# Patient Record
Sex: Male | Born: 1975 | Race: White | Hispanic: No | Smoking: Current some day smoker
Health system: Southern US, Community
[De-identification: ages and names within clinical notes are randomized; demographics above are authoritative.]

## PROBLEM LIST (undated history)

## (undated) DIAGNOSIS — I639 Cerebral infarction, unspecified: Secondary | ICD-10-CM

## (undated) DIAGNOSIS — F419 Anxiety disorder, unspecified: Secondary | ICD-10-CM

## (undated) DIAGNOSIS — I10 Essential (primary) hypertension: Secondary | ICD-10-CM

## (undated) DIAGNOSIS — G809 Cerebral palsy, unspecified: Secondary | ICD-10-CM

## (undated) HISTORY — PX: TONSILLECTOMY: SUR1361

---

## 2004-09-22 ENCOUNTER — Emergency Department: Payer: Self-pay | Admitting: Emergency Medicine

## 2012-11-05 ENCOUNTER — Emergency Department: Payer: Self-pay | Admitting: Internal Medicine

## 2012-11-05 LAB — RAPID INFLUENZA A&B ANTIGENS

## 2013-02-20 ENCOUNTER — Emergency Department: Payer: Self-pay | Admitting: Emergency Medicine

## 2013-12-18 ENCOUNTER — Emergency Department: Payer: Self-pay | Admitting: Emergency Medicine

## 2014-01-02 ENCOUNTER — Emergency Department: Payer: Self-pay | Admitting: Emergency Medicine

## 2014-01-02 LAB — COMPREHENSIVE METABOLIC PANEL
ALBUMIN: 3.9 g/dL (ref 3.4–5.0)
ANION GAP: 3 — AB (ref 7–16)
Alkaline Phosphatase: 78 U/L
BUN: 15 mg/dL (ref 7–18)
Bilirubin,Total: 0.4 mg/dL (ref 0.2–1.0)
CALCIUM: 8.3 mg/dL — AB (ref 8.5–10.1)
Chloride: 108 mmol/L — ABNORMAL HIGH (ref 98–107)
Co2: 27 mmol/L (ref 21–32)
Creatinine: 0.99 mg/dL (ref 0.60–1.30)
EGFR (African American): >60
GLUCOSE: 110 mg/dL — AB (ref 65–99)
OSMOLALITY: 277 (ref 275–301)
Potassium: 3.5 mmol/L (ref 3.5–5.1)
SGOT(AST): 30 U/L (ref 15–37)
SGPT (ALT): 65 U/L (ref 12–78)
SODIUM: 138 mmol/L (ref 136–145)
Total Protein: 7.9 g/dL (ref 6.4–8.2)

## 2014-01-02 LAB — LIPASE, BLOOD: Lipase: 136 U/L (ref 73–393)

## 2014-01-02 LAB — CBC WITH DIFFERENTIAL/PLATELET
Basophil #: 0.5 10*3/uL — ABNORMAL HIGH (ref 0.0–0.1)
Basophil %: 8.2 %
EOS PCT: 2.4 %
Eosinophil #: 0.2 10*3/uL (ref 0.0–0.7)
HCT: 44.6 % (ref 40.0–52.0)
HGB: 14.3 g/dL (ref 13.0–18.0)
LYMPHS ABS: 0.8 10*3/uL — AB (ref 1.0–3.6)
Lymphocyte %: 12.7 %
MCH: 27.8 pg (ref 26.0–34.0)
MCHC: 32.1 g/dL (ref 32.0–36.0)
MCV: 87 fL (ref 80–100)
Monocyte #: 0.7 x10 3/mm (ref 0.2–1.0)
Monocyte %: 10.7 %
NEUTROS PCT: 66 %
Neutrophil #: 4.4 10*3/uL (ref 1.4–6.5)
PLATELETS: 220 10*3/uL (ref 150–440)
RBC: 5.15 10*6/uL (ref 4.40–5.90)
RDW: 13.6 % (ref 11.5–14.5)
WBC: 6.6 10*3/uL (ref 3.8–10.6)

## 2014-01-02 LAB — URINALYSIS, COMPLETE
BLOOD: NEGATIVE
Bacteria: NONE SEEN
Bilirubin,UR: NEGATIVE
GLUCOSE, UR: NEGATIVE mg/dL (ref 0–75)
KETONE: NEGATIVE
Leukocyte Esterase: NEGATIVE
Nitrite: NEGATIVE
Ph: 5 (ref 4.5–8.0)
Protein: NEGATIVE
RBC,UR: 1 /HPF (ref 0–5)
SQUAMOUS EPITHELIAL: NONE SEEN
Specific Gravity: 1.026 (ref 1.003–1.030)
WBC UR: 1 /HPF (ref 0–5)

## 2014-01-02 LAB — TROPONIN I
Troponin-I: <0.02 ng/mL
Troponin-I: <0.02 ng/mL

## 2014-05-21 DIAGNOSIS — I152 Hypertension secondary to endocrine disorders: Secondary | ICD-10-CM | POA: Insufficient documentation

## 2014-05-21 DIAGNOSIS — E1159 Type 2 diabetes mellitus with other circulatory complications: Secondary | ICD-10-CM | POA: Insufficient documentation

## 2014-10-31 DIAGNOSIS — E669 Obesity, unspecified: Secondary | ICD-10-CM | POA: Insufficient documentation

## 2015-04-22 ENCOUNTER — Encounter: Payer: Self-pay | Admitting: *Deleted

## 2015-04-22 ENCOUNTER — Emergency Department
Admission: EM | Admit: 2015-04-22 | Discharge: 2015-04-22 | Disposition: A | Discharge status: Home / Self Care | Payer: Medicare Other | Attending: Emergency Medicine | Admitting: Emergency Medicine

## 2015-04-22 DIAGNOSIS — I10 Essential (primary) hypertension: Secondary | ICD-10-CM | POA: Diagnosis not present

## 2015-04-22 DIAGNOSIS — Z87891 Personal history of nicotine dependence: Secondary | ICD-10-CM | POA: Diagnosis not present

## 2015-04-22 DIAGNOSIS — F419 Anxiety disorder, unspecified: Secondary | ICD-10-CM | POA: Insufficient documentation

## 2015-04-22 DIAGNOSIS — R1030 Lower abdominal pain, unspecified: Secondary | ICD-10-CM | POA: Diagnosis present

## 2015-04-22 HISTORY — DX: Essential (primary) hypertension: I10

## 2015-04-22 HISTORY — DX: Anxiety disorder, unspecified: F41.9

## 2015-04-22 LAB — URINALYSIS COMPLETE WITH MICROSCOPIC (ARMC ONLY)
BACTERIA UA: NONE SEEN
Bilirubin Urine: NEGATIVE
Glucose, UA: NEGATIVE mg/dL
Hgb urine dipstick: NEGATIVE
Ketones, ur: NEGATIVE mg/dL
Leukocytes, UA: NEGATIVE
Nitrite: NEGATIVE
PH: 5 (ref 5.0–8.0)
Protein, ur: NEGATIVE mg/dL
SPECIFIC GRAVITY, URINE: 1.014 (ref 1.005–1.030)
SQUAMOUS EPITHELIAL / LPF: NONE SEEN

## 2015-04-22 LAB — COMPREHENSIVE METABOLIC PANEL
ALBUMIN: 4.5 g/dL (ref 3.5–5.0)
ALK PHOS: 63 U/L (ref 38–126)
ALT: 31 U/L (ref 17–63)
AST: 19 U/L (ref 15–41)
Anion gap: 7 (ref 5–15)
BILIRUBIN TOTAL: 0.5 mg/dL (ref 0.3–1.2)
BUN: 17 mg/dL (ref 6–20)
CALCIUM: 9.5 mg/dL (ref 8.9–10.3)
CO2: 26 mmol/L (ref 22–32)
Chloride: 103 mmol/L (ref 101–111)
Creatinine, Ser: 0.77 mg/dL (ref 0.61–1.24)
GFR calc non Af Amer: >60 mL/min (ref >60)
Glucose, Bld: 112 mg/dL — ABNORMAL HIGH (ref 65–99)
Potassium: 3.9 mmol/L (ref 3.5–5.1)
Sodium: 136 mmol/L (ref 135–145)
Total Protein: 8.2 g/dL — ABNORMAL HIGH (ref 6.5–8.1)

## 2015-04-22 LAB — CBC WITH DIFFERENTIAL/PLATELET
BASOS PCT: 1 %
Basophils Absolute: 0.1 10*3/uL (ref 0–0.1)
Eosinophils Absolute: 0.2 10*3/uL (ref 0–0.7)
Eosinophils Relative: 2 %
HEMATOCRIT: 45.7 % (ref 40.0–52.0)
Hemoglobin: 14.9 g/dL (ref 13.0–18.0)
LYMPHS PCT: 22 %
Lymphs Abs: 2 10*3/uL (ref 1.0–3.6)
MCH: 28.2 pg (ref 26.0–34.0)
MCHC: 32.6 g/dL (ref 32.0–36.0)
MCV: 86.5 fL (ref 80.0–100.0)
Monocytes Absolute: 1.1 10*3/uL — ABNORMAL HIGH (ref 0.2–1.0)
Monocytes Relative: 12 %
NEUTROS PCT: 63 %
Neutro Abs: 5.7 10*3/uL (ref 1.4–6.5)
Platelets: 262 10*3/uL (ref 150–440)
RBC: 5.28 MIL/uL (ref 4.40–5.90)
RDW: 14.2 % (ref 11.5–14.5)
WBC: 9 10*3/uL (ref 3.8–10.6)

## 2015-04-22 LAB — LIPASE, BLOOD: LIPASE: 34 U/L (ref 22–51)

## 2015-04-22 MED ORDER — TRAMADOL HCL 50 MG PO TABS
100.0000 mg | ORAL_TABLET | Freq: Once | ORAL | Status: AC
  Administered 2015-04-22: 100 mg via ORAL

## 2015-04-22 MED ORDER — TRAMADOL HCL 50 MG PO TABS: 50.0000 mg | ORAL_TABLET | Freq: Four times a day (QID) | ORAL | Status: AC | PRN

## 2015-04-22 MED ORDER — TRAMADOL HCL 50 MG PO TABS
ORAL_TABLET | ORAL | Status: AC
  Administered 2015-04-22: 100 mg via ORAL
  Filled 2015-04-22: qty 2

## 2015-04-22 NOTE — ED Notes (Signed)
Pain across low abd past 2 days

## 2015-04-22 NOTE — Discharge Instructions (Signed)

## 2015-04-22 NOTE — ED Provider Notes (Signed)
[facility] Emergency Department Provider Note  Time seen: 2:52 PM  I have reviewed the triage vital signs and the nursing notes.   HISTORY  Chief Complaint Abdominal Pain    HPI Bradley Morrison is a 39 y.o. male with a past medical history of hypertension and anxiety presents the emergency department lower abdominal pain 2 days. According to the patient for the past 2 days he has had progressively worsening lower abdominal pain he says it stretches from right to left. Denies any diarrhea, fever, nausea, vomiting. He states he had a similar pain previously but it went away after a few days. He did not seek medical interventions/workup at that time. Patient denies any dysuria, pain with urination, bloody or black stool. Describes the pain as moderate in severity, dull/aching quality, no known modifying factors that he could identify.    Past Medical History  Diagnosis Date  . Hypertension   . Anxiety     Patient Active Problem List   Diagnosis Date Noted  . Anxiety     History reviewed. No pertinent past surgical history.  No current outpatient prescriptions on file.  Allergies Review of patient's allergies indicates no known allergies.  No family history on file.  Social History History  Substance Use Topics  . Smoking status: Former Games developermoker  . Smokeless tobacco: Not on file  . Alcohol Use: No    Review of Systems Constitutional: Negative for fever. Cardiovascular: Negative for chest pain. Respiratory: Negative for shortness of breath. Gastrointestinal: Positive for lower abdominal pain. Negative for nausea, vomiting, diarrhea. Genitourinary: Negative for dysuria. Negative for hematuria. Musculoskeletal: Negative for back pain. Skin: Negative for rash.  10-point ROS otherwise negative.  ____________________________________________   PHYSICAL EXAM:  VITAL SIGNS: ED Triage Vitals  Enc Vitals Group     BP 04/22/15 1300 139/82 mmHg      Pulse Rate 04/22/15 1300 84     Resp 04/22/15 1300 20     Temp 04/22/15 1300 98.1 F (36.7 C)     Temp Source 04/22/15 1300 Oral     SpO2 04/22/15 1300 96 %     Weight 04/22/15 1300 230 lb (104.327 kg)     Height 04/22/15 1300 5\' 6"  (1.676 m)     Head Cir --      Peak Flow --      Pain Score 04/22/15 1301 8     Pain Loc --      Pain Edu? --      Excl. in GC? --     Constitutional: Alert and oriented. Well appearing and in no distress. ENT   Mouth/Throat: Mucous membranes are moist. Cardiovascular: Normal rate, regular rhythm. No murmur Respiratory: Normal respiratory effort without tachypnea nor retractions. Breath sounds are clear and equal bilaterally.  Gastrointestinal: Mild lower abdominal tenderness palpation, left lower quadrant greater than right lower quadrant. No rebound or guarding. No distention. Musculoskeletal: Nontender with normal range of motion in all extremities.  Neurologic:  Normal speech and language. No gross focal neurologic deficits are appreciated. Speech is normal. Skin:  Skin is warm, dry and intact.  Psychiatric: Mood and affect are normal. Speech and behavior are normal.   ____________________________________________    INITIAL IMPRESSION / ASSESSMENT AND PLAN / ED COURSE  Pertinent labs & imaging results that were available during my care of the patient were reviewed by me and considered in my medical decision making (see chart for details).  Patient with lower abdominal pain 2 days.  Vitals are within normal limits, labs are within normal limits, we will check a urinalysis to help further evaluate. Overall patient appears very well. I discussed with the patient the option of obtaining a CT scan today, versus going home and following up with his primary care doctor, or if symptoms worsen over the next 24 hours or do not improve within 48 hours to return to the emergency department for a CT scan.  Labs are within normal limits, urinalysis  within normal limits. Patient states he would rather avoid a CT scan at this time, and wishes to give it another 24-48 hours to see if his symptoms are resolving. I discussed with the patient very strict return precautions for fever, worsening abdominal pain, diarrhea especially bloody or black diarrhea, the patient is agreeable to this plan. We will discharge the patient home at this time.  ____________________________________________   FINAL CLINICAL IMPRESSION(S) / ED DIAGNOSES  Lower abdominal pain   Minna Antis, MD 04/22/15 1550

## 2016-07-04 ENCOUNTER — Emergency Department
Admission: EM | Admit: 2016-07-04 | Discharge: 2016-07-04 | Disposition: A | Discharge status: Home / Self Care | Payer: Medicare Other | Attending: Emergency Medicine | Admitting: Emergency Medicine

## 2016-07-04 ENCOUNTER — Encounter: Payer: Self-pay | Admitting: Emergency Medicine

## 2016-07-04 DIAGNOSIS — I10 Essential (primary) hypertension: Secondary | ICD-10-CM | POA: Insufficient documentation

## 2016-07-04 DIAGNOSIS — L03116 Cellulitis of left lower limb: Secondary | ICD-10-CM | POA: Diagnosis not present

## 2016-07-04 DIAGNOSIS — W57XXXA Bitten or stung by nonvenomous insect and other nonvenomous arthropods, initial encounter: Secondary | ICD-10-CM

## 2016-07-04 DIAGNOSIS — T63301A Toxic effect of unspecified spider venom, accidental (unintentional), initial encounter: Secondary | ICD-10-CM | POA: Diagnosis present

## 2016-07-04 HISTORY — DX: Cerebral palsy, unspecified: G80.9

## 2016-07-04 MED ORDER — CLINDAMYCIN HCL 150 MG PO CAPS
300.0000 mg | ORAL_CAPSULE | Freq: Once | ORAL | Status: AC
  Administered 2016-07-04: 300 mg via ORAL
  Filled 2016-07-04: qty 2

## 2016-07-04 MED ORDER — KETOROLAC TROMETHAMINE 60 MG/2ML IM SOLN
60.0000 mg | Freq: Once | INTRAMUSCULAR | Status: AC
  Administered 2016-07-04: 60 mg via INTRAMUSCULAR
  Filled 2016-07-04: qty 2

## 2016-07-04 MED ORDER — CLINDAMYCIN HCL 300 MG PO CAPS: 300.0000 mg | ORAL_CAPSULE | Freq: Three times a day (TID) | ORAL | 0 refills | Status: AC

## 2016-07-04 MED ORDER — TRAMADOL HCL 50 MG PO TABS: 50.0000 mg | ORAL_TABLET | Freq: Four times a day (QID) | ORAL | 0 refills | Status: DC | PRN

## 2016-07-04 NOTE — ED Provider Notes (Signed)
[facility] Emergency Department Provider Note   ____________________________________________   First MD Initiated Contact with Patient 07/04/16 [phone]     (approximate)  I have reviewed the triage vital signs and the nursing notes.   HISTORY  Chief Complaint Insect Bite    HPI Bradley Morrison is a 40 y.o. male who comes into the hospital today with a concern about a spider bite. He reports that he has an area on his left hip that's red with a black center. He reports that he noticed it first yesterday or the day before. The patient reports he thought it was a pimple but then it got bigger. He tried to squeeze the area but no fluid came out of it. He reports that currently a pretty sore. The patient rates pain a 4 out of 10 in intensity. He denies any fevers, nausea, vomiting. He denies any leg numbness or weakness. He does have some discomfort in his left groin as well which was concerning. The patient decided to come into the hospital today for evaluation.   Past Medical History:  Diagnosis Date  . Anxiety   . CP (cerebral palsy) (HCC)   . Hypertension     Patient Active Problem List   Diagnosis Date Noted  . Anxiety     Past Surgical History:  Procedure Laterality Date  . TONSILLECTOMY      Prior to Admission medications   Medication Sig Start Date End Date Taking? Authorizing Provider  clindamycin (CLEOCIN) 300 MG capsule Take 1 capsule (300 mg total) by mouth 3 (three) times daily. 07/04/16 07/14/16  Rebecka Apley, MD  traMADol (ULTRAM) 50 MG tablet Take 1 tablet (50 mg total) by mouth every 6 (six) hours as needed. 07/04/16   Rebecka Apley, MD    Allergies Review of patient's allergies indicates no known allergies.  History reviewed. No pertinent family history.  Social History Social History  Substance Use Topics  . Smoking status: Current Every Day Smoker    Packs/day: 0.50    Types: Cigarettes  . Smokeless tobacco: Never  Used  . Alcohol use No    Review of Systems Constitutional: No fever/chills Eyes: No visual changes. ENT: No sore throat. Cardiovascular: Denies chest pain. Respiratory: Denies shortness of breath. Gastrointestinal: No abdominal pain.  No nausea, no vomiting.  No diarrhea.  No constipation. Genitourinary: Negative for dysuria. Musculoskeletal: Negative for back pain. Skin: Red area to left hip Neurological: Negative for headaches, focal weakness or numbness.  10-point ROS otherwise negative.  ____________________________________________   PHYSICAL EXAM:  VITAL SIGNS: ED Triage Vitals  Enc Vitals Group     BP 07/04/16 0603 131/82     Pulse Rate 07/04/16 0603 85     Resp 07/04/16 0603 18     Temp 07/04/16 0603 97.8 F (36.6 C)     Temp Source 07/04/16 0603 Oral     SpO2 07/04/16 0603 95 %     Weight 07/04/16 0603 250 lb (113.4 kg)     Height 07/04/16 0603 5\' 6"  (1.676 m)     Head Circumference --      Peak Flow --      Pain Score 07/04/16 0604 4     Pain Loc --      Pain Edu? --      Excl. in GC? --     Constitutional: Alert and oriented. Well appearing and in no acute distress. Eyes: Conjunctivae are normal. PERRL. EOMI. Head: Atraumatic. Nose:  No congestion/rhinnorhea. Mouth/Throat: Mucous membranes are moist.  Oropharynx non-erythematous. Lymph: Left inguinal lymphadenopathy. Cardiovascular: Normal rate, regular rhythm. Grossly normal heart sounds.  Good peripheral circulation. Respiratory: Normal respiratory effort.  No retractions. Lungs CTAB. Gastrointestinal: Soft and nontender. No distention. Positive bowel sounds Musculoskeletal: No lower extremity tenderness nor edema.  Neurologic:  Normal speech and language.  Skin:  Skin is warm, dry and intact. Left hip with 3.5 x 3.5 circumscribed area of erythema with a central black eschar. There is no area of fluctuance but some mild induration. There is some mild tenderness to palpation. Psychiatric: Mood and  affect are normal. Speech and behavior are normal.  ____________________________________________   LABS (all labs ordered are listed, but only abnormal results are displayed)  Labs Reviewed - No data to display ____________________________________________  EKG  none ____________________________________________  RADIOLOGY  none ____________________________________________   PROCEDURES  Procedure(s) performed: None  Procedures  Critical Care performed: No  ____________________________________________   INITIAL IMPRESSION / ASSESSMENT AND PLAN / ED COURSE  Pertinent labs & imaging results that were available during my care of the patient were reviewed by me and considered in my medical decision making (see chart for details).  This is a 40 year old male who comes into the hospital today with an area of redness on his left hip with a concern for a spider bite. The patient does have an area of erythema and warmth with some central black eschar. There is no abscess but it does appear that the patient has some cellulitis. I will give the patient a shot of Toradol as well as a dose of clindamycin. He will be discharged home with clindamycin. I explained to the patient that should he develop any fevers, nausea, vomiting or worsened redness need to return to the emergency department for IV antibiotics and further evaluation. The patient does understand. He'll be discharged home.  Clinical Course     ____________________________________________   FINAL CLINICAL IMPRESSION(S) / ED DIAGNOSES  Final diagnoses:  Cellulitis of left lower extremity  Insect bite      NEW MEDICATIONS STARTED DURING THIS VISIT:  New Prescriptions   CLINDAMYCIN (CLEOCIN) 300 MG CAPSULE    Take 1 capsule (300 mg total) by mouth 3 (three) times daily.   TRAMADOL (ULTRAM) 50 MG TABLET    Take 1 tablet (50 mg total) by mouth every 6 (six) hours as needed.     Note:  This document was prepared  using Dragon voice recognition software and may include unintentional dictation errors.    Rebecka ApleyAllison P Webster, MD 07/04/16 [phone]

## 2016-07-04 NOTE — ED Notes (Signed)
Pt c/o possible insect bite to left hip that occurred Friday evening. Pt reports he did not feel bite or see insect, he started to feel pain at site. Pt has 2" area of warm/raised/redness surrounding 1/8 dark brown bite/puncture.

## 2016-07-04 NOTE — ED Notes (Signed)
MD Webster at bedside 

## 2016-07-04 NOTE — ED Triage Notes (Signed)
Pt presents to ED with c/o possible spider bite to his left hip. Pt states he noticed reddned area a couple of days ago. Today noticed increase in pain, swelling and reports now has a blackened center. Pt states he attempted to "pop" it without success. Denies drainage.

## 2016-07-06 ENCOUNTER — Emergency Department: Payer: Medicare Other

## 2016-07-06 DIAGNOSIS — Z792 Long term (current) use of antibiotics: Secondary | ICD-10-CM | POA: Diagnosis not present

## 2016-07-06 DIAGNOSIS — I10 Essential (primary) hypertension: Secondary | ICD-10-CM | POA: Diagnosis not present

## 2016-07-06 DIAGNOSIS — F17210 Nicotine dependence, cigarettes, uncomplicated: Secondary | ICD-10-CM | POA: Diagnosis not present

## 2016-07-06 DIAGNOSIS — L03116 Cellulitis of left lower limb: Secondary | ICD-10-CM | POA: Diagnosis not present

## 2016-07-06 DIAGNOSIS — T63301D Toxic effect of unspecified spider venom, accidental (unintentional), subsequent encounter: Secondary | ICD-10-CM | POA: Insufficient documentation

## 2016-07-06 NOTE — ED Triage Notes (Signed)
Pt presents to ED for c/o s/x's related to a possible insect/spider bite. Pt was seen here Saturday for same and prescribed ABX. Pt reports taking medications as prescribed but s/x's are not improving. Pt denies any fevers; denies any N/V/D, SHOB or CP.

## 2016-07-07 ENCOUNTER — Emergency Department
Admission: EM | Admit: 2016-07-07 | Discharge: 2016-07-07 | Disposition: A | Discharge status: Home / Self Care | Payer: Medicare Other | Attending: Emergency Medicine | Admitting: Emergency Medicine

## 2016-07-07 DIAGNOSIS — L03116 Cellulitis of left lower limb: Secondary | ICD-10-CM | POA: Diagnosis not present

## 2016-07-07 DIAGNOSIS — T63301D Toxic effect of unspecified spider venom, accidental (unintentional), subsequent encounter: Secondary | ICD-10-CM

## 2016-07-07 MED ORDER — CEPHALEXIN 500 MG PO CAPS: 500.0000 mg | ORAL_CAPSULE | Freq: Three times a day (TID) | ORAL | 0 refills | Status: DC

## 2016-07-07 MED ORDER — CEPHALEXIN 500 MG PO CAPS
500.0000 mg | ORAL_CAPSULE | Freq: Once | ORAL | Status: AC
  Administered 2016-07-07: 500 mg via ORAL
  Filled 2016-07-07: qty 1

## 2016-07-07 NOTE — Discharge Instructions (Signed)
1. Continue clindamycin. Add Keflex 500 mg 3 times daily 7 days. 2. Apply moist warm compresses to affected area several times daily. 3. Return to the ER for worsening symptoms, fever, persistent vomiting or other concerns.

## 2016-07-07 NOTE — ED Notes (Signed)
Discharge instructions reviewed with patient. Patient verbalized understanding. Patient ambulated to lobby without difficulty.   

## 2016-07-07 NOTE — ED Provider Notes (Signed)
[facility] Emergency Department Provider Note   ____________________________________________   First MD Initiated Contact with Patient 07/07/16 0139     (approximate)  I have reviewed the triage vital signs and the nursing notes.   HISTORY  Chief Complaint Cellulitis    HPI Bradley Morrison is a 40 y.o. male who returns to the ED from home for wound check. Patient was seen 3 nights ago in the ED for possible spider bite to left hip. Reports he is taking clindamycin but states the area has grown larger. Denies fever, chills, chest pain, shortness of breath, abdominal pain, nausea, vomiting, diarrhea. States he is having some drainage from the affected area.Reports partial relief with tramadol.   Past Medical History:  Diagnosis Date  . Anxiety   . CP (cerebral palsy) (HCC)   . Hypertension     Patient Active Problem List   Diagnosis Date Noted  . Anxiety     Past Surgical History:  Procedure Laterality Date  . TONSILLECTOMY      Prior to Admission medications   Medication Sig Start Date End Date Taking? Authorizing Provider  cephALEXin (KEFLEX) 500 MG capsule Take 1 capsule (500 mg total) by mouth 3 (three) times daily. 07/07/16   Irean Hong, MD  clindamycin (CLEOCIN) 300 MG capsule Take 1 capsule (300 mg total) by mouth 3 (three) times daily. 07/04/16 07/14/16  Rebecka Apley, MD  traMADol (ULTRAM) 50 MG tablet Take 1 tablet (50 mg total) by mouth every 6 (six) hours as needed. 07/04/16   Rebecka Apley, MD    Allergies Review of patient's allergies indicates no known allergies.  No family history on file.  Social History Social History  Substance Use Topics  . Smoking status: Current Every Day Smoker    Packs/day: 0.50    Types: Cigarettes  . Smokeless tobacco: Never Used  . Alcohol use No    Review of Systems  Constitutional: No fever/chills. Eyes: No visual changes. ENT: No sore throat. Cardiovascular: Denies chest  pain. Respiratory: Denies shortness of breath. Gastrointestinal: No abdominal pain.  No nausea, no vomiting.  No diarrhea.  No constipation. Genitourinary: Negative for dysuria. Musculoskeletal: Negative for back pain. Skin: Positive for possible spider bite and area of cellulitis to left hip. Negative for rash. Neurological: Negative for headaches, focal weakness or numbness.  10-point ROS otherwise negative.  ____________________________________________   PHYSICAL EXAM:  VITAL SIGNS: ED Triage Vitals  Enc Vitals Group     BP 07/06/16 2214 131/87     Pulse Rate 07/06/16 2214 88     Resp 07/06/16 2214 16     Temp 07/06/16 2214 98.4 F (36.9 C)     Temp Source 07/06/16 2214 Oral     SpO2 07/06/16 2214 96 %     Weight 07/06/16 2215 250 lb (113.4 kg)     Height 07/06/16 2215 5\' 6"  (1.676 m)     Head Circumference --      Peak Flow --      Pain Score 07/06/16 2215 6     Pain Loc --      Pain Edu? --      Excl. in GC? --     Constitutional: Alert and oriented. Well appearing and in no acute distress. Eyes: Conjunctivae are normal. PERRL. EOMI. Head: Atraumatic. Nose: No congestion/rhinnorhea. Mouth/Throat: Mucous membranes are moist.  Oropharynx non-erythematous. Neck: No stridor.  Cardiovascular: Normal rate, regular rhythm. Grossly normal heart sounds.  Good peripheral  circulation. Respiratory: Normal respiratory effort.  No retractions. Lungs CTAB. Gastrointestinal: Soft and nontender. No distention. No abdominal bruits. No CVA tenderness. Musculoskeletal: No lower extremity tenderness nor edema.  No joint effusions. Neurologic:  Normal speech and language. No gross focal neurologic deficits are appreciated. No gait instability. Skin:  8.5x5.5cm area of erythema surrounding a central 1cm ulceration with overlying scab. Some drainage noted. There is no fluctuance. Skin is warm, dry and intact. No rash noted. Psychiatric: Mood and affect are normal. Speech and behavior are  normal.  ____________________________________________   LABS (all labs ordered are listed, but only abnormal results are displayed)  Labs Reviewed - No data to display ____________________________________________  EKG  None ____________________________________________  RADIOLOGY  Left hip x-rays (reviewed by me, interpreted per Dr. Cherly Hensenhang): No evidence of fracture or dislocation. ____________________________________________   PROCEDURES  Procedure(s) performed: None  Procedures  Critical Care performed: No  ____________________________________________   INITIAL IMPRESSION / ASSESSMENT AND PLAN / ED COURSE  Pertinent labs & imaging results that were available during my care of the patient were reviewed by me and considered in my medical decision making (see chart for details).  40 year old male on clindamycin for presumed spider bite with surrounding cellulitis who presents for enlarging area of erythema. Otherwise he is not having systemic effects such as fever, chills, nausea or vomiting. Will add Keflex and patient will have wound recheck in 2 days. Turn precautions given. Patient verbalizes understanding and agrees with plan of care.  Clinical Course     ____________________________________________   FINAL CLINICAL IMPRESSION(S) / ED DIAGNOSES  Final diagnoses:  Cellulitis of left lower extremity  Spider bite, accidental or unintentional, subsequent encounter      NEW MEDICATIONS STARTED DURING THIS VISIT:  Discharge Medication List as of 07/07/2016  2:06 AM    START taking these medications   Details  cephALEXin (KEFLEX) 500 MG capsule Take 1 capsule (500 mg total) by mouth 3 (three) times daily., Starting Tue 07/07/2016, Print         Note:  This document was prepared using Dragon voice recognition software and may include unintentional dictation errors.    Irean HongJade J Sung, MD 07/07/16 [phone]

## 2016-07-09 ENCOUNTER — Emergency Department
Admission: EM | Admit: 2016-07-09 | Discharge: 2016-07-09 | Disposition: A | Discharge status: Home / Self Care | Payer: Medicare Other | Attending: Emergency Medicine | Admitting: Emergency Medicine

## 2016-07-09 ENCOUNTER — Encounter: Payer: Self-pay | Admitting: Emergency Medicine

## 2016-07-09 DIAGNOSIS — I10 Essential (primary) hypertension: Secondary | ICD-10-CM | POA: Insufficient documentation

## 2016-07-09 DIAGNOSIS — F17210 Nicotine dependence, cigarettes, uncomplicated: Secondary | ICD-10-CM | POA: Insufficient documentation

## 2016-07-09 DIAGNOSIS — Z5189 Encounter for other specified aftercare: Secondary | ICD-10-CM

## 2016-07-09 DIAGNOSIS — Z4800 Encounter for change or removal of nonsurgical wound dressing: Secondary | ICD-10-CM | POA: Insufficient documentation

## 2016-07-09 MED ORDER — RANITIDINE HCL 150 MG PO TABS: 150.0000 mg | ORAL_TABLET | Freq: Two times a day (BID) | ORAL | 0 refills | Status: DC

## 2016-07-09 NOTE — ED Provider Notes (Signed)
[facility] Emergency Department Provider Note  ____________________________________________  Time seen: Approximately 7:51 AM  I have reviewed the triage vital signs and the nursing notes.   HISTORY  Chief Complaint Wound Check    HPI Bradley Morrison is a 40 y.o. male presents for evaluation of wound check to his left hip. Patient was seen twice in the last week placed on clindamycin added doxycycline and tramadol. We complaint is upset stomach sometimes when taking medications. Denies any change in bowel habits denies any diarrhea or loose stools. States wound appears to be healing well.    Past Medical History:  Diagnosis Date  . Anxiety   . CP (cerebral palsy) (HCC)   . Hypertension     Patient Active Problem List   Diagnosis Date Noted  . Anxiety     Past Surgical History:  Procedure Laterality Date  . TONSILLECTOMY      Prior to Admission medications   Medication Sig Start Date End Date Taking? Authorizing Provider  cephALEXin (KEFLEX) 500 MG capsule Take 1 capsule (500 mg total) by mouth 3 (three) times daily. 07/07/16   Irean Hong, MD  clindamycin (CLEOCIN) 300 MG capsule Take 1 capsule (300 mg total) by mouth 3 (three) times daily. 07/04/16 07/14/16  Rebecka Apley, MD  ranitidine (ZANTAC) 150 MG tablet Take 1 tablet (150 mg total) by mouth 2 (two) times daily. 07/09/16   Charmayne Sheer Roberta Angell, PA-C  traMADol (ULTRAM) 50 MG tablet Take 1 tablet (50 mg total) by mouth every 6 (six) hours as needed. 07/04/16   Rebecka Apley, MD    Allergies Review of patient's allergies indicates no known allergies.  No family history on file.  Social History Social History  Substance Use Topics  . Smoking status: Current Every Day Smoker    Packs/day: 0.50    Types: Cigarettes  . Smokeless tobacco: Never Used  . Alcohol use No    Review of Systems Constitutional: No fever/chills Skin: Positive for cellulitis and wound to the left  hip Neurological: Negative for headaches, focal weakness or numbness.  10-point ROS otherwise negative.  ____________________________________________   PHYSICAL EXAM: BP 134/81 (BP Location: Left Arm)   Pulse 82   Temp 98.5 F (36.9 C) (Oral)   Resp 18   SpO2 95%   VITAL SIGNS: ED Triage Vitals  Enc Vitals Group     BP      Pulse      Resp      Temp      Temp src      SpO2      Weight      Height      Head Circumference      Peak Flow      Pain Score      Pain Loc      Pain Edu?      Excl. in GC?     Constitutional: Alert and oriented. Well appearing and in no acute distress. Neurologic:  Normal speech and language. No gross focal neurologic deficits are appreciated. No gait instability. Skin:  Skin is warm, dry To the left outer hip With an area of resolving erythema based on previous marks from 2 days ago. No active drainage noted. Minimal tenderness. Psychiatric: Mood and affect are normal. Speech and behavior are normal.  ____________________________________________   LABS (all labs ordered are listed, but only abnormal results are displayed)  Labs Reviewed - No data to display ____________________________________________  EKG  ____________________________________________  RADIOLOGY   ____________________________________________   PROCEDURES  Procedure(s) performed: None  Critical Care performed: No  ____________________________________________   INITIAL IMPRESSION / ASSESSMENT AND PLAN / ED COURSE  Pertinent labs & imaging results that were available during my care of the patient were reviewed by me and considered in my medical decision making (see chart for details). Review of the [place] CSRS was performed in accordance of the [facility] prior to dispensing any controlled drugs.  Cellulitis with wound check to left hip. Appears to be resolving Rx given for Zantac 150 milligrams twice a day to eat in abdominal discomfort. CP return to ER in 2 days  for final wound check.  Clinical Course    ____________________________________________   FINAL CLINICAL IMPRESSION(S) / ED DIAGNOSES  Final diagnoses:  Visit for wound check  Wound check, abscess     This chart was dictated using voice recognition software/Dragon. Despite best efforts to proofread, errors can occur which can change the meaning. Any change was purely unintentional.    Evangeline Dakinharles M Estefanie Cornforth, PA-C 07/09/16 0833    Evangeline Dakinharles M Grayland Daisey, PA-C 07/09/16 1046    Myrna Blazeravid Matthew Schaevitz, MD 07/09/16 [phone]

## 2016-07-09 NOTE — ED Triage Notes (Signed)
Pt here for recheck of spider bite to left hip.

## 2018-02-22 ENCOUNTER — Other Ambulatory Visit: Payer: Self-pay

## 2018-02-22 ENCOUNTER — Emergency Department
Admission: EM | Admit: 2018-02-22 | Discharge: 2018-02-22 | Disposition: A | Discharge status: Home / Self Care | Payer: Medicare Other | Attending: Emergency Medicine | Admitting: Emergency Medicine

## 2018-02-22 DIAGNOSIS — B9789 Other viral agents as the cause of diseases classified elsewhere: Secondary | ICD-10-CM | POA: Diagnosis not present

## 2018-02-22 DIAGNOSIS — F17210 Nicotine dependence, cigarettes, uncomplicated: Secondary | ICD-10-CM | POA: Insufficient documentation

## 2018-02-22 DIAGNOSIS — J028 Acute pharyngitis due to other specified organisms: Secondary | ICD-10-CM | POA: Diagnosis not present

## 2018-02-22 DIAGNOSIS — I10 Essential (primary) hypertension: Secondary | ICD-10-CM | POA: Diagnosis not present

## 2018-02-22 DIAGNOSIS — J029 Acute pharyngitis, unspecified: Secondary | ICD-10-CM | POA: Diagnosis present

## 2018-02-22 LAB — GROUP A STREP BY PCR: GROUP A STREP BY PCR: NOT DETECTED

## 2018-02-22 MED ORDER — PSEUDOEPH-BROMPHEN-DM 30-2-10 MG/5ML PO SYRP: 5.0000 mL | ORAL_SOLUTION | Freq: Four times a day (QID) | ORAL | 0 refills | Status: DC | PRN

## 2018-02-22 NOTE — ED Provider Notes (Signed)
[facility] Emergency Department Provider Note  ___________________________________________   First MD Initiated Contact with Patient 02/22/18 [phone]     (approximate)  I have reviewed the triage vital signs and the nursing notes.   HISTORY  Chief Complaint Sore Throat  HPI Bradley Morrison is a 42 y.o. male is here with complaint of sore throat since yesterday.  Patient denies any fever or chills.  He is unaware of any exposure to strep throat.  He states pain is worse with swallowing.  He continues to eat and drink as normal.  He rates his pain as 5/10.  Past Medical History:  Diagnosis Date  . Anxiety   . CP (cerebral palsy) (HCC)   . Hypertension     Patient Active Problem List   Diagnosis Date Noted  . Anxiety     Past Surgical History:  Procedure Laterality Date  . TONSILLECTOMY      Prior to Admission medications   Medication Sig Start Date End Date Taking? Authorizing Provider  brompheniramine-pseudoephedrine-DM 30-2-10 MG/5ML syrup Take 5 mLs by mouth 4 (four) times daily as needed. 02/22/18   Tommi Rumps, PA-C    Allergies Patient has no known allergies.  No family history on file.  Social History Social History   Tobacco Use  . Smoking status: Current Every Day Smoker    Packs/day: 0.50    Types: Cigarettes  . Smokeless tobacco: Never Used  Substance Use Topics  . Alcohol use: No  . Drug use: No    Review of Systems Constitutional: No fever/chills Eyes: No visual changes. ENT: Positive sore throat. Cardiovascular: Denies chest pain. Respiratory: Denies shortness of breath. Gastrointestinal:   No nausea, no vomiting.  Musculoskeletal: Negative for muscle aches. Skin: Negative for rash. Neurological: Negative for headaches ___________________________________________   PHYSICAL EXAM:  VITAL SIGNS: ED Triage Vitals [02/22/18 0658]  Enc Vitals Group     BP (!) 142/86     Pulse Rate 90     Resp 17     Temp  98.3 F (36.8 C)     Temp Source Oral     SpO2 93 %     Weight 240 lb (108.9 kg)     Height  (1.676 m)     Head Circumference      Peak Flow      Pain Score 5     Pain Loc      Pain Edu?      Excl. in GC?    Constitutional: Alert and oriented. Well appearing and in no acute distress. Eyes: Conjunctivae are normal.  Head: Atraumatic. Nose: Mild congestion/no rhinnorhea. Mouth/Throat: Mucous membranes are moist.  Oropharynx non-erythematous.  Posterior drainage noted. Neck: No stridor.   Hematological/Lymphatic/Immunilogical: No cervical lymphadenopathy. Cardiovascular: Normal rate, regular rhythm. Grossly normal heart sounds.  Good peripheral circulation. Respiratory: Normal respiratory effort.  No retractions. Lungs CTAB. Musculoskeletal: Moves upper and lower extremities without any difficulty.  Normal gait was noted. Neurologic:  Normal speech and language. No gross focal neurologic deficits are appreciated.  Skin:  Skin is warm, dry and intact. No rash noted. Psychiatric: Mood and affect are normal. Speech and behavior are normal.  ____________________________________________   LABS (all labs ordered are listed, but only abnormal results are displayed)  Labs Reviewed  GROUP A STREP BY PCR    PROCEDURES  Procedure(s) performed: None  Procedures  Critical Care performed: No  ____________________________________________   INITIAL IMPRESSION / ASSESSMENT AND PLAN / ED  COURSE  As part of my medical decision making, I reviewed the following data within the electronic MEDICAL RECORD NUMBER Notes from prior ED visits and  Controlled Substance Database  Patient is here with complaint of sore throat.  Patient was reassured that this is not strep as his test was negative.  He was told that this most likely is a viral pharyngitis and will treat symptoms and encourage him to drink fluids.  Patient was also given a prescription for Bromfed-DM 1 teaspoon 4 times daily as  needed for cough and congestion.  He may also take Tylenol or ibuprofen as needed for throat pain.  ____________________________________________   FINAL CLINICAL IMPRESSION(S) / ED DIAGNOSES  Final diagnoses:  Acute viral pharyngitis     ED Discharge Orders        Ordered    brompheniramine-pseudoephedrine-DM 30-2-10 MG/5ML syrup  4 times daily PRN     02/22/18 0805       Note:  This document was prepared using Dragon voice recognition software and may include unintentional dictation errors.    Tommi Rumps, PA-C 02/22/18 1055    Jeanmarie Plant, MD 02/22/18 [phone]

## 2018-02-22 NOTE — ED Triage Notes (Signed)
Pt in with co sore throat since yesterday  

## 2018-02-22 NOTE — Discharge Instructions (Signed)
Follow-up with [facility] acute care or 1 of the clinics listed on your discharge papers.  You should establish a primary care provider at 1 of these clinics.  Call today to make an appointment as it may be 1 to 2 months before you can be seen as a new patient.  Begin taking Bromfed-DM as needed for your nasal congestion and posterior drainage which will also help your throat pain.  Continue taking Tylenol if needed for your throat pain.

## 2018-02-22 NOTE — ED Notes (Signed)
See triage note  Presents with sore throat for couple of days   No fever  Having slight increased pain with swallowing  Afebrile on arrival

## 2018-03-03 ENCOUNTER — Emergency Department: Payer: Medicare Other

## 2018-03-03 ENCOUNTER — Encounter: Payer: Self-pay | Admitting: Emergency Medicine

## 2018-03-03 ENCOUNTER — Emergency Department
Admission: EM | Admit: 2018-03-03 | Discharge: 2018-03-03 | Disposition: A | Discharge status: Home / Self Care | Payer: Medicare Other | Attending: Emergency Medicine | Admitting: Emergency Medicine

## 2018-03-03 ENCOUNTER — Other Ambulatory Visit: Payer: Self-pay

## 2018-03-03 DIAGNOSIS — F419 Anxiety disorder, unspecified: Secondary | ICD-10-CM | POA: Insufficient documentation

## 2018-03-03 DIAGNOSIS — I10 Essential (primary) hypertension: Secondary | ICD-10-CM | POA: Insufficient documentation

## 2018-03-03 DIAGNOSIS — G809 Cerebral palsy, unspecified: Secondary | ICD-10-CM | POA: Insufficient documentation

## 2018-03-03 DIAGNOSIS — L03115 Cellulitis of right lower limb: Secondary | ICD-10-CM | POA: Insufficient documentation

## 2018-03-03 DIAGNOSIS — F17210 Nicotine dependence, cigarettes, uncomplicated: Secondary | ICD-10-CM | POA: Diagnosis not present

## 2018-03-03 DIAGNOSIS — R2241 Localized swelling, mass and lump, right lower limb: Secondary | ICD-10-CM | POA: Diagnosis present

## 2018-03-03 MED ORDER — SULFAMETHOXAZOLE-TRIMETHOPRIM 800-160 MG PO TABS: 1.0000 | ORAL_TABLET | Freq: Two times a day (BID) | ORAL | 0 refills | Status: DC

## 2018-03-03 MED ORDER — NAPROXEN 500 MG PO TABS
500.0000 mg | ORAL_TABLET | Freq: Two times a day (BID) | ORAL | 0 refills | Status: DC
Start: 2018-03-03 — End: 2018-03-06

## 2018-03-03 NOTE — ED Provider Notes (Signed)
[facility] Emergency Department Provider Note  ____________________________________________  Time seen: Approximately 7:37 AM  I have reviewed the triage vital signs and the nursing notes.   HISTORY  Chief Complaint Leg Pain   HPI Bradley Morrison is a 42 y.o. male who presents to the emergency department for treatment and evaluation of right lower extremity pain and swelling. He noticed redness this morning that was not there prior to going to bed last night. He states that he has had intermittent edema over the past week or so, but soreness started 2 days ago, then redness overnight. He has noticed an increase in right calf edema as well. No alleviating measures attempted for this complaint.  Past Medical History:  Diagnosis Date  . Anxiety   . CP (cerebral palsy) (HCC)   . Hypertension     Patient Active Problem List   Diagnosis Date Noted  . Anxiety     Past Surgical History:  Procedure Laterality Date  . TONSILLECTOMY      Prior to Admission medications   Medication Sig Start Date End Date Taking? Authorizing Provider  brompheniramine-pseudoephedrine-DM 30-2-10 MG/5ML syrup Take 5 mLs by mouth 4 (four) times daily as needed. 02/22/18   Tommi Rumps, PA-C  naproxen (NAPROSYN) 500 MG tablet Take 1 tablet (500 mg total) by mouth 2 (two) times daily with a meal. 03/03/18   Kimsey Demaree B, FNP  sulfamethoxazole-trimethoprim (BACTRIM DS,SEPTRA DS) 800-160 MG tablet Take 1 tablet by mouth 2 (two) times daily. 03/03/18   Chinita Pester, FNP    Allergies Patient has no known allergies.  No family history on file.  Social History Social History   Tobacco Use  . Smoking status: Current Every Day Smoker    Packs/day: 0.50    Types: Cigarettes  . Smokeless tobacco: Never Used  Substance Use Topics  . Alcohol use: No  . Drug use: No    Review of Systems  Constitutional: Negative for fever. Respiratory: Negative for cough or shortness  of breath.  Cardiovascular: Negative for chest pain. Musculoskeletal: Positive for myalgias Skin: Positive for calf swelling and erythema. Neurological: Negative for numbness or paresthesias. ____________________________________________   PHYSICAL EXAM:  VITAL SIGNS: ED Triage Vitals  Enc Vitals Group     BP 03/03/18 0705 (!) 142/88     Pulse Rate 03/03/18 0705 85     Resp 03/03/18 0705 18     Temp 03/03/18 0705 98.3 F (36.8 C)     Temp Source 03/03/18 0705 Oral     SpO2 03/03/18 0705 95 %     Weight 03/03/18 0706 240 lb (108.9 kg)     Height 03/03/18 0706  (1.676 m)     Head Circumference --      Peak Flow --      Pain Score 03/03/18 0706 5     Pain Loc --      Pain Edu? --      Excl. in GC? --      Constitutional: Well appearing. Eyes: Conjunctivae are clear without discharge or drainage. Nose: No rhinorrhea noted. Mouth/Throat: Airway is patent.  Neck: No stridor. Unrestricted range of motion observed.  Cardiovascular: Capillary refill is <3 seconds.  Respiratory: Respirations are even and unlabored.. Musculoskeletal: Unrestricted range of motion observed. Neurologic: Awake, alert, and oriented x 4.  Skin:  2+Pitting edema of the right lower extremity. Scattered erythema over the calf and pretibial area. No specific rash.  ____________________________________________   LABS (all  labs ordered are listed, but only abnormal results are displayed)  Labs Reviewed - No data to display ____________________________________________  EKG  Not indicated. ____________________________________________  RADIOLOGY  Chest x-ray negative for acute cardiopulmonary abnormality per radiology. Venous Doppler of the right lower extremity is negative for DVT per radiology. ____________________________________________   PROCEDURES  Procedures ____________________________________________   INITIAL IMPRESSION / ASSESSMENT AND PLAN / ED COURSE  Bradley Morrison is a  42 y.o. male who presents to the emergency department for treatment and evaluation of right lower extremity swelling and pain.  He will be treated with Bactrim for working diagnosis of cellulitis.  He was instructed to see his primary care provider in 2 3 days if not improving or sooner if worse.  He was advised he could return to the emergency department for concerns or if he is unable to schedule an appointment.  Medications - No data to display   Pertinent labs & imaging results that were available during my care of the patient were reviewed by me and considered in my medical decision making (see chart for details). ____________________________________________   FINAL CLINICAL IMPRESSION(S) / ED DIAGNOSES  Final diagnoses:  Cellulitis of right lower extremity    ED Discharge Orders        Ordered    sulfamethoxazole-trimethoprim (BACTRIM DS,SEPTRA DS) 800-160 MG tablet  2 times daily     03/03/18 0935    naproxen (NAPROSYN) 500 MG tablet  2 times daily with meals     03/03/18 0935       Note:  This document was prepared using Dragon voice recognition software and may include unintentional dictation errors.    Chinita Pester, FNP 03/03/18 1610    Jene Every, MD 03/03/18 1051

## 2018-03-03 NOTE — ED Triage Notes (Signed)
Pain in right leg for a few days now, denies injury to area. Red rash noted to calf and shin of right leg-pt states he just saw the rash this am, denies itching.

## 2018-03-03 NOTE — ED Notes (Addendum)
See triage note  States he developed some pain to right lower leg for couple of days  Woke up this am red ,rashy area to lower leg Also has some swelling to same leg  Denies any itching but complains of pain

## 2018-03-03 NOTE — ED Notes (Signed)
Calf measurements: Right 16.5" and left 14.5"

## 2018-03-03 NOTE — Discharge Instructions (Signed)
Follow up with your primary care provider or return to the ER for symptoms that are worsening or not improving over the next few days.

## 2018-03-06 ENCOUNTER — Emergency Department
Admission: EM | Admit: 2018-03-06 | Discharge: 2018-03-06 | Disposition: A | Discharge status: Home / Self Care | Payer: Medicare Other | Attending: Emergency Medicine | Admitting: Emergency Medicine

## 2018-03-06 ENCOUNTER — Encounter: Payer: Self-pay | Admitting: Emergency Medicine

## 2018-03-06 ENCOUNTER — Other Ambulatory Visit: Payer: Self-pay

## 2018-03-06 DIAGNOSIS — F17210 Nicotine dependence, cigarettes, uncomplicated: Secondary | ICD-10-CM | POA: Insufficient documentation

## 2018-03-06 DIAGNOSIS — I10 Essential (primary) hypertension: Secondary | ICD-10-CM | POA: Diagnosis not present

## 2018-03-06 DIAGNOSIS — G809 Cerebral palsy, unspecified: Secondary | ICD-10-CM | POA: Diagnosis not present

## 2018-03-06 DIAGNOSIS — R21 Rash and other nonspecific skin eruption: Secondary | ICD-10-CM | POA: Insufficient documentation

## 2018-03-06 MED ORDER — PREDNISONE 10 MG (21) PO TBPK: ORAL_TABLET | ORAL | 0 refills | Status: DC

## 2018-03-06 NOTE — ED Provider Notes (Signed)
[facility] Emergency Department Provider Note ____________________________________________  Time seen: 1828  I have reviewed the triage vital signs and the nursing notes.  HISTORY  Chief Complaint  Rash   HPI Bradley Morrison is a 42 y.o. male presents to the ER today for follow up of rash to RLE. He noticed the rash about 6 days ago. Was seen here for the same, diagnosed with a possible cellulitis, treated with Septra and Naproxen. He reports no improvement in rash, and is seeing new spots appear. The rash is tender. It does not itch. He has not seen any drainage from the area. He denies changes in soaps, lotions or detergents. He has not come in contact with anything that he is aware that he is allergic to. No one in his home has a similar rash. He has not tried anything OTC.  Past Medical History:  Diagnosis Date  . Anxiety   . CP (cerebral palsy) (HCC)   . Hypertension     Patient Active Problem List   Diagnosis Date Noted  . Anxiety     History reviewed. No pertinent surgical history.  Prior to Admission medications   Medication Sig Start Date End Date Taking? Authorizing Provider  brompheniramine-pseudoephedrine-DM 30-2-10 MG/5ML syrup Take 5 mLs by mouth 4 (four) times daily as needed. 02/22/18   Tommi Rumps, PA-C  predniSONE (STERAPRED UNI-PAK 21 TAB) 10 MG (21) TBPK tablet As directed 03/06/18   Lorre Munroe, NP  sulfamethoxazole-trimethoprim (BACTRIM DS,SEPTRA DS) 800-160 MG tablet Take 1 tablet by mouth 2 (two) times daily. 03/03/18   Chinita Pester, FNP    Allergies Patient has no known allergies.  No family history on file.  Social History Social History   Tobacco Use  . Smoking status: Current Every Day Smoker    Packs/day: 0.50    Types: Cigarettes  . Smokeless tobacco: Never Used  Substance Use Topics  . Alcohol use: No  . Drug use: No    Review of Systems  Constitutional: Negative for fever. Musculoskeletal:  Negative for difficulty with gait. Skin: Positive for rash.  ____________________________________________  PHYSICAL EXAM:  VITAL SIGNS: ED Triage Vitals  Enc Vitals Group     BP 03/06/18 1811 139/78     Pulse Rate 03/06/18 1811 89     Resp 03/06/18 1811 16     Temp 03/06/18 1811 99 F (37.2 C)     Temp Source 03/06/18 1811 Oral     SpO2 03/06/18 1811 96 %     Weight 03/06/18 1812 240 lb (108.9 kg)     Height 03/06/18 1812  (1.676 m)     Head Circumference --      Peak Flow --      Pain Score 03/06/18 1812 5     Pain Loc --      Pain Edu? --      Excl. in GC? --     Constitutional: Alert and oriented. Well appearing and in no distress. Cardiovascular: Normal rate, regular rhythm. Respiratory: Normal respiratory effort. No wheezes/rales/rhonchi. Musculoskeletal: No difficulty with gait Neurologic: No gross focal neurologic deficits are appreciated. Skin:  Skin is warm, dry and intact.  Large annular lesion noted to right lateral calf.  Satellite lesions noted on the posterior calf.  Lesions are blanchable, tender to palpation.  No erythema or drainage noted.  INITIAL IMPRESSION / ASSESSMENT AND PLAN / ED COURSE  Rash of right leg:  No improvement with Septra, but we  will have him continue for now  Stop naproxen Rx for Pred Taper x 6 days Follow-up with PCP or dermatology next week if no improvement ____________________________________________  FINAL CLINICAL IMPRESSION(S) / ED DIAGNOSES  Final diagnoses:  Rash      Lorre Munroe, NP 03/06/18 6962    Phineas Semen, MD 03/06/18 2052

## 2018-03-06 NOTE — ED Triage Notes (Signed)
Pt in via POV with rash to right calf, reports some pain, denies any itching.  Was seen here for same Thursday, prescribed unknown medication, advised to come back if it doesn't get any better.  Vitals WDL, NAD noted at this time.

## 2018-03-06 NOTE — Discharge Instructions (Addendum)
You were diagnosed with a rash Continue Septra We have given you a RX  for Prednisone Follow up with your PCP in 2 days if no improvement

## 2018-08-25 DIAGNOSIS — F3341 Major depressive disorder, recurrent, in partial remission: Secondary | ICD-10-CM | POA: Insufficient documentation

## 2018-11-12 IMAGING — US US EXTREM LOW VENOUS*R*
1 series · 13 of 24 positions shown · non-contrast
Comparison: None.

CLINICAL DATA: 41-year-old male with a history of right leg pain



[Series 1: us extrem low venous*right* · 0.08mm/px · 13 of 31 slices shown]
[im 1/31]
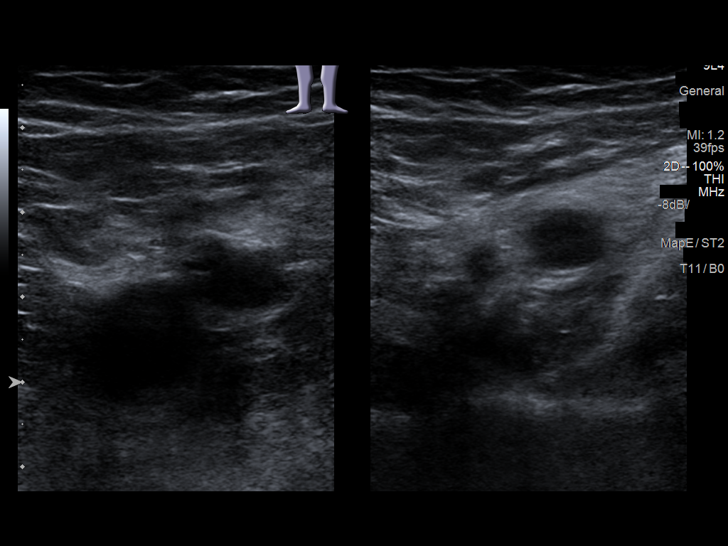
[im 3/31]
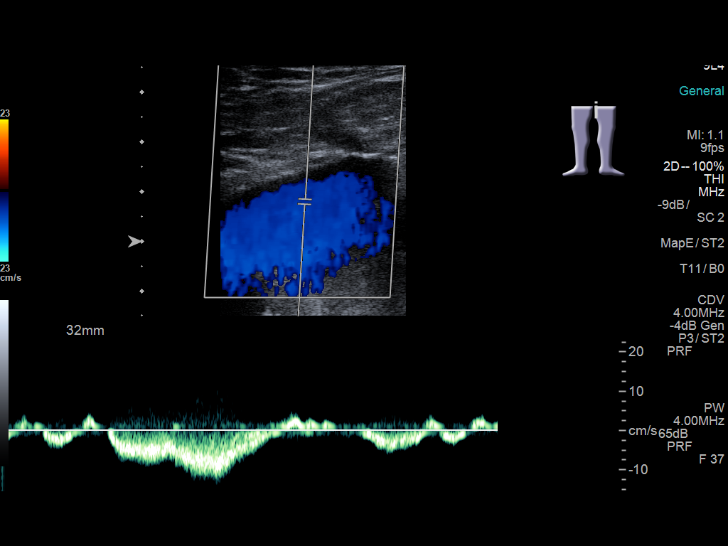
[im 6/31]
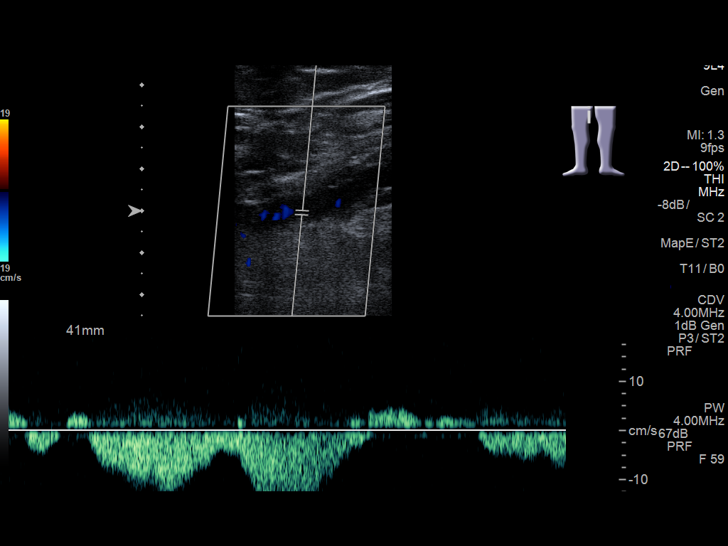
[im 8/31]
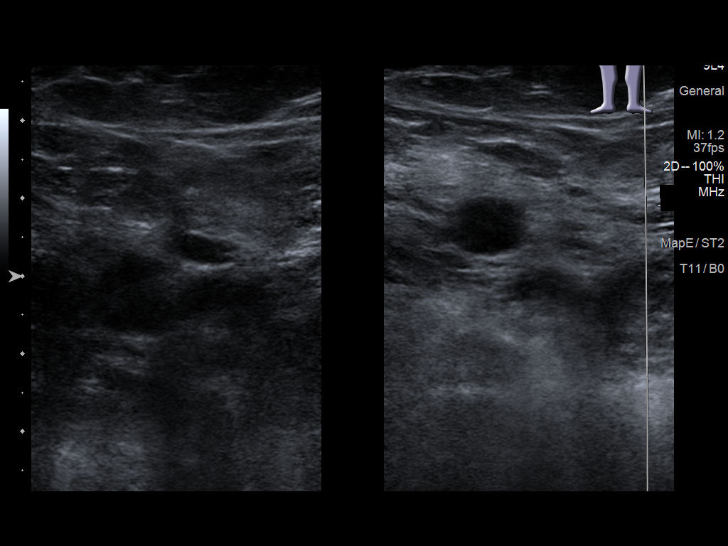
[im 11/31]
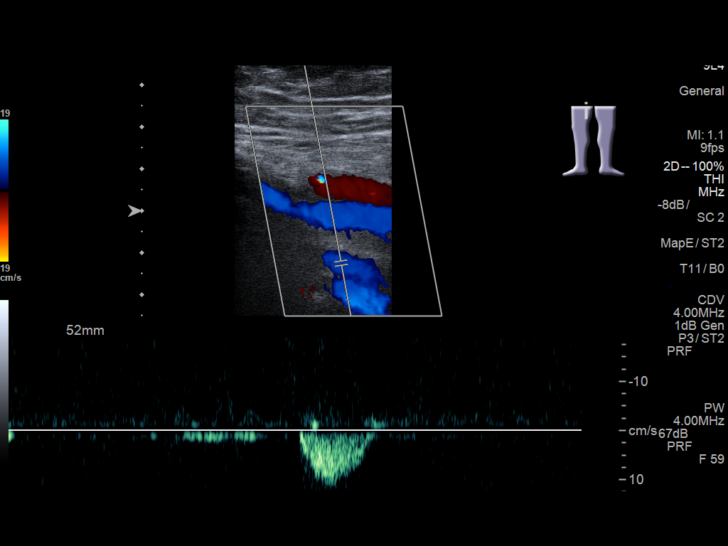
[im 14/31]
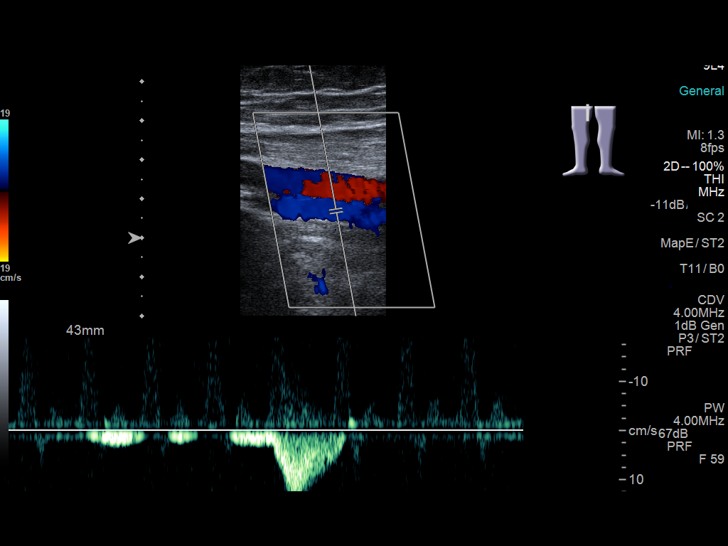
[im 16/31]
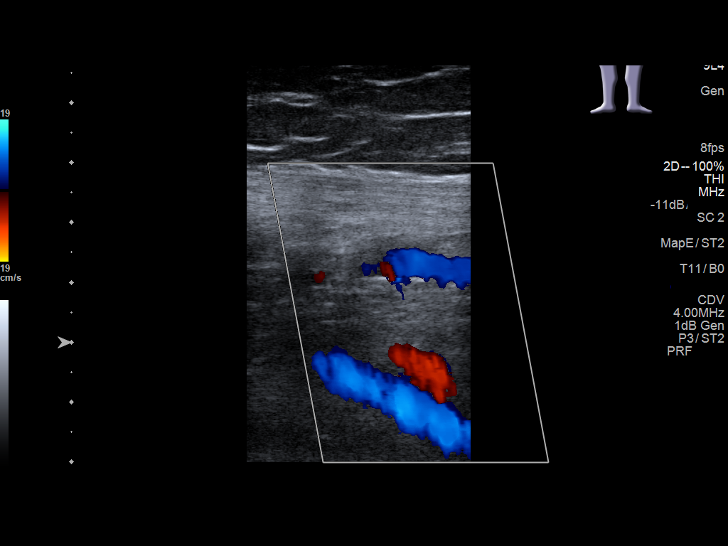
[im 17/31]
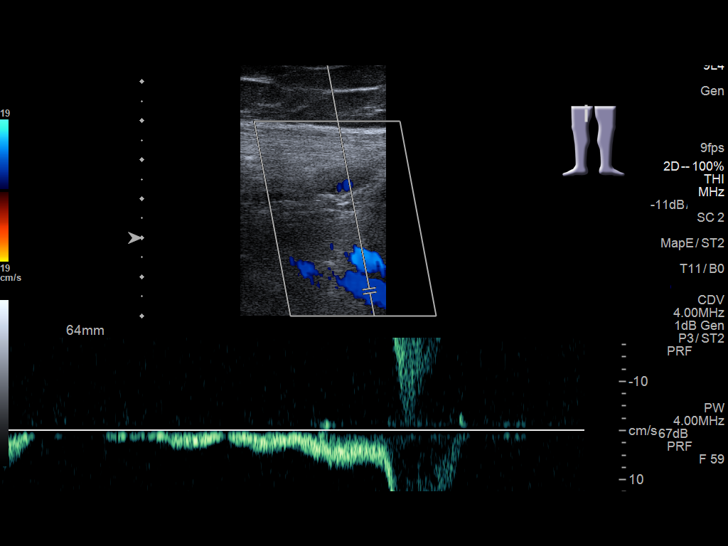
[im 20/31]
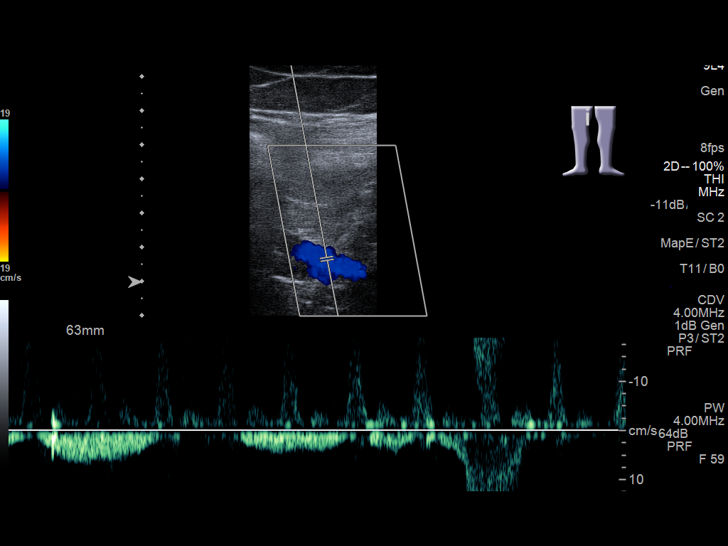
[im 23/31]
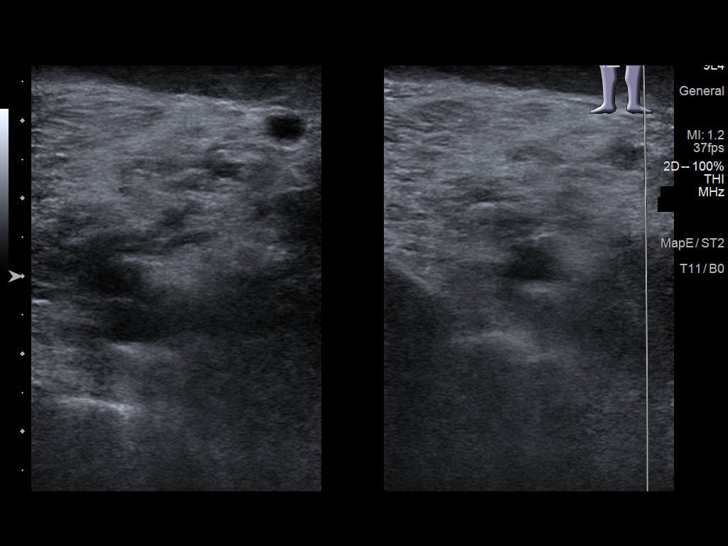
[im 25/31]
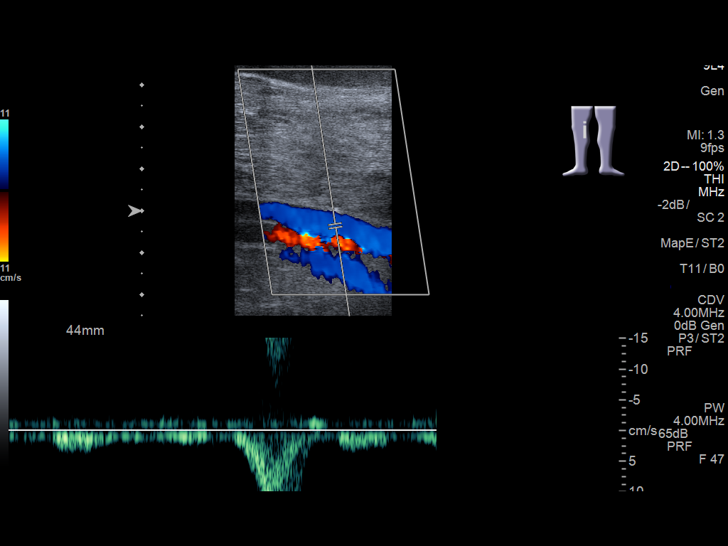
[im 28/31]
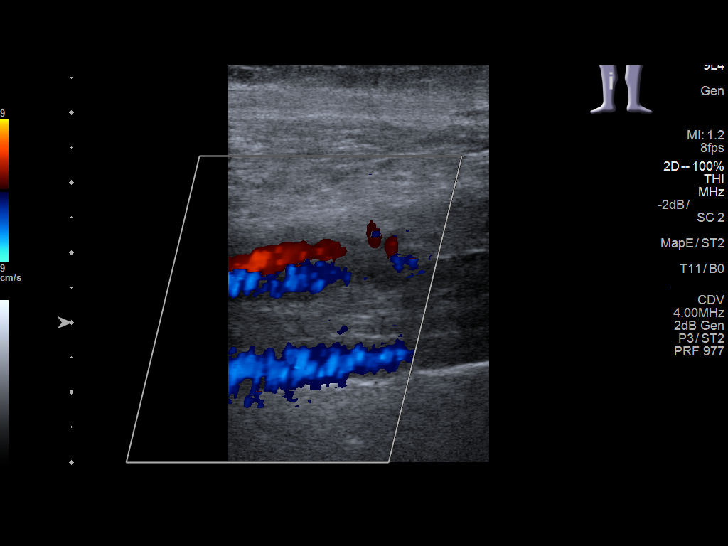
[im 31/31]
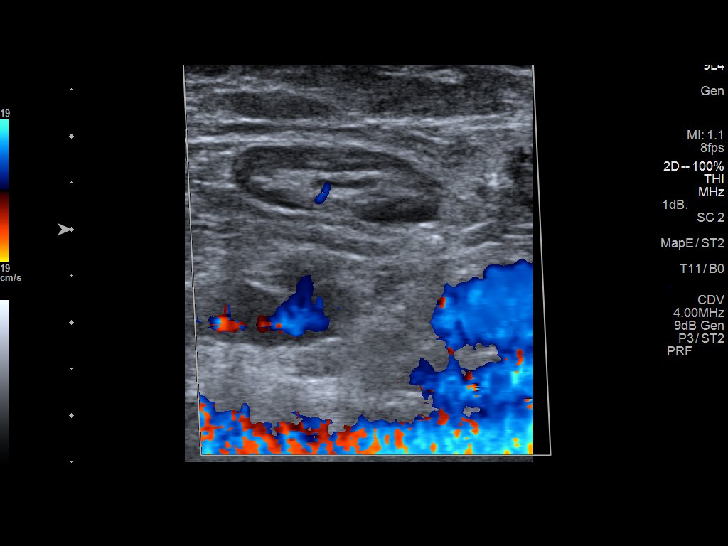

[13 of 24 positions shown; findings below may reference images not displayed]

FINDINGS: Contralateral Common Femoral Vein: Respiratory phasicity is normal
and symmetric with the symptomatic side. No evidence of thrombus.
Normal compressibility.

Common Femoral Vein: No evidence of thrombus. Normal
compressibility, respiratory phasicity and response to augmentation.

Saphenofemoral Junction: No evidence of thrombus. Normal
compressibility and flow on color Doppler imaging.

Profunda Femoral Vein: No evidence of thrombus. Normal
compressibility and flow on color Doppler imaging.

Femoral Vein: No evidence of thrombus. Normal compressibility,
respiratory phasicity and response to augmentation.

Popliteal Vein: No evidence of thrombus. Normal compressibility,
respiratory phasicity and response to augmentation.

Calf Veins: No evidence of thrombus. Normal compressibility and flow
on color Doppler imaging.

Superficial Great Saphenous Vein: No evidence of thrombus. Normal
compressibility and flow on color Doppler imaging.

Other Findings:  Right inguinal lymph nodes
IMPRESSION: Sonographic survey of the right lower extremity negative for DVT

## 2019-10-25 DIAGNOSIS — R32 Unspecified urinary incontinence: Secondary | ICD-10-CM | POA: Diagnosis not present

## 2019-10-26 DIAGNOSIS — R262 Difficulty in walking, not elsewhere classified: Secondary | ICD-10-CM | POA: Diagnosis not present

## 2019-10-26 DIAGNOSIS — Q667 Congenital pes cavus, unspecified foot: Secondary | ICD-10-CM | POA: Diagnosis not present

## 2019-10-26 DIAGNOSIS — E669 Obesity, unspecified: Secondary | ICD-10-CM | POA: Diagnosis not present

## 2019-10-31 DIAGNOSIS — Q667 Congenital pes cavus, unspecified foot: Secondary | ICD-10-CM | POA: Diagnosis not present

## 2019-10-31 DIAGNOSIS — R262 Difficulty in walking, not elsewhere classified: Secondary | ICD-10-CM | POA: Diagnosis not present

## 2019-10-31 DIAGNOSIS — E669 Obesity, unspecified: Secondary | ICD-10-CM | POA: Diagnosis not present

## 2019-11-02 DIAGNOSIS — E669 Obesity, unspecified: Secondary | ICD-10-CM | POA: Diagnosis not present

## 2019-11-02 DIAGNOSIS — Q667 Congenital pes cavus, unspecified foot: Secondary | ICD-10-CM | POA: Diagnosis not present

## 2019-11-02 DIAGNOSIS — R262 Difficulty in walking, not elsewhere classified: Secondary | ICD-10-CM | POA: Diagnosis not present

## 2019-11-11 DIAGNOSIS — G809 Cerebral palsy, unspecified: Secondary | ICD-10-CM | POA: Diagnosis not present

## 2019-11-30 DIAGNOSIS — Q6671 Congenital pes cavus, right foot: Secondary | ICD-10-CM | POA: Diagnosis not present

## 2019-11-30 DIAGNOSIS — I10 Essential (primary) hypertension: Secondary | ICD-10-CM | POA: Diagnosis not present

## 2019-11-30 DIAGNOSIS — Q667 Congenital pes cavus, unspecified foot: Secondary | ICD-10-CM | POA: Diagnosis not present

## 2019-11-30 DIAGNOSIS — E119 Type 2 diabetes mellitus without complications: Secondary | ICD-10-CM | POA: Diagnosis not present

## 2019-11-30 DIAGNOSIS — R262 Difficulty in walking, not elsewhere classified: Secondary | ICD-10-CM | POA: Diagnosis not present

## 2019-11-30 DIAGNOSIS — E669 Obesity, unspecified: Secondary | ICD-10-CM | POA: Diagnosis not present

## 2019-12-12 DIAGNOSIS — G809 Cerebral palsy, unspecified: Secondary | ICD-10-CM | POA: Diagnosis not present

## 2019-12-25 DIAGNOSIS — F410 Panic disorder [episodic paroxysmal anxiety] without agoraphobia: Secondary | ICD-10-CM | POA: Diagnosis not present

## 2019-12-25 DIAGNOSIS — Z550 Illiteracy and low-level literacy: Secondary | ICD-10-CM | POA: Diagnosis not present

## 2019-12-25 DIAGNOSIS — E1165 Type 2 diabetes mellitus with hyperglycemia: Secondary | ICD-10-CM | POA: Diagnosis not present

## 2019-12-25 DIAGNOSIS — F329 Major depressive disorder, single episode, unspecified: Secondary | ICD-10-CM | POA: Diagnosis not present

## 2019-12-25 DIAGNOSIS — R739 Hyperglycemia, unspecified: Secondary | ICD-10-CM | POA: Diagnosis not present

## 2019-12-25 DIAGNOSIS — G809 Cerebral palsy, unspecified: Secondary | ICD-10-CM | POA: Diagnosis not present

## 2019-12-25 DIAGNOSIS — Z79899 Other long term (current) drug therapy: Secondary | ICD-10-CM | POA: Diagnosis not present

## 2019-12-25 DIAGNOSIS — R519 Headache, unspecified: Secondary | ICD-10-CM | POA: Diagnosis not present

## 2019-12-25 DIAGNOSIS — Z7984 Long term (current) use of oral hypoglycemic drugs: Secondary | ICD-10-CM | POA: Diagnosis not present

## 2020-01-02 DIAGNOSIS — E119 Type 2 diabetes mellitus without complications: Secondary | ICD-10-CM | POA: Diagnosis not present

## 2020-01-02 DIAGNOSIS — F419 Anxiety disorder, unspecified: Secondary | ICD-10-CM | POA: Diagnosis not present

## 2020-01-02 DIAGNOSIS — I10 Essential (primary) hypertension: Secondary | ICD-10-CM | POA: Diagnosis not present

## 2020-01-09 DIAGNOSIS — G809 Cerebral palsy, unspecified: Secondary | ICD-10-CM | POA: Diagnosis not present

## 2020-01-09 DIAGNOSIS — M25571 Pain in right ankle and joints of right foot: Secondary | ICD-10-CM | POA: Diagnosis not present

## 2020-02-25 IMAGING — CR DG CHEST 2V
2 series · 2 of 2 positions shown · non-contrast
Comparison: PA and lateral chest 01/02/2014.

CLINICAL DATA: Cough, congestion and sore throat for several days.

EXAM:
CHEST - 2 VIEW

[chest pa]
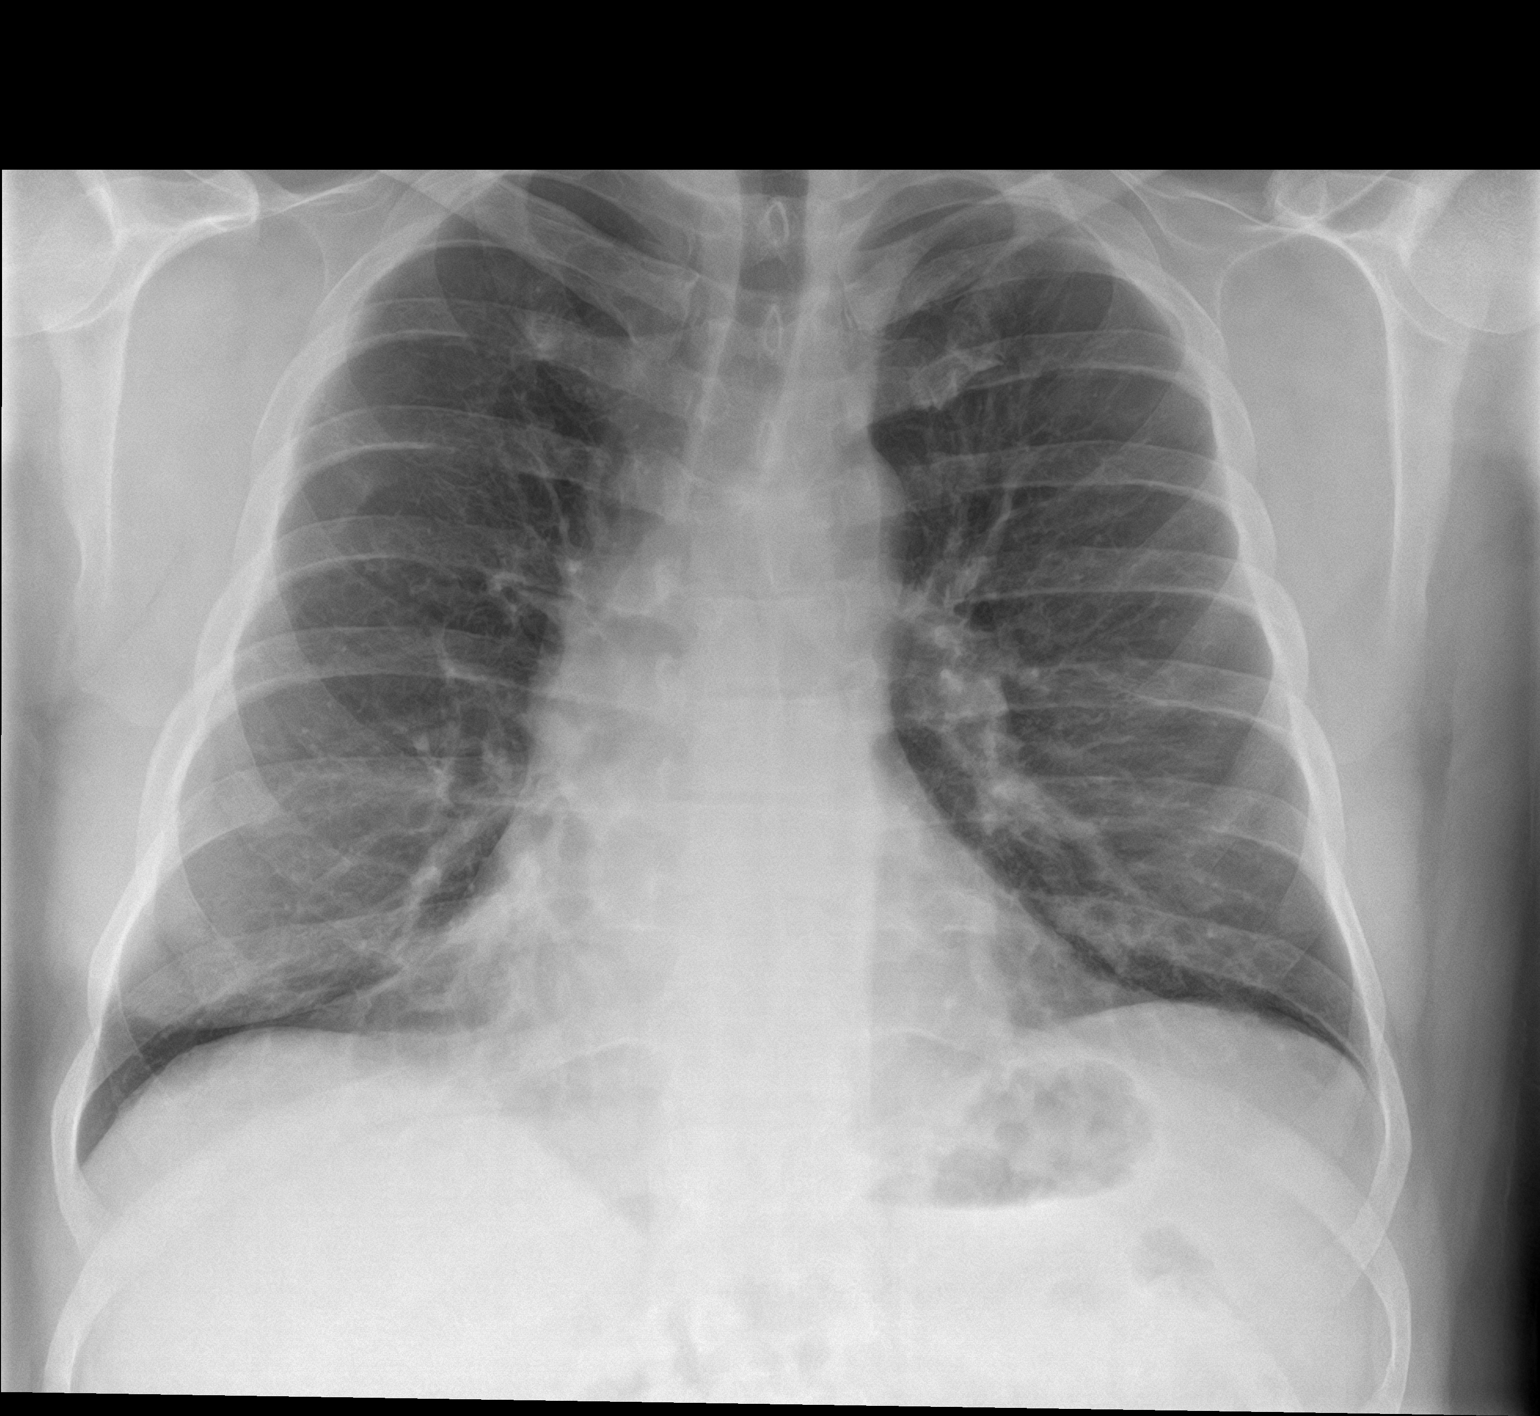

[chest lat]
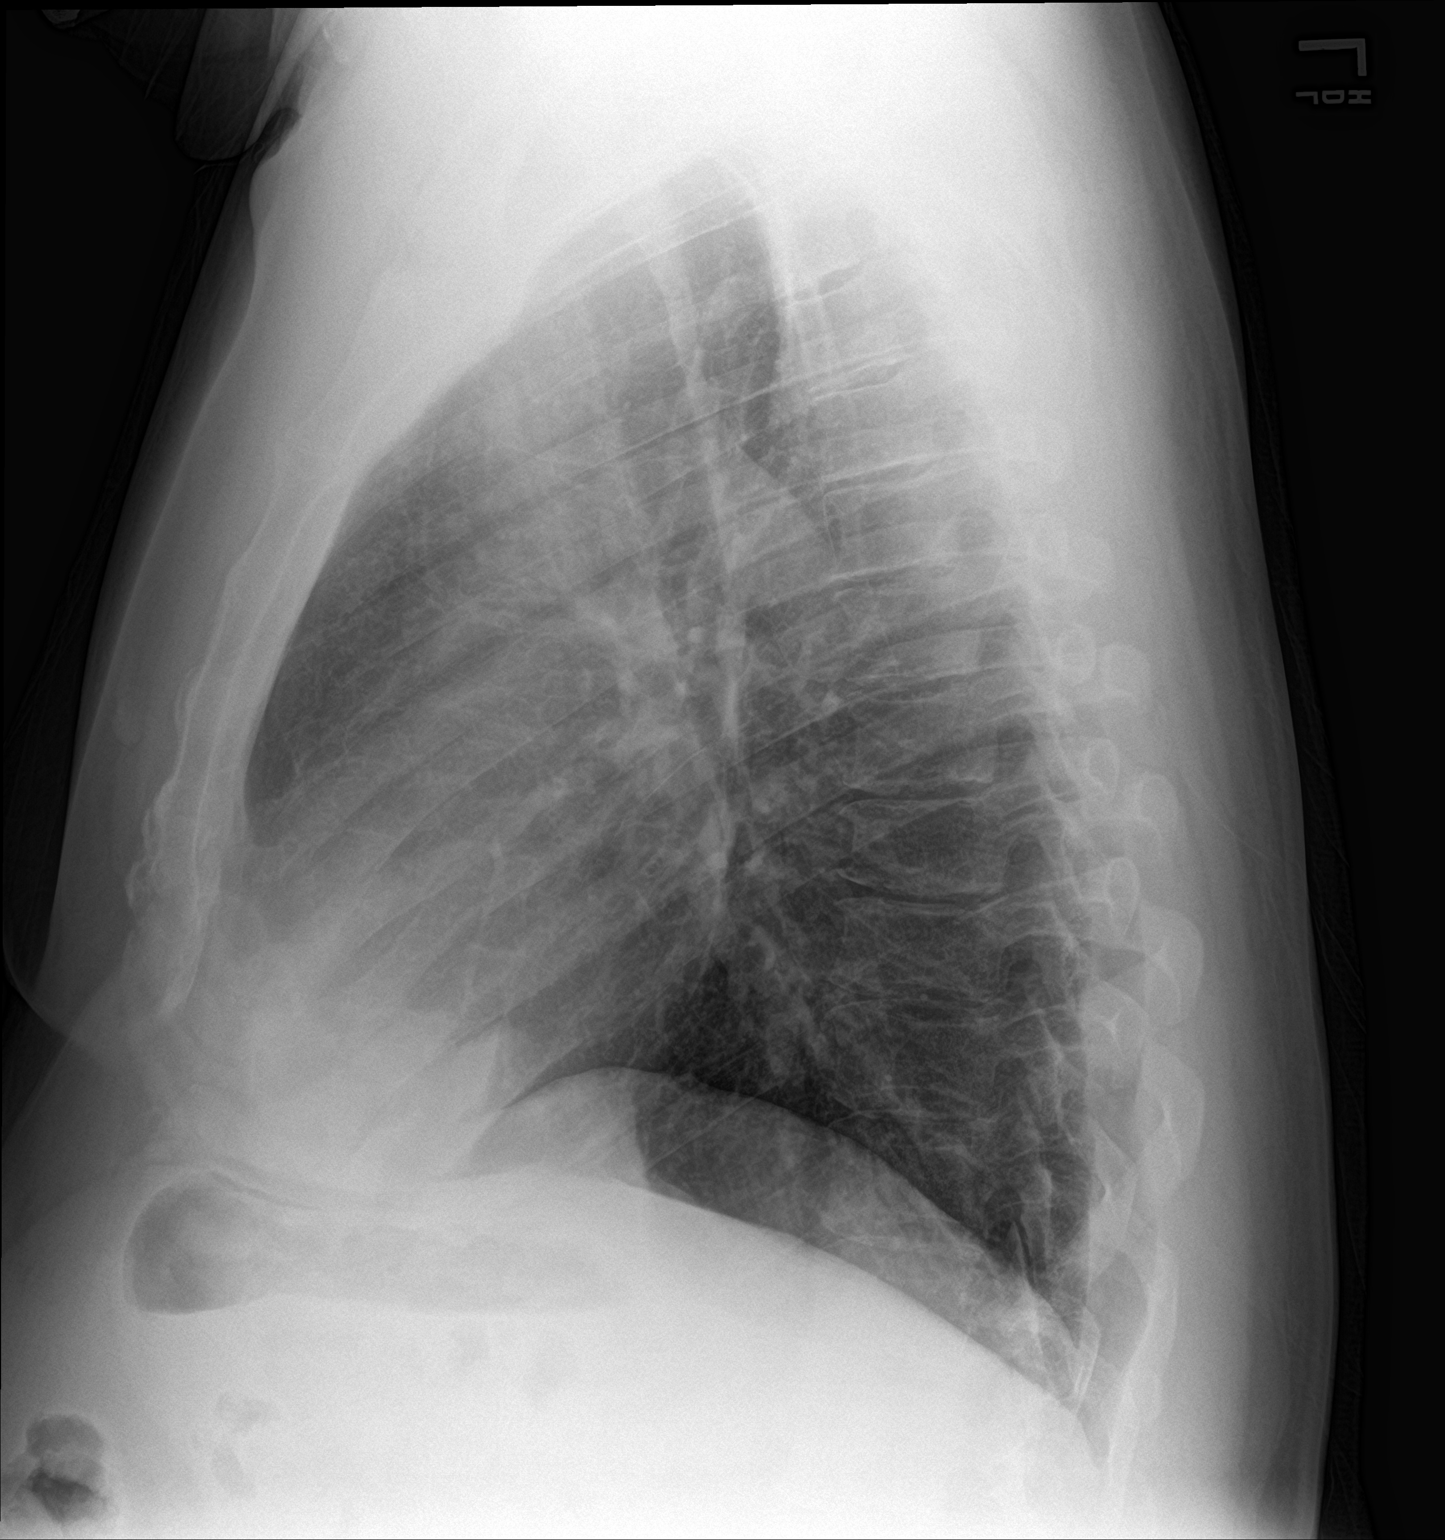

[2 of 2 positions shown; findings below may reference images not displayed]

FINDINGS: The lungs are clear. Heart size is normal. There is no pneumothorax
or pleural effusion. No acute bony abnormality.
IMPRESSION: No acute disease.

## 2020-03-05 DIAGNOSIS — G4733 Obstructive sleep apnea (adult) (pediatric): Secondary | ICD-10-CM | POA: Insufficient documentation

## 2021-12-22 NOTE — Progress Notes (Signed)
[facility] ?[address] ?[place], [place] [address] ? ?Pulmonary Sleep Medicine  ? ?Office Visit Note ? ?Patient Name: Bradley Morrison ?DOB: [birth year] ?MRN [id number] ? ? ? ?Chief Complaint: Obstructive Sleep Apnea visit ? ?Brief History: ? ?Sadrac is seen today for an initial consult for CPAP@ 11 cmH2O and to establish care.  The patient has a 8 year history of sleep apnea. Patient is using PAP nightly.  The patient feels rested after sleeping with PAP.  The patient reports benefit from PAP use. Reported sleepiness is improved and the Epworth Sleepiness Score is 10 out of 24. The patient does take naps daily. The patient complains of the following: needs new supplies.  The compliance download shows 77% compliance with an average use time of 6 hours 9 minutes. The AHI is 0.4.  The patient does not complain of limb movements disrupting sleep.  ? ?ROS ? ?General: (-) fever, (-) chills, (-) night sweat ?Nose and Sinuses: (-) nasal stuffiness or itchiness, (-) postnasal drip, (-) nosebleeds, (-) sinus trouble. ?Mouth and Throat: (-) sore throat, (-) hoarseness. ?Neck: (-) swollen glands, (-) enlarged thyroid, (-) neck pain. ?Respiratory: - cough, - shortness of breath, - wheezing. ?Neurologic: - numbness, - tingling. ?Psychiatric: - anxiety, - depression ? ? ?Current Medication: ?Outpatient Encounter Medications as of 12/23/2021  ?Medication Sig  ? amLODipine (NORVASC) 5 MG tablet Take 1 tablet by mouth daily.  ? atorvastatin (LIPITOR) 40 MG tablet Take 40 mg by mouth daily.  ? citalopram (CELEXA) 40 MG tablet Take 40 mg by mouth daily.  ? FARXIGA 10 MG TABS tablet Take 10 mg by mouth daily.  ? gabapentin (NEURONTIN) 300 MG capsule Take 300 mg by mouth 3 (three) times daily.  ? losartan-hydrochlorothiazide (HYZAAR) 50-12.5 MG tablet Take by mouth.  ? metFORMIN (GLUCOPHAGE) 500 MG tablet Take 1,000 mg by mouth 2 (two) times daily.  ? predniSONE (STERAPRED UNI-PAK 21 TAB) 10 MG (21) TBPK tablet As directed   ? [DISCONTINUED] brompheniramine-pseudoephedrine-DM 30-2-10 MG/5ML syrup Take 5 mLs by mouth 4 (four) times daily as needed.  ? [DISCONTINUED] sulfamethoxazole-trimethoprim (BACTRIM DS,SEPTRA DS) 800-160 MG tablet Take 1 tablet by mouth 2 (two) times daily.  ? ?No facility-administered encounter medications on file as of 12/23/2021.  ? ? ?Surgical History: ?History reviewed. No pertinent surgical history. ? ?Medical History: ?Past Medical History:  ?Diagnosis Date  ? Anxiety   ? CP (cerebral palsy) (HCC)   ? Hypertension   ? ? ?Family History: ?Non contributory to the present illness ? ?Social History: ?Social History  ? ?Socioeconomic History  ? Marital status: Legally Separated  ?  Spouse name: Not on file  ? Number of children: Not on file  ? Years of education: Not on file  ? Highest education level: Not on file  ?Occupational History  ? Not on file  ?Tobacco Use  ? Smoking status: Every Day  ?  Packs/day: 1.00  ?  Types: Cigarettes  ? Smokeless tobacco: Never  ?Vaping Use  ? Vaping Use: Never used  ?Substance and Sexual Activity  ? Alcohol use: No  ? Drug use: No  ? Sexual activity: Not on file  ?Other Topics Concern  ? Not on file  ?Social History Narrative  ? Not on file  ? ?Social Determinants of Health  ? ?Financial Resource Strain: Not on file  ?Food Insecurity: Not on file  ?Transportation Needs: Not on file  ?Physical Activity: Not on file  ?Stress: Not on file  ?Social  Connections: Not on file  ?Intimate Partner Violence: Not on file  ? ? ?Vital Signs: ?Pulse 81, resp. rate 18, height 5\' 6"  (1.676 m), weight 236 lb (107 kg), SpO2 96 %. ?Body mass index is 38.09 kg/m?.  ? ? ?Examination: ?General Appearance: The patient is well-developed, well-nourished, and in no distress. ?Neck Circumference: 43 cm ?Skin: Gross inspection of skin unremarkable. ?Head: normocephalic, no gross deformities. ?Eyes: no gross deformities noted. ?ENT: ears appear grossly normal ?Neurologic: Alert and oriented. No involuntary  movements. ? ? ? ?EPWORTH SLEEPINESS SCALE: ? ?Scale:  ?(0)= no chance of dozing; (1)= slight chance of dozing; (2)= moderate chance of dozing; (3)= high chance of dozing ? ?Chance  Situtation ?   ?Sitting and reading: 2 ?  ? Watching TV: 2 ?   ?Sitting Inactive in public: 0 ?   ?As a passenger in car: 0   ?   ?Lying down to rest: 3 ?   ?Sitting and talking: 0 ?   ?Sitting quielty after lunch: 3 ?   ?In a car, stopped in traffic: 0 ? ? ?TOTAL SCORE:   10 out of 24 ? ? ? ?SLEEP STUDIES: ? ?PSG (03/2014) AHI 7/hr, REM/Supine AHI 66/hr, min SpO2 86%.  ?Titration (04/2014) CPAP@ 11 cmH2O ? ? ?CPAP COMPLIANCE DATA: ? ?Date Range: 06/26/2021-12/22/2021 ? ?Average Daily Use: 6 hours 9 minutes ? ?Median Use: 6 hours 8 minutes ? ?Compliance for > 4 Hours: 77% ? ?AHI: 0.4 respiratory events per hour ? ?Days Used: 167/180 days ? ?Mask Leak: 46.3 ? ?95th Percentile Pressure: 11 ? ? ? ? ? ? ? ? ?LABS: ?No results found for this or any previous visit (from the past 2160 hour(s)). ? ?Radiology: ?No results found. ? ?No results found. ? ?No results found. ? ? ? ?Assessment and Plan: ?Patient Active Problem List  ? Diagnosis Date Noted  ? Obstructive sleep apnea 03/05/2020  ? Recurrent major depressive disorder, in partial remission (HCC) 08/25/2018  ? Anxiety   ? Obesity (BMI 30-39.9) 10/31/2014  ? Hypertension associated with diabetes (HCC) 05/21/2014  ? ? ? ? ?The patient does tolerate PAP and reports benefit from PAP use. The patient was reminded how to adjust mask fit and advised to change supplies regularly. The patient was also counselled on nightly use. The compliance is fair. The AHI is 1.4.  ? ? ?1. Obstructive sleep apnea ?Continue nightly use ? ?2. CPAP use counseling ?CPAP couseling-Discussed importance of adequate CPAP use as well as proper care and cleaning techniques of machine and all supplies. ? ?3. Hypertension associated with diabetes (HCC) ?Elevate din office, pt has not taken meds yet and will do so now.  Advised to monitor closely and f/u with PCP. Also advised to go to ED if any symptoms arise or BP not improving. ? ?4. Anxiety ?Continue current medication and f/u with PCP. ? ?5. Recurrent major depressive disorder, in partial remission (HCC) ?Continue current medication and f/u with PCP. ? ?6. Obesity (BMI 30-39.9) ?Obesity Counseling: Had a lengthy discussion regarding patients BMI and weight issues. Patient was instructed on portion control as well as increased activity. Also discussed caloric restrictions with trying to maintain intake less than 2000 Kcal. Discussions were made in accordance with the 5As of weight management. Simple actions such as not eating late and if able to, taking a walk is suggested. ? ? ? ?General Counseling: I have discussed the findings of the evaluation and examination with Zakhai.  I have also discussed any  further diagnostic evaluation thatmay be needed or ordered today. Dontay verbalizes understanding of the findings of todays visit. We also reviewed his medications today and discussed drug interactions and side effects including but not limited excessive drowsiness and altered mental states. We also discussed that there is always a risk not just to him but also people around him. he has been encouraged to call the office with any questions or concerns that should arise related to todays visit. ? ?No orders of the defined types were placed in this encounter. ?  ? ? ? ? ?I have personally obtained a history, examined the patient, evaluated laboratory and imaging results, formulated the assessment and plan and placed orders. ? ?This patient was seen by Lynn Ito, PA-C in collaboration with Dr. Freda Munro as a part of collaborative care agreement. ? ?Yevonne Pax, MD FCCP ?Diplomate ABMS Pulmonary Critical Care Medicine and Sleep Medicine ?

## 2021-12-23 ENCOUNTER — Ambulatory Visit (INDEPENDENT_AMBULATORY_CARE_PROVIDER_SITE_OTHER): Payer: 59 | Admitting: Internal Medicine

## 2021-12-23 VITALS — HR 81 | Resp 18 | Ht 66.0 in | Wt 236.0 lb

## 2021-12-23 DIAGNOSIS — Z7189 Other specified counseling: Secondary | ICD-10-CM

## 2021-12-23 DIAGNOSIS — E1159 Type 2 diabetes mellitus with other circulatory complications: Secondary | ICD-10-CM | POA: Diagnosis not present

## 2021-12-23 DIAGNOSIS — G4733 Obstructive sleep apnea (adult) (pediatric): Secondary | ICD-10-CM | POA: Diagnosis not present

## 2021-12-23 DIAGNOSIS — F419 Anxiety disorder, unspecified: Secondary | ICD-10-CM

## 2021-12-23 DIAGNOSIS — F3341 Major depressive disorder, recurrent, in partial remission: Secondary | ICD-10-CM

## 2021-12-23 DIAGNOSIS — I152 Hypertension secondary to endocrine disorders: Secondary | ICD-10-CM

## 2021-12-23 DIAGNOSIS — E669 Obesity, unspecified: Secondary | ICD-10-CM

## 2021-12-23 NOTE — Patient Instructions (Signed)

## 2022-10-27 ENCOUNTER — Emergency Department
Admission: EM | Admit: 2022-10-27 | Discharge: 2022-10-27 | Disposition: A | Discharge status: Home / Self Care | Payer: Medicare HMO | Attending: Emergency Medicine | Admitting: Emergency Medicine

## 2022-10-27 ENCOUNTER — Emergency Department: Payer: Medicare HMO

## 2022-10-27 ENCOUNTER — Other Ambulatory Visit: Payer: Self-pay

## 2022-10-27 DIAGNOSIS — R000 Tachycardia, unspecified: Secondary | ICD-10-CM | POA: Diagnosis not present

## 2022-10-27 DIAGNOSIS — E860 Dehydration: Secondary | ICD-10-CM | POA: Insufficient documentation

## 2022-10-27 DIAGNOSIS — E119 Type 2 diabetes mellitus without complications: Secondary | ICD-10-CM | POA: Insufficient documentation

## 2022-10-27 DIAGNOSIS — K529 Noninfective gastroenteritis and colitis, unspecified: Secondary | ICD-10-CM | POA: Diagnosis not present

## 2022-10-27 DIAGNOSIS — R109 Unspecified abdominal pain: Secondary | ICD-10-CM | POA: Diagnosis present

## 2022-10-27 DIAGNOSIS — I10 Essential (primary) hypertension: Secondary | ICD-10-CM | POA: Diagnosis not present

## 2022-10-27 LAB — URINALYSIS, ROUTINE W REFLEX MICROSCOPIC
Bacteria, UA: NONE SEEN
Bilirubin Urine: NEGATIVE
Glucose, UA: >=500 mg/dL — AB
Hgb urine dipstick: NEGATIVE
Ketones, ur: NEGATIVE mg/dL
Leukocytes,Ua: NEGATIVE
Nitrite: NEGATIVE
Protein, ur: 100 mg/dL — AB
Specific Gravity, Urine: 1.033 — ABNORMAL HIGH (ref 1.005–1.030)
pH: 5 (ref 5.0–8.0)

## 2022-10-27 LAB — COMPREHENSIVE METABOLIC PANEL
ALT: 51 U/L — ABNORMAL HIGH (ref 0–44)
AST: 33 U/L (ref 15–41)
Albumin: 4 g/dL (ref 3.5–5.0)
Alkaline Phosphatase: 93 U/L (ref 38–126)
Anion gap: 12 (ref 5–15)
BUN: 21 mg/dL — ABNORMAL HIGH (ref 6–20)
CO2: 24 mmol/L (ref 22–32)
Calcium: 8.7 mg/dL — ABNORMAL LOW (ref 8.9–10.3)
Chloride: 99 mmol/L (ref 98–111)
Creatinine, Ser: 0.86 mg/dL (ref 0.61–1.24)
GFR, Estimated: >60 mL/min (ref >60)
Glucose, Bld: 352 mg/dL — ABNORMAL HIGH (ref 70–99)
Potassium: 3.7 mmol/L (ref 3.5–5.1)
Sodium: 135 mmol/L (ref 135–145)
Total Bilirubin: 0.7 mg/dL (ref 0.3–1.2)
Total Protein: 8.1 g/dL (ref 6.5–8.1)

## 2022-10-27 LAB — CBC
HCT: 55.2 % — ABNORMAL HIGH (ref 39.0–52.0)
Hemoglobin: 17.5 g/dL — ABNORMAL HIGH (ref 13.0–17.0)
MCH: 27.6 pg (ref 26.0–34.0)
MCHC: 31.7 g/dL (ref 30.0–36.0)
MCV: 86.9 fL (ref 80.0–100.0)
Platelets: 270 10*3/uL (ref 150–400)
RBC: 6.35 MIL/uL — ABNORMAL HIGH (ref 4.22–5.81)
RDW: 13.8 % (ref 11.5–15.5)
WBC: 10.6 10*3/uL — ABNORMAL HIGH (ref 4.0–10.5)
nRBC: 0 % (ref 0.0–0.2)

## 2022-10-27 LAB — LIPASE, BLOOD: Lipase: 28 U/L (ref 11–51)

## 2022-10-27 MED ORDER — SODIUM CHLORIDE 0.9 % IV BOLUS
1000.0000 mL | Freq: Once | INTRAVENOUS | Status: AC
  Administered 2022-10-27: 1000 mL via INTRAVENOUS

## 2022-10-27 MED ORDER — ONDANSETRON 4 MG PO TBDP: 4.0000 mg | ORAL_TABLET | Freq: Three times a day (TID) | ORAL | 0 refills | Status: DC | PRN

## 2022-10-27 MED ORDER — MORPHINE SULFATE (PF) 4 MG/ML IV SOLN
4.0000 mg | Freq: Once | INTRAVENOUS | Status: DC
  Filled 2022-10-27: qty 1

## 2022-10-27 MED ORDER — KETOROLAC TROMETHAMINE 30 MG/ML IJ SOLN
15.0000 mg | Freq: Once | INTRAMUSCULAR | Status: AC
  Administered 2022-10-27: 15 mg via INTRAVENOUS
  Filled 2022-10-27: qty 1

## 2022-10-27 MED ORDER — DIPHENHYDRAMINE HCL 50 MG/ML IJ SOLN
25.0000 mg | Freq: Once | INTRAMUSCULAR | Status: DC
  Filled 2022-10-27: qty 1

## 2022-10-27 MED ORDER — ONDANSETRON HCL 4 MG/2ML IJ SOLN
4.0000 mg | Freq: Once | INTRAMUSCULAR | Status: AC
  Administered 2022-10-27: 4 mg via INTRAVENOUS
  Filled 2022-10-27: qty 2

## 2022-10-27 MED ORDER — ALUM & MAG HYDROXIDE-SIMETH 200-200-20 MG/5ML PO SUSP
30.0000 mL | Freq: Once | ORAL | Status: AC
  Administered 2022-10-27: 30 mL via ORAL
  Filled 2022-10-27: qty 30

## 2022-10-27 MED ORDER — DICYCLOMINE HCL 20 MG PO TABS: 20.0000 mg | ORAL_TABLET | Freq: Three times a day (TID) | ORAL | 0 refills | Status: DC | PRN

## 2022-10-27 MED ORDER — DICYCLOMINE HCL 10 MG PO CAPS
20.0000 mg | ORAL_CAPSULE | Freq: Once | ORAL | Status: AC
  Administered 2022-10-27: 20 mg via ORAL
  Filled 2022-10-27: qty 2

## 2022-10-27 MED ORDER — METOCLOPRAMIDE HCL 5 MG/ML IJ SOLN
10.0000 mg | Freq: Once | INTRAMUSCULAR | Status: DC
  Filled 2022-10-27: qty 2

## 2022-10-27 MED ORDER — LOPERAMIDE HCL 2 MG PO TABS: 2.0000 mg | ORAL_TABLET | Freq: Four times a day (QID) | ORAL | 0 refills | Status: DC | PRN

## 2022-10-27 MED ORDER — IOHEXOL 300 MG/ML  SOLN
100.0000 mL | Freq: Once | INTRAMUSCULAR | Status: AC | PRN
  Administered 2022-10-27: 100 mL via INTRAVENOUS

## 2022-10-27 MED ORDER — MORPHINE SULFATE (PF) 4 MG/ML IV SOLN
6.0000 mg | Freq: Once | INTRAVENOUS | Status: AC
  Administered 2022-10-27: 6 mg via INTRAVENOUS
  Filled 2022-10-27: qty 2

## 2022-10-27 NOTE — ED Provider Triage Note (Signed)
Emergency Medicine Provider Triage Evaluation Note  Bradley Morrison, a 47 y.o. male  was evaluated in triage.  Pt complains of lower abdominal pain, malodorous belching. He notes onset on Friday. He reports diarrhea, but denies, NV.   Review of Systems  Positive: Abd pain, belching Negative: FCS  Physical Exam  BP (!) 147/108 (BP Location: Left Arm)   Pulse (!) 103   Temp 98.3 F (36.8 C) (Oral)   Resp 16   Ht 5\' 6"  (1.676 m)   Wt 100.2 kg   SpO2 94%   BMI 35.67 kg/m  Gen:   Awake, no distress  NAD Resp:  Normal effort  MSK:   Moves extremities without difficulty  Other:    Medical Decision Making  Medically screening exam initiated at 11:48 AM.  Appropriate orders placed.  Bradley Morrison was informed that the remainder of the evaluation will be completed by another provider, this initial triage assessment does not replace that evaluation, and the importance of remaining in the ED until their evaluation is complete.  Presents to the ED for evaluation of lower abdominal pain.  He reports the ED with symptoms that began on Friday.    Melvenia Needles, PA-C 10/27/22 1152

## 2022-10-27 NOTE — ED Provider Notes (Signed)
[facility] Provider Note    Event Date/Time   First MD Initiated Contact with Patient 10/27/22 1422     (approximate)   History   Abdominal Pain   HPI  Bradley Morrison is a 47 y.o. male here with abdominal pain, nausea, vomiting, diarrhea.  Patient states that over the last 2 days, he has had severe, aching, cramping, diffuse abdominal pain.  He has had vomiting.  He has had loose, watery, nonbloody diarrhea.  Denies known sick contacts.  States that symptoms have persisted throughout the last 24 hours and got worse overnight so he presents for evaluation.  He is had minimal to no p.o. intake today.  Denies any fevers.  He has had some chills.  No urinary symptoms.     Physical Exam   Triage Vital Signs: ED Triage Vitals  Enc Vitals Group     BP 10/27/22 1146 (!) 147/108     Pulse Rate 10/27/22 1146 (!) 103     Resp 10/27/22 1146 16     Temp 10/27/22 1146 98.3 F (36.8 C)     Temp Source 10/27/22 1146 Oral     SpO2 10/27/22 1146 94 %     Weight 10/27/22 1147 221 lb (100.2 kg)     Height 10/27/22 1147 5\' 6"  (1.676 m)     Head Circumference --      Peak Flow --      Pain Score 10/27/22 1146 10     Pain Loc --      Pain Edu? --      Excl. in [place]? --     Most recent vital signs: Vitals:   10/27/22 1146  BP: (!) 147/108  Pulse: (!) 103  Resp: 16  Temp: 98.3 F (36.8 C)  SpO2: 94%     General: Awake, no distress.  CV:  Good peripheral perfusion.  Mild tachycardia. Resp:  Normal effort.  Lungs clear Abd:  Mild diffuse tenderness.  Exam somewhat limited by habitus.  Hyperactive bowel sounds. Other:  Mildly dry mucous membranes.   ED Results / Procedures / Treatments   Labs (all labs ordered are listed, but only abnormal results are displayed) Labs Reviewed  COMPREHENSIVE METABOLIC PANEL - Abnormal; Notable for the following components:      Result Value   Glucose, Bld 352 (*)    BUN 21 (*)    Calcium 8.7 (*)    ALT 51 (*)    All  other components within normal limits  CBC - Abnormal; Notable for the following components:   WBC 10.6 (*)    RBC 6.35 (*)    Hemoglobin 17.5 (*)    HCT 55.2 (*)    All other components within normal limits  URINALYSIS, ROUTINE W REFLEX MICROSCOPIC - Abnormal; Notable for the following components:   Color, Urine YELLOW (*)    APPearance HAZY (*)    Specific Gravity, Urine 1.033 (*)    Glucose, UA >=500 (*)    Protein, ur 100 (*)    All other components within normal limits  LIPASE, BLOOD     EKG    RADIOLOGY CT abdomen/pelvis: Fluid distended small bowel with air-fluid levels consistent with ileus or enteritis   I also independently reviewed and agree with radiologist interpretations.   PROCEDURES:  Critical Care performed: No   MEDICATIONS ORDERED IN ED: Medications  dicyclomine (BENTYL) capsule 20 mg (has no administration in time range)  ketorolac (TORADOL) 30 MG/ML injection 15  mg (has no administration in time range)  alum & mag hydroxide-simeth (MAALOX/MYLANTA) 200-200-20 MG/5ML suspension 30 mL (has no administration in time range)  sodium chloride 0.9 % bolus 1,000 mL (has no administration in time range)  sodium chloride 0.9 % bolus 1,000 mL (1,000 mLs Intravenous New Bag/Given 10/27/22 1436)  morphine (PF) 4 MG/ML injection 6 mg (6 mg Intravenous Given 10/27/22 1438)  ondansetron (ZOFRAN) injection 4 mg (4 mg Intravenous Given 10/27/22 1438)  iohexol (OMNIPAQUE) 300 MG/ML solution 100 mL (100 mLs Intravenous Contrast Given 10/27/22 1447)     IMPRESSION / MDM / ASSESSMENT AND PLAN / ED COURSE  I reviewed the triage vital signs and the nursing notes.                              Differential diagnosis includes, but is not limited to, viral GI illness, diverticulitis, enteritis, cholecystitis, appendicitis, bowel obstruction, IBS, IBD  Patient's presentation is most consistent with acute presentation with potential threat to life or bodily  function.   47 year old male with history of hypertension, diabetes here with diffuse abdominal pain, nausea, vomiting, and diarrhea.  Suspect viral GI illness or possible foodborne illness.  Patient has hyperactive bowel sounds and diffuse tenderness, so CT scan was obtained and shows diffuse distended small bowel with air-fluid levels but no evidence of high-grade obstruction.  Findings suggest ileus or enteritis per radiology.  Appendix is normal.  CBC with mild likely reactive leukocytosis and he appears mildly hemoconcentrated.  Lipase is normal.  Urinalysis with concentration but otherwise unremarkable.  Glucose 35 2, but normal anion gap and no evidence of DKA.  Patient is known diabetic.  Patient given IV fluids, antiemetics, symptomatic control.  Plan to p.o. challenge, discharge once  feeling better.     FINAL CLINICAL IMPRESSION(S) / ED DIAGNOSES   Final diagnoses:  Enteritis  Dehydration     Rx / DC Orders   ED Discharge Orders          Ordered    ondansetron (ZOFRAN-ODT) 4 MG disintegrating tablet  Every 8 hours PRN        10/27/22 1535    dicyclomine (BENTYL) 20 MG tablet  3 times daily PRN        10/27/22 1535    loperamide (IMODIUM A-D) 2 MG tablet  4 times daily PRN        10/27/22 1535             Note:  This document was prepared using Dragon voice recognition software and may include unintentional dictation errors.   Shaune Pollack, MD 10/27/22 1536

## 2022-10-27 NOTE — ED Provider Notes (Signed)
Emergency department handoff note  Care of this patient was signed out to me at the end of the previous provider shift.  All pertinent patient information was conveyed and all questions were answered.  Patient pending p.o. challenge.  Patient able to tolerate fluids without difficulty however did not feel like eating the crackers that were provided.  Patient agrees with plan for discharge home at this time. The patient has been reexamined and is ready to be discharged.  All diagnostic results have been reviewed and discussed with the patient/family.  Care plan has been outlined and the patient/family understands all current diagnoses, results, and treatment plans.  There are no new complaints, changes, or physical findings at this time.  All questions have been addressed and answered.  All medications, if any, that were given while in the emergency department or any that are being prescribed have been reviewed with the patient/family.  All side effects and adverse reactions have been explained.  Patient was instructed to, and agrees to follow-up with their primary care physician as well as return to the emergency department if any new or worsening symptoms develop.   Naaman Plummer, MD 10/27/22 [phone]

## 2022-10-27 NOTE — ED Triage Notes (Signed)
Pt reports severe abd pain, states that he woke up at 0200 Monday am, states has been hurting since, reports some diarrhea, denies any hx of intestinal issues, pt reports rlq pain that radiates across his abd, pt states that his belching smells like devilded eggs and reports it tastes like stool, last normal bm was 2 days ago, pt reports decreased passing flatus. States that he does have his appendix

## 2022-10-27 NOTE — ED Notes (Signed)
Pt able to tolerate fluids without difficulty, did not want to eat crackers provided.

## 2022-12-21 NOTE — Progress Notes (Signed)
No show for appointment. Office will call to reschedule.  

## 2022-12-22 ENCOUNTER — Ambulatory Visit: Payer: 59 | Admitting: Internal Medicine

## 2022-12-22 DIAGNOSIS — Z91199 Patient's noncompliance with other medical treatment and regimen due to unspecified reason: Secondary | ICD-10-CM

## 2023-04-16 ENCOUNTER — Other Ambulatory Visit: Payer: Self-pay

## 2023-04-16 ENCOUNTER — Emergency Department
Admission: EM | Admit: 2023-04-16 | Discharge: 2023-04-16 | Disposition: A | Discharge status: Home / Self Care | Payer: Medicare HMO | Attending: Emergency Medicine | Admitting: Emergency Medicine

## 2023-04-16 ENCOUNTER — Emergency Department: Payer: Medicare HMO

## 2023-04-16 DIAGNOSIS — L03114 Cellulitis of left upper limb: Secondary | ICD-10-CM | POA: Insufficient documentation

## 2023-04-16 DIAGNOSIS — L0390 Cellulitis, unspecified: Secondary | ICD-10-CM

## 2023-04-16 DIAGNOSIS — R2232 Localized swelling, mass and lump, left upper limb: Secondary | ICD-10-CM | POA: Diagnosis present

## 2023-04-16 MED ORDER — DOXYCYCLINE HYCLATE 100 MG PO CAPS: 100.0000 mg | ORAL_CAPSULE | Freq: Two times a day (BID) | ORAL | 0 refills | Status: AC
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
## 2023-04-16 NOTE — Discharge Instructions (Addendum)
Please seek medical attention for any high fevers, chest pain, shortness of breath, change in behavior, persistent vomiting, bloody stool or any other new or concerning symptoms.  

## 2023-04-16 NOTE — ED Triage Notes (Addendum)
Pt to ed from home via POV for swelling in the left hand that he noticed late last night. Pt is CAOx4, in no acute distress and ambulatory in triage. Pt denies any trauma, swelling was sudden onset and is slightly painful. Pt has good pulses in wrist and able to move it.

## 2023-05-17 ENCOUNTER — Other Ambulatory Visit: Payer: Self-pay

## 2023-05-17 ENCOUNTER — Encounter: Payer: Self-pay | Admitting: Emergency Medicine

## 2023-05-17 DIAGNOSIS — G930 Cerebral cysts: Secondary | ICD-10-CM | POA: Diagnosis not present

## 2023-05-17 DIAGNOSIS — R519 Headache, unspecified: Secondary | ICD-10-CM | POA: Diagnosis present

## 2023-05-17 NOTE — ED Triage Notes (Addendum)
EMS brings pt in from home for c/o generalized HA x 3wks; to triage via w/c with no distress noted; denies any accomp symptoms; last ds ibuprofen at 915pm without relief; pt st has been off BP meds "for awhile" but just started back today

## 2023-05-18 ENCOUNTER — Emergency Department: Payer: Medicare HMO

## 2023-05-18 ENCOUNTER — Emergency Department
Admission: EM | Admit: 2023-05-18 | Discharge: 2023-05-18 | Disposition: A | Discharge status: Home / Self Care | Payer: Medicare HMO | Attending: Emergency Medicine | Admitting: Emergency Medicine

## 2023-05-18 DIAGNOSIS — G930 Cerebral cysts: Secondary | ICD-10-CM | POA: Diagnosis not present

## 2023-05-18 DIAGNOSIS — I639 Cerebral infarction, unspecified: Secondary | ICD-10-CM

## 2023-05-18 DIAGNOSIS — R519 Headache, unspecified: Secondary | ICD-10-CM

## 2023-05-18 HISTORY — DX: Cerebral infarction, unspecified: I63.9

## 2023-05-18 LAB — CBC WITH DIFFERENTIAL/PLATELET
Abs Immature Granulocytes: 0.02 10*3/uL (ref 0.00–0.07)
Basophils Absolute: 0.1 10*3/uL (ref 0.0–0.1)
Basophils Relative: 1 %
Eosinophils Absolute: 0.3 10*3/uL (ref 0.0–0.5)
Eosinophils Relative: 3 %
HCT: 45.3 % (ref 39.0–52.0)
Hemoglobin: 14.7 g/dL (ref 13.0–17.0)
Immature Granulocytes: 0 %
Lymphocytes Relative: 29 %
Lymphs Abs: 2.7 10*3/uL (ref 0.7–4.0)
MCH: 28.4 pg (ref 26.0–34.0)
MCHC: 32.5 g/dL (ref 30.0–36.0)
MCV: 87.6 fL (ref 80.0–100.0)
Monocytes Absolute: 0.9 10*3/uL (ref 0.1–1.0)
Monocytes Relative: 10 %
Neutro Abs: 5.3 10*3/uL (ref 1.7–7.7)
Neutrophils Relative %: 57 %
Platelets: 240 10*3/uL (ref 150–400)
RBC: 5.17 MIL/uL (ref 4.22–5.81)
RDW: 14.3 % (ref 11.5–15.5)
WBC: 9.2 10*3/uL (ref 4.0–10.5)
nRBC: 0 % (ref 0.0–0.2)

## 2023-05-18 LAB — COMPREHENSIVE METABOLIC PANEL
ALT: 17 U/L (ref 0–44)
AST: 15 U/L (ref 15–41)
Albumin: 4.1 g/dL (ref 3.5–5.0)
Alkaline Phosphatase: 74 U/L (ref 38–126)
Anion gap: 7 (ref 5–15)
BUN: 17 mg/dL (ref 6–20)
CO2: 27 mmol/L (ref 22–32)
Calcium: 8.6 mg/dL — ABNORMAL LOW (ref 8.9–10.3)
Chloride: 107 mmol/L (ref 98–111)
Creatinine, Ser: 0.82 mg/dL (ref 0.61–1.24)
GFR, Estimated: >60 mL/min (ref >60)
Glucose, Bld: 126 mg/dL — ABNORMAL HIGH (ref 70–99)
Potassium: 3.3 mmol/L — ABNORMAL LOW (ref 3.5–5.1)
Sodium: 141 mmol/L (ref 135–145)
Total Bilirubin: 0.5 mg/dL (ref 0.3–1.2)
Total Protein: 7.3 g/dL (ref 6.5–8.1)

## 2023-05-18 MED ORDER — KETOROLAC TROMETHAMINE 30 MG/ML IJ SOLN
15.0000 mg | Freq: Once | INTRAMUSCULAR | Status: AC
  Administered 2023-05-18: 15 mg via INTRAVENOUS
  Filled 2023-05-18: qty 1

## 2023-05-18 MED ORDER — ACETAMINOPHEN 500 MG PO TABS
1000.0000 mg | ORAL_TABLET | Freq: Once | ORAL | Status: AC
  Administered 2023-05-18: 1000 mg via ORAL
  Filled 2023-05-18: qty 2

## 2023-05-18 NOTE — ED Notes (Signed)
Pt has visitor in the room and she states that she has concerns that he's slurring his speech. Pt has no stroke s/s, NIH scale done, normal. Will Notify Dr. Rosalia Hammers.

## 2023-05-18 NOTE — Discharge Instructions (Addendum)
You were seen in the ER today for evaluation of your headache. Your exam here was fortunately reassuring. Please arrange follow-up with a primary care doctor for reevaluation.  You can also discuss your CT findings with them.  I have included the radiology report below.  Return to the ER if you develop new or worsening headache, fever, neck stiffness, changes in vision, difficulty walking, weakness, dizziness, confusion, vomiting, numbness, tingling, or any other new or concerning symptoms that you believe warrants immediate attention.    CLINICAL DATA:  Headache.   EXAM: CT HEAD WITHOUT CONTRAST   TECHNIQUE: Contiguous axial images were obtained from the base of the skull through the vertex without intravenous contrast.   RADIATION DOSE REDUCTION: This exam was performed according to the departmental dose-optimization program which includes automated exposure control, adjustment of the mA and/or kV according to patient size and/or use of iterative reconstruction technique.   COMPARISON:  None Available.   FINDINGS: Brain: The ventricles and sulci appropriate size for patient's age. The gray-white matter discrimination is preserved. There is no acute intracranial hemorrhage. No mass effect or midline shift. There is apparent mild separation of the cerebellar tonsil versus less likely mild hyperplasia. The fourth ventricle extends slightly posteriorly. Mild enlargement of the posterior fossa. 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 Mild Dandy-Walker is not excluded. Further evaluation with MRI on a nonemergent/outpatient basis recommended.   Vascular: No hyperdense vessel or unexpected calcification.   Skull: Normal. Negative for fracture or focal lesion.   Sinuses/Orbits: No acute finding.   Other: A 1.7 cm partially calcified ovoid lesion in the frontal scalp as well as a smaller scalp lesion over the left parietal calvarial, likely sebaceous cysts.    IMPRESSION: 1. No acute intracranial pathology. 2. Possible small posterior fossa cyst versus other congenital malformation. Further evaluation with MRI on a nonemergent/outpatient basis recommended.     Electronically Signed   By: Elgie Collard M.D.   On: 05/18/2023 02:12

## 2023-05-18 NOTE — ED Provider Notes (Signed)
[facility] Provider Note    Event Date/Time   First MD Initiated Contact with Patient 05/18/23 0036     (approximate)   History   Headache   HPI  Bradley Morrison is a 47 y.o. male presenting to the emergency department for evaluation for evaluation of a headache.  A few weeks ago he began to develop a generalized headache, not sudden in onset.  No associated numbness, tingling, weakness.  He has been taking ibuprofen which resolves his headache, but does have recurrence after this.  He does note that he had been off of his blood pressure medication for a while and recently restarted it and does think that his headaches may be related to his blood pressure.  He does not have any associated nausea or vomiting.  No neck stiffness.  No recent trauma.  No vision changes.  No fevers.    Physical Exam   Triage Vital Signs: ED Triage Vitals  Encounter Vitals Group     BP 05/17/23 2358 (!) 190/109     Systolic BP Percentile --      Diastolic BP Percentile --      Pulse Rate 05/17/23 2358 76     Resp 05/17/23 2358 18     Temp 05/17/23 2358 98.4 F (36.9 C)     Temp Source 05/17/23 2358 Oral     SpO2 05/17/23 2353 95 %     Weight 05/17/23 2356 240 lb (108.9 kg)     Height 05/17/23 2356 6\' 2"  (1.88 m)     Head Circumference --      Peak Flow --      Pain Score 05/17/23 2356 5     Pain Loc --      Pain Education --      Exclude from Growth Chart --     Most recent vital signs: Vitals:   05/18/23 0200 05/18/23 0230  BP: (!) 167/106 (!) 168/101  Pulse: 64 63  Resp:    Temp:    SpO2:       General: Awake, interactive  CV:  Regular rate, good peripheral perfusion.  Resp:  Lungs clear, unlabored respirations.  Abd:  Soft, nondistended.  Neuro:  Alert and oriented, normal extraocular movements, symmetric facial movement, sensation intact over bilateral upper and lower extremities with 5 out of 5 strength.  Normal finger-to-nose testing.   ED Results  / Procedures / Treatments   Labs (all labs ordered are listed, but only abnormal results are displayed) Labs Reviewed  COMPREHENSIVE METABOLIC PANEL - Abnormal; Notable for the following components:      Result Value   Potassium 3.3 (*)    Glucose, Bld 126 (*)    Calcium 8.6 (*)    All other components within normal limits  CBC WITH DIFFERENTIAL/PLATELET     EKG EKG independently reviewed interpreted by myself (ER attending) demonstrates:    RADIOLOGY Imaging independently reviewed and interpreted by myself demonstrates:  CT head without acute bleed.  Radiology does note a possible posterior fossa cyst versus other congenital malformation, recommends nonemergent MRI.  PROCEDURES:  Critical Care performed: No  Procedures   MEDICATIONS ORDERED IN ED: Medications  acetaminophen (TYLENOL) tablet 1,000 mg (1,000 mg Oral Given 05/18/23 0125)  ketorolac (TORADOL) 30 MG/ML injection 15 mg (15 mg Intravenous Given 05/18/23 0243)     IMPRESSION / MDM / ASSESSMENT AND PLAN / ED COURSE  I reviewed the triage vital signs and the nursing notes.  Differential diagnosis includes, but is not limited to, hypertensive emergency with intracranial bleed, hypertension associated headache without acute intracranial process, no focal deficits, fevers suggestive of other emergent pathology  Patient's presentation is most consistent with acute presentation with potential threat to life or bodily function.  47 year old male presenting to the emergency department for evaluation of headache.  Hypertensive on presentation here, somewhat improved without intervention.  Labs without severe derangement.  CT head without acute bleed.  Patient given Tylenol for pain control read after negative head CT, was also ordered for Toradol.  On reevaluation, patient reports improvement in his headache.  I did discuss his posterior fossa findings on CT.  Did recommend outpatient follow-up for further evaluation.   Patient is comfortable with discharge home.  Strict return precautions provided.     FINAL CLINICAL IMPRESSION(S) / ED DIAGNOSES   Final diagnoses:  Acute nonintractable headache, unspecified headache type  Cyst of posterior cranial fossa     Rx / DC Orders   ED Discharge Orders     None        Note:  This document was prepared using Dragon voice recognition software and may include unintentional dictation errors.   Trinna Post, MD 05/18/23 [phone]

## 2023-05-27 ENCOUNTER — Inpatient Hospital Stay
Admission: EM | Admit: 2023-05-27 | Discharge: 2023-06-02 | DRG: 065 | Disposition: A | Discharge status: Rehabilitation Facility | Payer: Medicare HMO | Attending: Internal Medicine | Admitting: Internal Medicine

## 2023-05-27 ENCOUNTER — Emergency Department: Payer: Medicare HMO

## 2023-05-27 DIAGNOSIS — R29704 NIHSS score 4: Secondary | ICD-10-CM | POA: Diagnosis not present

## 2023-05-27 DIAGNOSIS — R471 Dysarthria and anarthria: Secondary | ICD-10-CM | POA: Diagnosis present

## 2023-05-27 DIAGNOSIS — Z7984 Long term (current) use of oral hypoglycemic drugs: Secondary | ICD-10-CM

## 2023-05-27 DIAGNOSIS — I639 Cerebral infarction, unspecified: Principal | ICD-10-CM

## 2023-05-27 DIAGNOSIS — Z888 Allergy status to other drugs, medicaments and biological substances status: Secondary | ICD-10-CM

## 2023-05-27 DIAGNOSIS — Z6834 Body mass index (BMI) 34.0-34.9, adult: Secondary | ICD-10-CM

## 2023-05-27 DIAGNOSIS — R29810 Facial weakness: Secondary | ICD-10-CM | POA: Diagnosis not present

## 2023-05-27 DIAGNOSIS — E785 Hyperlipidemia, unspecified: Secondary | ICD-10-CM | POA: Diagnosis present

## 2023-05-27 DIAGNOSIS — Z7902 Long term (current) use of antithrombotics/antiplatelets: Secondary | ICD-10-CM

## 2023-05-27 DIAGNOSIS — G8191 Hemiplegia, unspecified affecting right dominant side: Secondary | ICD-10-CM | POA: Diagnosis present

## 2023-05-27 DIAGNOSIS — I459 Conduction disorder, unspecified: Secondary | ICD-10-CM | POA: Diagnosis present

## 2023-05-27 DIAGNOSIS — R29702 NIHSS score 2: Secondary | ICD-10-CM | POA: Diagnosis not present

## 2023-05-27 DIAGNOSIS — R29707 NIHSS score 7: Secondary | ICD-10-CM | POA: Diagnosis not present

## 2023-05-27 DIAGNOSIS — F17210 Nicotine dependence, cigarettes, uncomplicated: Secondary | ICD-10-CM | POA: Diagnosis present

## 2023-05-27 DIAGNOSIS — R5381 Other malaise: Secondary | ICD-10-CM | POA: Diagnosis present

## 2023-05-27 DIAGNOSIS — R1310 Dysphagia, unspecified: Secondary | ICD-10-CM | POA: Diagnosis present

## 2023-05-27 DIAGNOSIS — E669 Obesity, unspecified: Secondary | ICD-10-CM | POA: Diagnosis present

## 2023-05-27 DIAGNOSIS — E1142 Type 2 diabetes mellitus with diabetic polyneuropathy: Secondary | ICD-10-CM | POA: Diagnosis present

## 2023-05-27 DIAGNOSIS — R4701 Aphasia: Secondary | ICD-10-CM | POA: Diagnosis present

## 2023-05-27 DIAGNOSIS — F419 Anxiety disorder, unspecified: Secondary | ICD-10-CM | POA: Diagnosis present

## 2023-05-27 DIAGNOSIS — G809 Cerebral palsy, unspecified: Secondary | ICD-10-CM | POA: Diagnosis present

## 2023-05-27 DIAGNOSIS — I6329 Cerebral infarction due to unspecified occlusion or stenosis of other precerebral arteries: Principal | ICD-10-CM | POA: Diagnosis present

## 2023-05-27 DIAGNOSIS — R29703 NIHSS score 3: Secondary | ICD-10-CM | POA: Diagnosis not present

## 2023-05-27 DIAGNOSIS — I69354 Hemiplegia and hemiparesis following cerebral infarction affecting left non-dominant side: Secondary | ICD-10-CM

## 2023-05-27 DIAGNOSIS — F1210 Cannabis abuse, uncomplicated: Secondary | ICD-10-CM | POA: Diagnosis present

## 2023-05-27 DIAGNOSIS — I10 Essential (primary) hypertension: Secondary | ICD-10-CM | POA: Diagnosis present

## 2023-05-27 DIAGNOSIS — D72829 Elevated white blood cell count, unspecified: Secondary | ICD-10-CM | POA: Diagnosis present

## 2023-05-27 DIAGNOSIS — Z79899 Other long term (current) drug therapy: Secondary | ICD-10-CM

## 2023-05-27 HISTORY — DX: Cerebral infarction, unspecified: I63.9

## 2023-05-28 ENCOUNTER — Other Ambulatory Visit: Payer: Self-pay

## 2023-05-28 ENCOUNTER — Observation Stay: Payer: Medicare HMO

## 2023-05-28 ENCOUNTER — Observation Stay: Admit: 2023-05-28 | Payer: Medicare HMO

## 2023-05-28 ENCOUNTER — Emergency Department: Payer: Medicare HMO

## 2023-05-28 DIAGNOSIS — E1142 Type 2 diabetes mellitus with diabetic polyneuropathy: Secondary | ICD-10-CM

## 2023-05-28 DIAGNOSIS — Z6834 Body mass index (BMI) 34.0-34.9, adult: Secondary | ICD-10-CM | POA: Diagnosis not present

## 2023-05-28 DIAGNOSIS — G479 Sleep disorder, unspecified: Secondary | ICD-10-CM | POA: Diagnosis not present

## 2023-05-28 DIAGNOSIS — Z7984 Long term (current) use of oral hypoglycemic drugs: Secondary | ICD-10-CM | POA: Diagnosis not present

## 2023-05-28 DIAGNOSIS — I6329 Cerebral infarction due to unspecified occlusion or stenosis of other precerebral arteries: Secondary | ICD-10-CM | POA: Diagnosis present

## 2023-05-28 DIAGNOSIS — I69354 Hemiplegia and hemiparesis following cerebral infarction affecting left non-dominant side: Secondary | ICD-10-CM | POA: Diagnosis not present

## 2023-05-28 DIAGNOSIS — I639 Cerebral infarction, unspecified: Secondary | ICD-10-CM | POA: Diagnosis not present

## 2023-05-28 DIAGNOSIS — E669 Obesity, unspecified: Secondary | ICD-10-CM | POA: Diagnosis present

## 2023-05-28 DIAGNOSIS — F17210 Nicotine dependence, cigarettes, uncomplicated: Secondary | ICD-10-CM | POA: Diagnosis present

## 2023-05-28 DIAGNOSIS — I6389 Other cerebral infarction: Secondary | ICD-10-CM | POA: Diagnosis not present

## 2023-05-28 DIAGNOSIS — R471 Dysarthria and anarthria: Secondary | ICD-10-CM | POA: Diagnosis present

## 2023-05-28 DIAGNOSIS — R1310 Dysphagia, unspecified: Secondary | ICD-10-CM | POA: Diagnosis present

## 2023-05-28 DIAGNOSIS — E785 Hyperlipidemia, unspecified: Secondary | ICD-10-CM

## 2023-05-28 DIAGNOSIS — R29703 NIHSS score 3: Secondary | ICD-10-CM | POA: Diagnosis not present

## 2023-05-28 DIAGNOSIS — R5381 Other malaise: Secondary | ICD-10-CM | POA: Diagnosis present

## 2023-05-28 DIAGNOSIS — I459 Conduction disorder, unspecified: Secondary | ICD-10-CM | POA: Diagnosis present

## 2023-05-28 DIAGNOSIS — F1210 Cannabis abuse, uncomplicated: Secondary | ICD-10-CM | POA: Diagnosis present

## 2023-05-28 DIAGNOSIS — R29810 Facial weakness: Secondary | ICD-10-CM | POA: Diagnosis present

## 2023-05-28 DIAGNOSIS — D72829 Elevated white blood cell count, unspecified: Secondary | ICD-10-CM | POA: Diagnosis present

## 2023-05-28 DIAGNOSIS — I10 Essential (primary) hypertension: Secondary | ICD-10-CM | POA: Diagnosis present

## 2023-05-28 DIAGNOSIS — G809 Cerebral palsy, unspecified: Secondary | ICD-10-CM | POA: Diagnosis present

## 2023-05-28 DIAGNOSIS — R29707 NIHSS score 7: Secondary | ICD-10-CM | POA: Diagnosis not present

## 2023-05-28 DIAGNOSIS — R29704 NIHSS score 4: Secondary | ICD-10-CM | POA: Diagnosis not present

## 2023-05-28 DIAGNOSIS — I6350 Cerebral infarction due to unspecified occlusion or stenosis of unspecified cerebral artery: Secondary | ICD-10-CM | POA: Diagnosis not present

## 2023-05-28 DIAGNOSIS — R4701 Aphasia: Secondary | ICD-10-CM | POA: Diagnosis present

## 2023-05-28 DIAGNOSIS — R29702 NIHSS score 2: Secondary | ICD-10-CM | POA: Diagnosis not present

## 2023-05-28 DIAGNOSIS — F419 Anxiety disorder, unspecified: Secondary | ICD-10-CM | POA: Diagnosis present

## 2023-05-28 DIAGNOSIS — G8191 Hemiplegia, unspecified affecting right dominant side: Secondary | ICD-10-CM | POA: Diagnosis present

## 2023-05-28 LAB — ECHOCARDIOGRAM COMPLETE BUBBLE STUDY
AR max vel: 2.62 cm2
AV Area VTI: 2.37 cm2
AV Area mean vel: 2.36 cm2
AV Mean grad: 4 mmHg
AV Peak grad: 7.7 mmHg
Ao pk vel: 1.39 m/s
Area-P 1/2: 3.33 cm2
S' Lateral: 3 cm

## 2023-05-28 LAB — LIPID PANEL
Cholesterol: 109 mg/dL (ref 0–200)
HDL: 30 mg/dL — ABNORMAL LOW (ref >40)
LDL Cholesterol: 64 mg/dL (ref 0–99)
Total CHOL/HDL Ratio: 3.6 RATIO
Triglycerides: 77 mg/dL (ref <150)
VLDL: 15 mg/dL (ref 0–40)

## 2023-05-28 LAB — GLUCOSE, CAPILLARY
Glucose-Capillary: 123 mg/dL — ABNORMAL HIGH (ref 70–99)
Glucose-Capillary: 112 mg/dL — ABNORMAL HIGH (ref 70–99)
Glucose-Capillary: 96 mg/dL (ref 70–99)

## 2023-05-28 LAB — HEMOGLOBIN A1C
Hgb A1c MFr Bld: 6.5 % — ABNORMAL HIGH (ref 4.8–5.6)
Mean Plasma Glucose: 139.85 mg/dL

## 2023-05-28 LAB — HIV ANTIBODY (ROUTINE TESTING W REFLEX): HIV Screen 4th Generation wRfx: NONREACTIVE

## 2023-05-28 LAB — CBG MONITORING, ED: Glucose-Capillary: 132 mg/dL — ABNORMAL HIGH (ref 70–99)

## 2023-05-28 MED ORDER — ACETAMINOPHEN 650 MG RE SUPP: 650.0000 mg | RECTAL | Status: DC | PRN

## 2023-05-28 MED ORDER — ATORVASTATIN CALCIUM 80 MG PO TABS
80.0000 mg | ORAL_TABLET | Freq: Every day | ORAL | Status: DC
  Administered 2023-05-28 – 2023-06-02 (×6): 80 mg via ORAL
  Filled 2023-05-28 (×4): qty 1
  Filled 2023-05-28: qty 4
  Filled 2023-05-28: qty 1
  Filled 2023-05-28: qty 4
  Filled 2023-05-28: qty 1

## 2023-05-28 MED ORDER — CITALOPRAM HYDROBROMIDE 20 MG PO TABS: 40.0000 mg | ORAL_TABLET | Freq: Every day | ORAL | Status: DC

## 2023-05-28 MED ORDER — ACETAMINOPHEN 325 MG PO TABS
650.0000 mg | ORAL_TABLET | ORAL | Status: DC | PRN
  Administered 2023-05-30 – 2023-06-02 (×5): 650 mg via ORAL
  Filled 2023-05-28 (×5): qty 2

## 2023-05-28 MED ORDER — SENNOSIDES-DOCUSATE SODIUM 8.6-50 MG PO TABS: 1.0000 | ORAL_TABLET | Freq: Every evening | ORAL | Status: DC | PRN

## 2023-05-28 MED ORDER — CLOPIDOGREL BISULFATE 75 MG PO TABS
75.0000 mg | ORAL_TABLET | Freq: Every day | ORAL | Status: DC
  Administered 2023-05-28 – 2023-06-02 (×6): 75 mg via ORAL
  Filled 2023-05-28 (×6): qty 1

## 2023-05-28 MED ORDER — MORPHINE SULFATE (PF) 2 MG/ML IV SOLN
2.0000 mg | INTRAVENOUS | Status: DC | PRN
  Administered 2023-05-28: 2 mg via INTRAVENOUS
  Filled 2023-05-28: qty 1

## 2023-05-28 MED ORDER — ENOXAPARIN SODIUM 60 MG/0.6ML IJ SOSY
50.0000 mg | PREFILLED_SYRINGE | INTRAMUSCULAR | Status: DC
  Administered 2023-05-28 – 2023-06-02 (×6): 50 mg via SUBCUTANEOUS
  Filled 2023-05-28 (×6): qty 0.6

## 2023-05-28 MED ORDER — STROKE: EARLY STAGES OF RECOVERY BOOK
Freq: Once | Status: AC
  Administered 2023-05-28: 1

## 2023-05-28 MED ORDER — INSULIN ASPART 100 UNIT/ML IJ SOLN
0.0000 [IU] | Freq: Three times a day (TID) | INTRAMUSCULAR | Status: DC
  Administered 2023-05-28 – 2023-05-29 (×2): 1 [IU] via SUBCUTANEOUS
  Filled 2023-05-28: qty 1

## 2023-05-28 MED ORDER — MORPHINE SULFATE (PF) 2 MG/ML IV SOLN: 1.0000 mg | INTRAVENOUS | Status: DC | PRN

## 2023-05-28 MED ORDER — MORPHINE SULFATE (PF) 2 MG/ML IV SOLN
1.0000 mg | INTRAVENOUS | Status: DC | PRN
  Administered 2023-05-28: 1 mg via INTRAVENOUS
  Filled 2023-05-28: qty 1

## 2023-05-28 MED ORDER — AMLODIPINE BESYLATE 10 MG PO TABS
10.0000 mg | ORAL_TABLET | Freq: Every day | ORAL | Status: DC
  Administered 2023-05-28 – 2023-05-30 (×3): 10 mg via ORAL
  Filled 2023-05-28 (×3): qty 1

## 2023-05-28 MED ORDER — IOHEXOL 350 MG/ML SOLN
100.0000 mL | Freq: Once | INTRAVENOUS | Status: AC | PRN
  Administered 2023-05-28: 100 mL via INTRAVENOUS

## 2023-05-28 MED ORDER — ACETAMINOPHEN 160 MG/5ML PO SOLN: 650.0000 mg | ORAL | Status: DC | PRN

## 2023-05-28 MED ORDER — SODIUM CHLORIDE 0.9 % IV SOLN: INTRAVENOUS | Status: DC

## 2023-05-28 MED ORDER — LOSARTAN POTASSIUM 25 MG PO TABS
25.0000 mg | ORAL_TABLET | Freq: Every day | ORAL | Status: DC
  Administered 2023-05-28 – 2023-05-30 (×3): 25 mg via ORAL
  Filled 2023-05-28 (×4): qty 1

## 2023-05-28 MED ORDER — GABAPENTIN 300 MG PO CAPS: 300.0000 mg | ORAL_CAPSULE | Freq: Three times a day (TID) | ORAL | Status: DC

## 2023-05-28 MED ORDER — LORAZEPAM 2 MG/ML IJ SOLN
0.5000 mg | Freq: Once | INTRAMUSCULAR | Status: AC
  Administered 2023-05-28: 0.5 mg via INTRAVENOUS
  Filled 2023-05-28: qty 1

## 2023-05-28 MED ORDER — DAPAGLIFLOZIN PROPANEDIOL 10 MG PO TABS: 10.0000 mg | ORAL_TABLET | Freq: Every day | ORAL | Status: DC

## 2023-05-28 NOTE — Progress Notes (Signed)
Stroke book provided to patient and mother at bedside.

## 2023-05-28 NOTE — ED Provider Notes (Signed)
[facility] Provider Note    Event Date/Time   First MD Initiated Contact with Patient 05/27/23 2355     (approximate)   History   Code Stroke, Aphasia, Weakness (R), Dysphagia, and Facial Droop (R)   HPI  Bradley Morrison is a 47 y.o. male   Past medical history of cerebral palsy, hypertension, anxiety and prior stroke who presents as a stroke code.  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  At around 8 PM he had worsening of his speech disturbance, slurred speech, and a new right sided facial weakness.  He has ongoing left-sided motor deficits from old stroke.  He has no other acute medical complaints.   External Medical Documents Reviewed: Outpatient family medicine office visit from 05/25/2023 for follow-up after hospitalization for stroke      Physical Exam   Triage Vital Signs: ED Triage Vitals  Encounter Vitals Group     BP 05/28/23 0007 (!) 160/96     Systolic BP Percentile --      Diastolic BP Percentile --      Pulse Rate 05/28/23 0007 78     Resp 05/28/23 0007 18     Temp 05/28/23 0007 (!) 97.5 F (36.4 C)     Temp Source 05/28/23 0007 Oral     SpO2 05/28/23 0007 98 %     Weight 05/28/23 0009 211 lb 8 oz (95.9 kg)     Height 05/28/23 0009 5\' 6"  (1.676 m)     Head Circumference --      Peak Flow --      Pain Score 05/28/23 0009 0     Pain Loc --      Pain Education --      Exclude from Growth Chart --     Most recent vital signs: Vitals:   05/28/23 0007 05/28/23 0030  BP: (!) 160/96 (!) 167/100  Pulse: 78 73  Resp: 18 18  Temp: (!) 97.5 F (36.4 C) (!) 97.5 F (36.4 C)  SpO2: 98% 93%    General: Awake, no distress.  CV:  Good peripheral perfusion.  Resp:  Normal effort.  Abd:  No distention.  Other:  Conversant patient with dysarthria/slurred speech, left-sided arm and leg weakness, right-sided facial droop.  Lungs clear, abdomen soft  nontender.  Appears euvolemic.  Hypertensive.   ED Results / Procedures / Treatments   Labs (all labs ordered are listed, but only abnormal results are displayed) Labs Reviewed  CBC - Abnormal; Notable for the following components:      Result Value   WBC 12.4 (*)    All other components within normal limits  DIFFERENTIAL - Abnormal; Notable for the following components:   Neutro Abs 7.8 (*)    Monocytes Absolute 1.5 (*)    All other components within normal limits  COMPREHENSIVE METABOLIC PANEL - Abnormal; Notable for the following components:   Glucose, Bld 138 (*)    Total Protein 8.4 (*)    All other components within normal limits  URINE DRUG SCREEN, QUALITATIVE ([facility] ONLY) - Abnormal; Notable for the following components:   Cannabinoid 50 Ng, Ur [place] POSITIVE (*)    All other components within normal limits  URINALYSIS, ROUTINE W REFLEX MICROSCOPIC - Abnormal; Notable for the following components:   Color, Urine YELLOW (*)    APPearance CLEAR (*)    All other components within normal limits  CBG MONITORING, ED - Abnormal; Notable for the following components:   Glucose-Capillary 132 (*)    All other components within normal limits  ETHANOL  PROTIME-INR  APTT     I ordered and reviewed the above labs they are notable for white blood cell count mildly elevated 12  EKG  ED ECG REPORT I, Pilar Jarvis, the attending physician, personally viewed and interpreted this ECG.   Date: 05/28/2023  EKG Time:   Rate: 0002  Rhythm: sinus  Axis: nl  Intervals:none  ST&T Change: no stemi    RADIOLOGY I independently reviewed and interpreted CT scan of the head and see no obvious bleeding or midline shift I also reviewed radiologist's formal read.   PROCEDURES:  Critical Care performed: Yes, see critical care procedure note(s)  .Critical Care  Performed by: Pilar Jarvis, MD Authorized by: Pilar Jarvis, MD   Critical care provider statement:    Critical care time  (minutes):  30   Critical care was time spent personally by me on the following activities:  Development of treatment plan with patient or surrogate, discussions with consultants, evaluation of patient's response to treatment, examination of patient, ordering and review of laboratory studies, ordering and review of radiographic studies, ordering and performing treatments and interventions, pulse oximetry, re-evaluation of patient's condition and review of old charts    MEDICATIONS ORDERED IN ED: Medications  iohexol (OMNIPAQUE) 350 MG/ML injection 100 mL (100 mLs Intravenous Contrast Given 05/28/23 0047)    External physician / consultants:  I spoke with telestroke neurologist Dr. Boston Service regarding care plan for this patient.   IMPRESSION / MDM / ASSESSMENT AND PLAN / ED COURSE  I reviewed the triage vital signs and the nursing notes.                                Patient's presentation is most consistent with acute presentation with potential threat to life or bodily function.  Differential diagnosis includes, but is not limited to, CVA, recrudescence of prior stroke   The patient is on the cardiac monitor to evaluate for evidence of arrhythmia and/or significant heart rate changes.  MDM:    Is a patient with recent stroke and new right-sided facial deficits concerning for stroke.  Dr. Darra Lis present for teleneurology telestroke consultation, CT and CTA head neck negative for large vessel occlusion or significant stenosis, bleeding, no tPA due to recent stroke, admit.  Switch from aspirin to Plavix per neurology recommendation.         FINAL CLINICAL IMPRESSION(S) / ED DIAGNOSES   Final diagnoses:  Acute ischemic stroke (HCC)     Rx / DC Orders   ED Discharge Orders     None        Note:  This document was prepared using Dragon voice recognition software and may include unintentional dictation errors.    Pilar Jarvis, MD 05/28/23 [phone]

## 2023-05-28 NOTE — Progress Notes (Addendum)
Elert at 2350.  Patient in CT at that time, provider at bedside during elert.  Patient with right facial droop and dysarthria, Left side weakness. Patient LKW at 2100.  Back from CT at 2359.  TS paged at 0002.  Dr. Boston Service on camera at 0006. mRs 2 .   Not a candiadate for TNK due to recent stroke.  Dr. Boston Service to follow up with EDMD with recommendations.  Sent back for further imaging. Off camera at 0028, and monitoring for further imaging.  Secure message sent to Dr. Boston Service at [phone] with radiology results, Dr. Boston Service follow up with EDMD with recommendations.

## 2023-05-28 NOTE — Evaluation (Addendum)
Clinical/Bedside Swallow Evaluation Patient Details  Name: Bradley Morrison MRN: [id number] Date of Birth: 1976-09-03  Today's Date: 05/28/2023 Time: SLP Start Time (ACUTE ONLY): 0955 SLP Stop Time (ACUTE ONLY): 1055 SLP Time Calculation (min) (ACUTE ONLY): 60 min  Past Medical History:  Past Medical History:  Diagnosis Date   Anxiety    CP (cerebral palsy) (HCC)    Hypertension    Stroke [facility])    Past Surgical History: History reviewed. No pertinent surgical history. HPI:  Pt is a 47 year old male with history of hypertension, hyperlipidemia, diabetes mellitus, cerebral palsy (mainly with some foot problems/deformity), recent Right Pontine stroke in the past week(05/18/23), cared for at [facility] with some residual left-sided weakness and minor speech disturbance; pt denied any difficulty swallowing, only min slower chewing.  Per MD admit note, "apparently this evening around 8 PM, he had worsened speech, mainly increased slurred speech. He also has Right facial weakness. Unclear if his left side is weaker than previously. Apparently symptoms resolved, he went to bed at 9 PM and woke up later with recurrent similar symptoms with the Right facial weakness, altered speech and difficulty swallowing.   On examination, patient has moderate to severe Dysarthria which is definitely worse than previously according to family as well as new right facial weakness. He does have significant left leg weakness, unclear if this is worse than previously. He also has diminished in station of the left limbs. NIHSS = 7. CT head reveals no definite acute findings. Suspect possible new stroke versus recrudescence/worsening of prior stroke deficits. Admit for cerebrovascular workup and management.".     MRI: Brain: Multifocal acute ischemia within the ventral pons. No acute  or chronic hemorrhage. Normal white matter signal, parenchymal  volume and CSF spaces. The midline structures are normal.    Assessment / Plan /  Recommendation  Clinical Impression   Pt seen for BSE this morning. Pt awake, verbal but w/ slow/deliberate speech - Dysarthria+. Pt A/Ox4 and followed all commands. Pt w/ congested cough at Baseline - stated he had a "smoker's cough" and that this had been "going on at home".  On RA, afebrile.   Pt appears to present w/ MOD+ oropharyngeal phase Dysphagia w/ sensorimotor deficits noted. Pt appears to exhibit significant Dysarthria (hypokinetic dysarthria?) also indicating that it has "worsened since my stroke last week"(05/18/2023).   Pt exhibits increased risk for aspiration/aspiration pneumonia in setting of Neurological change/decline. See MRI results from this/recent admit of a R pontine infarct. D/t this risk, an oral diet is NOT recommended at this time. Instead, therapeutic trials/swallowing ex is recommended to engage oropharyngeal swallowing using small tsps of Puree and single ice chips -- both under NSG Supervision and following aspiration precautions.   Pt consumed po trials of thin and Nectar liquids w/ both immediate/delayed, overt clinical s/s of aspiration during the trials c/b coughing, suspected delayed pharyngeal swallow initiation, slow A-P transfer and engagement of swallowing. Hyolaryngeal excursion appeared [facility] per palpation. No wet vocal quality, however, noted slight velopharyngeal incompetency(air escape) during swallow. Noted slight lingual protrusion during the swallow(tongue just past lower teeth). R lingual deviation+. No overt clinical s/s of aspiration noted w/ puree trials; 1 overt cough w/ single ice chips. Oral phase deficits apparent c/b Slow bolus management, mastication, and A-P transfer w/ all bolus consistencies but much less w/ the Puree trials. He exhibited functional control of bolus propulsion w/ no anterior leakage. Oral clearing achieved w/ all trial consistencies.  Suspect CN 5, 7, and 12 damage.  OM Exam Right unilateral deviation; Left lateral weakness.  Posterior tongue strength WFL; velum elevation grossly WFL also. Speech c/b significant Dysarthria(slow, deliberate) but overall intelligible. Pt fed self w/ setup support.   In setting of pt's presentation at this time(~10 hours into admit) and pt's increased risk for aspiration/aspiration pneumonia, recommend continued NPO status for diet BUT therapeutic trials for swallow engagement and exercise w/ small tsps of puree and single ice chips - both under Supervision of NSG using aspiration precautions. Stop po's if any increased s/s of aspiration noted. This was discussed w/ pt/NSG and MD who agreed.  Recommend frequent oral care for hygiene (b/f po's) and for stimulation of swallowing. Precautions posted in room/chart; education given to pt.   ST services will f/u w/ ongoing assessment of swallowing/Dysphagia as well as Dysarthria (brief education given on using Over-articulation of speech for exercise at this time - pt was able to demonstrate w/ success). Suspect objective of swallowing(MBSS) could be indicated as well as ST services f/u post Discharge.  SLP Visit Diagnosis: Dysphagia, oropharyngeal phase (R13.12) (recent CVA; Dysarthria)    Aspiration Risk  Moderate aspiration risk;Severe aspiration risk;Risk for inadequate nutrition/hydration    Diet Recommendation   NPO (therapeutic trials of puree, single ice chips w/ NSG)  Medication Administration: Crushed with puree (as tolerates per NSG)    Other  Recommendations Recommended Consults:  (Neurology f/u; Dietician f/u) Oral Care Recommendations: Oral care BID;Oral care before and after PO;Patient independent with oral care (setup) Caregiver Recommendations:  (TBD)    Recommendations for follow up therapy are one component of a multi-disciplinary discharge planning process, led by the attending physician.  Recommendations may be updated based on patient status, additional functional criteria and insurance authorization.  Follow up  Recommendations Follow physician's recommendations for discharge plan and follow up therapies (next venue of care) - pt could benefit from Inpatient Rehab at D/C     Assistance Recommended at Discharge  Intermittent   Functional Status Assessment Patient has had a recent decline in their functional status and demonstrates the ability to make significant improvements in function in a reasonable and predictable amount of time.  Frequency and Duration min 2x/week  2 weeks       Prognosis Prognosis for improved oropharyngeal function: Fair (-Good) Barriers to Reach Goals: Time post onset;Severity of deficits Barriers/Prognosis Comment: recent CVA      Swallow Study   General Date of Onset: 05/28/23 HPI: Pt is a 47 year old male with history of hypertension, hyperlipidemia, diabetes mellitus, cerebral palsy (mainly with some foot problems/deformity), recent Right Pontine stroke in the past week(05/18/23), cared for at [facility] with some residual left-sided weakness and minor speech disturbance; pt denied any difficulty swallowing, only min slower chewing.  Per MD admit note, "apparently this evening around 8 PM, he had worsened speech, mainly increased slurred speech. He also has Right facial weakness. Unclear if his left side is weaker than previously. Apparently symptoms resolved, he went to bed at 9 PM and woke up later with recurrent similar symptoms with the Right facial weakness, altered speech and difficulty swallowing.   On examination, patient has moderate to severe Dysarthria which is definitely worse than previously according to family as well as new right facial weakness. He does have significant left leg weakness, unclear if this is worse than previously. He also has diminished in station of the left limbs. NIHSS = 7. CT head reveals no definite acute findings. Suspect possible new stroke versus recrudescence/worsening of prior stroke deficits.  Admit for cerebrovascular workup and management.".    MRI: Brain: Multifocal acute ischemia within the ventral pons. No acute  or chronic hemorrhage. Normal white matter signal, parenchymal  volume and CSF spaces. The midline structures are normal. Type of Study: Bedside Swallow Evaluation Previous Swallow Assessment: none Diet Prior to this Study: NPO (regular diet at home per pt) Temperature Spikes Noted: No (wbc 12.4) Respiratory Status: Room air History of Recent Intubation: No Behavior/Cognition: Alert;Cooperative;Pleasant mood (Dysarthria) Oral Cavity Assessment: Dry Oral Care Completed by SLP: Yes Oral Cavity - Dentition: Adequate natural dentition Vision: Functional for self-feeding Self-Feeding Abilities: Able to feed self;Needs assist;Needs set up Patient Positioning: Upright in bed (min supportive upright positioning) Baseline Vocal Quality: Normal Volitional Cough: Strong;Congested Volitional Swallow: Able to elicit (min slow)    Oral/Motor/Sensory Function Overall Oral Motor/Sensory Function: Moderate impairment Facial ROM: Reduced right;Suspected CN VII (facial) dysfunction Facial Symmetry: Abnormal symmetry right;Suspected CN VII (facial) dysfunction Facial Strength: Reduced right;Suspected CN VII (facial) dysfunction Facial Sensation: Reduced right;Suspected CN V (Trigeminal) dysfunction Lingual ROM: Suspected CN XII (hypoglossal) dysfunction (reduced to the Left side(across)) Lingual Symmetry: Abnormal symmetry right;Suspected CN XII (hypoglossal) dysfunction Lingual Strength: Reduced;Suspected CN XII (hypoglossal) dysfunction Lingual Sensation: Reduced;Suspected CN VII (facial) dysfunction-anterior 2/3 tongue Velum: Within Functional Limits (grossly) Mandible: Within Functional Limits   Ice Chips Ice chips: Impaired Presentation: Spoon (fed; 10 trials) Oral Phase Impairments: Reduced lingual movement/coordination;Impaired mastication (slow, deliberate) Oral Phase Functional Implications: Prolonged oral  transit Pharyngeal Phase Impairments: Suspected delayed Swallow;Cough - Immediate (x1/10)   Thin Liquid Thin Liquid: Impaired Presentation: Cup;Self Fed (10 trials) Oral Phase Impairments: Reduced lingual movement/coordination Oral Phase Functional Implications: Prolonged oral transit Pharyngeal  Phase Impairments: Suspected delayed Swallow;Cough - Immediate;Cough - Delayed (x6/10)    Nectar Thick Nectar Thick Liquid: Impaired Presentation: Cup;Self Fed (4 trials) Oral Phase Impairments: Reduced lingual movement/coordination Oral phase functional implications: Prolonged oral transit Pharyngeal Phase Impairments: Suspected delayed Swallow;Cough - Delayed (x1/4)   Honey Thick Honey Thick Liquid: Not tested   Puree Puree: Within functional limits (grossly) Presentation: Self Fed;Spoon (12 trials) Other Comments: WFL bolus management and swallow   Solid     Solid: Not tested       Jerilynn Som, MS, CCC-SLP Speech Language Pathologist Rehab Services; [facility] [phone] (ascom) Chan Sheahan 05/28/2023,1:36 PM

## 2023-05-28 NOTE — Evaluation (Signed)
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 Pt is alert and oriented x4. Prior to previous stroke (approx 2 weeks ago) pt was MOD I-I in ADL/IADL, amb with no AD, after admission to [facility] with CVA amb with RW but was amb community distances, ADLs with supervision-MOD I, assist with IADLs. Pt presents with deficits in strength, activity tolerance, balance, tone all affecting safe and optimal ADL completion. Pt will benefit from acute and post acute OT to address deficits and to facilitate return to PLOF. OT will follow acutely.       If plan is discharge home, recommend the following: A little help with walking and/or transfers;A little help with bathing/dressing/bathroom;Help with stairs or ramp for entrance;Direct supervision/assist for medications management;Assist for transportation;Assistance with cooking/housework    Functional Status Assessment  Patient has had a recent decline in their functional status and demonstrates the ability to make significant improvements in function in a reasonable and predictable amount of time.  Equipment Recommendations  BSC/3in1    Recommendations for Other Services       Precautions / Restrictions Precautions Precautions: Fall Restrictions Weight Bearing Restrictions: No      Mobility Bed Mobility Overal bed mobility: Needs Assistance Bed Mobility: Supine to Sit     Supine to sit: Min assist, HOB elevated           Transfers Overall transfer level: Needs assistance Equipment used: Rolling walker (2 wheels) Transfers: Sit to/from Stand Sit to Stand: Min assist                  Balance Overall balance assessment: Needs assistance Sitting-balance support: Feet supported, No upper extremity supported Sitting balance-Leahy Scale: Good     Standing balance support: Single extremity supported, During functional activity Standing balance-Leahy Scale: Fair                             ADL either performed or assessed with clinical judgement   ADL Overall ADL's : Needs assistance/impaired Eating/Feeding: NPO   Grooming: Wash/dry hands;Wash/dry face;Standing;Minimal assistance       Lower Body Bathing: Sit to/from stand;Minimal assistance   Upper Body Dressing : Minimal assistance;Sitting   Lower Body Dressing: Moderate assistance Lower Body Dressing Details (indicate cue type and reason): donn/doff socks, shorts seated on edge of bed Toilet Transfer: Minimal assistance;Rolling walker (2 wheels);Ambulation   Toileting- Clothing Manipulation and Hygiene: Moderate assistance;Sit to/from stand       Functional mobility during ADLs: Minimal assistance;Moderate assistance;Rolling walker (2 wheels)       Vision Patient Visual Report: No change from baseline Vision Assessment?: Yes Eye Alignment: Within Functional Limits Ocular Range of Motion: Within Functional Limits Alignment/Gaze Preference: Within Defined Limits Tracking/Visual Pursuits: Able to track stimulus in all quads without difficulty Convergence: Within functional limits Visual Fields: No apparent deficits     Perception Perception: Within Functional Limits       Praxis Praxis: [facility]       Pertinent  Vitals/Pain Pain Assessment Pain Assessment: No/denies pain     Extremity/Trunk Assessment Upper Extremity Assessment Upper Extremity Assessment: Overall WFL for tasks assessed;LUE deficits/detail;RUE  deficits/detail RUE Coordination: decreased fine motor   Lower Extremity Assessment Lower Extremity Assessment: LLE deficits/detail;RLE deficits/detail;Defer to PT evaluation (+ clonus LLE) RLE Deficits / Details: MMT gross 4-5/5 RLE Sensation: WNL LLE Deficits / Details: L hip flexion 3/5, L knee ext 3+/5 LLE Sensation: WNL LLE Coordination: decreased gross motor (Unable to maintain alternate heel tapping with increased speed)       Communication Communication Communication: Difficulty communicating thoughts/reduced clarity of speech Cueing Techniques: Verbal cues;Tactile cues   Cognition Arousal: Alert Behavior During Therapy: WFL for tasks assessed/performed Overall Cognitive Status: Impaired/Different from baseline Area of Impairment: Problem solving                             Problem Solving: Requires verbal cues, Requires tactile cues       General Comments  vss throughout    Exercises Other Exercises Other Exercises: edu pt and mom re: role of rehab, role of OT, safe ADL completion   Shoulder Instructions      Home Living Family/patient expects to be discharged to:: Private residence Living Arrangements: Parent Available Help at Discharge: Family;Available 24 hours/day Type of Home: House Home Access: Stairs to enter [employer] of Steps: 5 Entrance Stairs-Rails: Left Home Layout: One level     Bathroom Shower/Tub: Chief Strategy Officer: Standard Bathroom Accessibility: No (has to leave walker at the door and hang on to vanity)   Home Equipment: Agricultural consultant (2 wheels)          Prior Functioning/Environment Prior Level of Function : Independent/Modified Independent             Mobility Comments: 2 weeks ago, pt was IND with ambulation ADLs Comments: approx 2 weeks ago, pt MOD I-I in ADL/IADL, since previous stroke was amb with AD, dressing with MOD I, SET  UP for feeding/grooming, assist for IADLs         OT Problem List: Decreased strength;Decreased activity tolerance;Decreased knowledge of use of DME or AE;Decreased range of motion;Impaired balance (sitting and/or standing);Decreased coordination;Decreased safety awareness;Decreased knowledge of precautions      OT Treatment/Interventions: Self-care/ADL training;Energy conservation;Modalities;Balance training;Therapeutic exercise;DME and/or AE instruction;Neuromuscular education;Manual therapy;Therapeutic activities;Patient/family education    OT Goals(Current goals can be found in the care plan section) Acute Rehab OT Goals Patient Stated Goal: return to PLOF OT Goal Formulation: With patient/family Time For Goal Achievement: 06/11/23 Potential to Achieve Goals: Good ADL Goals Pt Will Perform Grooming: with modified independence Pt Will Perform Lower Body Dressing: with modified independence;sit to/from stand Pt Will Transfer to Toilet: with modified independence;ambulating Pt Will Perform Toileting - Clothing Manipulation and hygiene: with modified independence;sit to/from stand  OT Frequency: Min 1X/week    Co-evaluation PT/OT/SLP Co-Evaluation/Treatment: Yes Reason for Co-Treatment: To address functional/ADL transfers;Complexity of the patient's impairments (multi-system involvement) PT goals addressed during session: Mobility/safety with mobility;Proper use of DME OT goals addressed during session: ADL's and self-care      AM-PAC OT "6 Clicks" Daily Activity     Outcome Measure Help from another person eating meals?: A Little (currently NPO anticipate SET UP if cleared) Help from another person taking care of personal grooming?: None Help from another person toileting, which includes using toliet, bedpan, or urinal?: A Little Help from another person bathing (including washing, rinsing, drying)?:  A Little Help from another person to put on and taking off regular upper body clothing?: A Little Help from another person to put on  and taking off regular lower body clothing?: A Lot 6 Click Score: 18   End of Session Equipment Utilized During Treatment: Rolling walker (2 wheels);Gait belt  Activity Tolerance: Patient tolerated treatment well Patient left: Other (comment) (in care of PT amb into hallway)  OT Visit Diagnosis: Unsteadiness on feet (R26.81);Other abnormalities of gait and mobility (R26.89);Muscle weakness (generalized) (M62.81);Other symptoms and signs involving the nervous system (R29.898)                Time: 1305-1350 OT Time Calculation (min): 45 min Charges:  OT General Charges $OT Visit: 1 Visit OT Evaluation $OT Eval Moderate Complexity: 1 Mod  Oleta Mouse, OTD OTR/L  05/28/23, 4:14 PM

## 2023-05-28 NOTE — ED Notes (Signed)
Pt to MRI at this time.

## 2023-05-28 NOTE — Plan of Care (Signed)
  Problem: Education: Goal: Knowledge of disease or condition will improve Outcome: Progressing Goal: Knowledge of secondary prevention will improve (MUST DOCUMENT ALL) Outcome: Progressing Goal: Knowledge of patient specific risk factors will improve (Mark N/A or DELETE if not current risk factor) Outcome: Progressing   Problem: Ischemic Stroke/TIA Tissue Perfusion: Goal: Complications of ischemic stroke/TIA will be minimized Outcome: Progressing   Problem: Coping: Goal: Will verbalize positive feelings about self Outcome: Progressing Goal: Will identify appropriate support needs Outcome: Progressing   Problem: Health Behavior/Discharge Planning: Goal: Ability to manage health-related needs will improve Outcome: Progressing Goal: Goals will be collaboratively established with patient/family Outcome: Progressing   Problem: Self-Care: Goal: Ability to participate in self-care as condition permits will improve Outcome: Progressing Goal: Verbalization of feelings and concerns over difficulty with self-care will improve Outcome: Progressing Goal: Ability to communicate needs accurately will improve Outcome: Progressing   Problem: Nutrition: Goal: Risk of aspiration will decrease Outcome: Progressing Goal: Dietary intake will improve Outcome: Progressing   Problem: Education: Goal: Knowledge of General Education information will improve Description: Including pain rating scale, medication(s)/side effects and non-pharmacologic comfort measures Outcome: Progressing   Problem: Health Behavior/Discharge Planning: Goal: Ability to manage health-related needs will improve Outcome: Progressing   Problem: Clinical Measurements: Goal: Ability to maintain clinical measurements within normal limits will improve Outcome: Progressing Goal: Will remain free from infection Outcome: Progressing Goal: Diagnostic test results will improve Outcome: Progressing Goal: Respiratory  complications will improve Outcome: Progressing Goal: Cardiovascular complication will be avoided Outcome: Progressing   Problem: Activity: Goal: Risk for activity intolerance will decrease Outcome: Progressing   Problem: Nutrition: Goal: Adequate nutrition will be maintained Outcome: Progressing   Problem: Coping: Goal: Level of anxiety will decrease Outcome: Progressing   Problem: Elimination: Goal: Will not experience complications related to bowel motility Outcome: Progressing Goal: Will not experience complications related to urinary retention Outcome: Progressing   Problem: Pain Managment: Goal: General experience of comfort will improve Outcome: Progressing   Problem: Safety: Goal: Ability to remain free from injury will improve Outcome: Progressing   Problem: Skin Integrity: Goal: Risk for impaired skin integrity will decrease Outcome: Progressing   Problem: Education: Goal: Ability to describe self-care measures that may prevent or decrease complications (Diabetes Survival Skills Education) will improve Outcome: Progressing Goal: Individualized Educational Video(s) Outcome: Progressing   Problem: Coping: Goal: Ability to adjust to condition or change in health will improve Outcome: Progressing   Problem: Fluid Volume: Goal: Ability to maintain a balanced intake and output will improve Outcome: Progressing   Problem: Health Behavior/Discharge Planning: Goal: Ability to identify and utilize available resources and services will improve Outcome: Progressing Goal: Ability to manage health-related needs will improve Outcome: Progressing   Problem: Metabolic: Goal: Ability to maintain appropriate glucose levels will improve Outcome: Progressing   Problem: Nutritional: Goal: Maintenance of adequate nutrition will improve Outcome: Progressing Goal: Progress toward achieving an optimal weight will improve Outcome: Progressing   Problem: Skin  Integrity: Goal: Risk for impaired skin integrity will decrease Outcome: Progressing   Problem: Tissue Perfusion: Goal: Adequacy of tissue perfusion will improve Outcome: Progressing   

## 2023-05-28 NOTE — Progress Notes (Signed)
Inpatient Rehab Admissions Coordinator Note:   Per therapy recommendations patient was screened for CIR candidacy by Stephania Fragmin, PT. At this time, pt appears to be a potential candidate for CIR. I will place an order for rehab consult for full assessment, per our protocol.  Please contact me any with questions.Estill Dooms, PT, DPT [phone] 05/28/23 4:13 PM

## 2023-05-28 NOTE — H&P (Addendum)
[facility]   PATIENT NAME: Bradley Morrison    MR#:  [id number]  DATE OF BIRTH:  1976-01-14  DATE OF ADMISSION:  05/27/2023  PRIMARY CARE PHYSICIAN: [facility]   Patient is coming from: Home  REQUESTING/REFERRING PHYSICIAN: Pilar Jarvis, MD  CHIEF COMPLAINT:   Chief Complaint  Patient presents with   Code Stroke   Aphasia   Weakness    R   Dysphagia   Facial Droop    R    HISTORY OF PRESENT ILLNESS:  Bradley Morrison is a 47 y.o. 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  He had headache without dizziness or blurred vision.  No tinnitus or vertigo.  No urinary or stool incontinence.  No weakness seizures.  No chest pain or palpitations.  No fever or chills.  No nausea or vomiting or abdominal pain.  No dysuria, oliguria, urinary frequency or urgency or flank pain.  ED Course: Upon presentation to the emergency room, BP was 160/96 with temperature 97.5 with otherwise normal vital signs.  Labs revealed unremarkable CMP.  CBC showed leukocytosis 12.4 with neutrophilia.  UA was negative.  Urine drug screen was positive for cannabinoids. EKG as reviewed by me : EKG showed sinus rhythm with a rate of 86 with nonspecific interventricular conduction delay and inferior Q waves. Imaging: Code stroke CT revealed old no acute intracranial abnormalities.  CTA of the head and neck revealed normal CT perfusion with no emergent large vessel occlusion or high-grade stenosis of the intracranial arteries.  The patient was not considered a candidate for tPA per teleneurology consult due to recent CVA.  He will be admitted to an observation medical telemetry [facility] for evaluation and management. PAST MEDICAL HISTORY:   Past Medical History:   Diagnosis Date   Anxiety    CP (cerebral palsy) (HCC)    Hypertension    Stroke (HCC)     PAST SURGICAL HISTORY:  History reviewed. No pertinent surgical history. No previous surgeries. SOCIAL HISTORY:   Social History   Tobacco Use   Smoking status: Every Day    Current packs/day: 1.00    Types: Cigarettes   Smokeless tobacco: Never  Substance Use Topics   Alcohol use: No    FAMILY HISTORY:  History reviewed. No pertinent family history. He denies any familial diseases. DRUG ALLERGIES:   Allergies  Allergen Reactions   Lisinopril Swelling    Lip swelling    REVIEW OF SYSTEMS:   ROS As per history of present illness. All pertinent systems were reviewed above. Constitutional, HEENT, cardiovascular, respiratory, GI, GU, musculoskeletal, neuro, psychiatric, endocrine, integumentary and hematologic systems were reviewed and are otherwise negative/unremarkable except for positive findings mentioned above in the HPI.   MEDICATIONS AT HOME:   Prior to Admission medications   Medication Sig Start Date End Date Taking? Authorizing Provider  amLODipine (NORVASC) 5 MG tablet Take 1 tablet by mouth daily. 10/24/21 10/24/22  [provider]  atorvastatin (LIPITOR) 40 MG tablet Take 40 mg by mouth daily. 11/26/21   [provider]  citalopram (CELEXA) 40 MG tablet Take 40 mg by mouth daily. 11/26/21   [provider]  dicyclomine (BENTYL) 20 MG tablet Take 1 tablet (20 mg total) by mouth 3 (three) times daily as  needed for up to 7 days for spasms (muscle spasms). 10/27/22 11/03/22  Shaune Pollack, MD  FARXIGA 10 MG TABS tablet Take 10 mg by mouth daily. 11/26/21   [provider]  gabapentin (NEURONTIN) 300 MG capsule Take 300 mg by mouth 3 (three) times daily. 10/24/21   [provider]  loperamide (IMODIUM A-D) 2 MG tablet Take 1 tablet (2 mg total) by mouth 4 (four) times daily as needed for diarrhea or loose stools. 10/27/22   Shaune Pollack, MD   losartan-hydrochlorothiazide (HYZAAR) 50-12.5 MG tablet Take by mouth. 08/11/21   [provider]  metFORMIN (GLUCOPHAGE) 500 MG tablet Take 1,000 mg by mouth 2 (two) times daily. 11/26/21   [provider]  ondansetron (ZOFRAN-ODT) 4 MG disintegrating tablet Take 1 tablet (4 mg total) by mouth every 8 (eight) hours as needed. 10/27/22   Shaune Pollack, MD  predniSONE (STERAPRED UNI-PAK 21 TAB) 10 MG (21) TBPK tablet As directed 03/06/18   Lorre Munroe, NP      VITAL SIGNS:  Blood pressure (!) 140/91, pulse 79, temperature 97.9 F (36.6 C), temperature source Oral, resp. rate 16, height 5\' 6"  (1.676 m), weight 95.9 kg, SpO2 93%.  PHYSICAL EXAMINATION:  Physical Exam  GENERAL:  47 y.o.-year-old Caucasian male patient lying in the bed with no acute distress.  EYES: Pupils equal, round, reactive to light and accommodation. No scleral icterus. Extraocular muscles intact.  HEENT: Head atraumatic, normocephalic. Oropharynx and nasopharynx clear.  NECK:  Supple, no jugular venous distention. No thyroid enlargement, no tenderness.  LUNGS: Normal breath sounds bilaterally, no wheezing, rales,rhonchi or crepitation. No use of accessory muscles of respiration.  CARDIOVASCULAR: Regular rate and rhythm, S1, S2 normal. No murmurs, rubs, or gallops.  ABDOMEN: Soft, nondistended, nontender. Bowel sounds present. No organomegaly or mass.  EXTREMITIES: No pedal edema, cyanosis, or clubbing.  NEUROLOGIC: Cranial nerves II through XII are intact except for right facial droop and dysarthria. Muscle strength 5/5 in both right upper and lower extremities and 4/5 in both left upper and lower extremities. Sensation intact to light touch. Gait not checked.  PSYCHIATRIC: The patient is alert and oriented x 3.  Normal affect and good eye contact. SKIN: No obvious rash, lesion, or ulcer.   LABORATORY PANEL:   CBC Recent Labs  Lab 05/28/23 0007  WBC 12.4*  HGB 15.2  HCT 47.3  PLT 305    ------------------------------------------------------------------------------------------------------------------  Chemistries  Recent Labs  Lab 05/28/23 0007  NA 139  K 3.9  CL 106  CO2 24  GLUCOSE 138*  BUN 17  CREATININE 0.68  CALCIUM 9.1  AST 16  ALT 32  ALKPHOS 88  BILITOT 1.0   ------------------------------------------------------------------------------------------------------------------  Cardiac Enzymes No results for input(s): "TROPONINI" in the last 168 hours. ------------------------------------------------------------------------------------------------------------------  RADIOLOGY:  CT ANGIO HEAD NECK W WO CM W PERF (CODE STROKE)  Addendum Date: 05/28/2023   ADDENDUM REPORT: 05/28/2023 00:58 ADDENDUM: Right vertebral artery V4 segment not visualized. The right vertebral artery probably terminates in PICA. Electronically Signed   By: Deatra Robinson M.D.   On: 05/28/2023 00:58   Result Date: 05/28/2023 CLINICAL DATA:  Right facial weakness and left leg weakness. Dysarthria. EXAM: CT ANGIOGRAPHY HEAD AND NECK CT PERFUSION BRAIN TECHNIQUE: Multidetector CT imaging of the head and neck was performed using the standard protocol during bolus administration of intravenous contrast. Multiplanar CT image reconstructions and MIPs were obtained to evaluate the vascular anatomy. Carotid stenosis measurements (when applicable) are obtained utilizing NASCET criteria, using  the distal internal carotid diameter as the denominator. Multiphase CT imaging of the brain was performed following IV bolus contrast injection. Subsequent parametric perfusion maps were calculated using RAPID software. RADIATION DOSE REDUCTION: This exam was performed according to the departmental dose-optimization program which includes automated exposure control, adjustment of the mA and/or kV according to patient size and/or use of iterative reconstruction technique. CONTRAST:  OMNIPAQUE IOHEXOL 350 MG/ML  SOLN COMPARISON:  None Available. FINDINGS: CT Brain Perfusion Findings: ASPECTS: 10 CBF (<30%) Volume: 0mL Perfusion (Tmax>6.0s) volume: 0mL Mismatch Volume: 0mL Infarction Location:None CTA NECK FINDINGS SKELETON: There is no bony spinal canal stenosis. No lytic or blastic lesion. OTHER NECK: Normal pharynx, larynx and major salivary glands. No cervical lymphadenopathy. Unremarkable thyroid gland. UPPER CHEST: No pneumothorax or pleural effusion. No nodules or masses. AORTIC ARCH: There is no calcific atherosclerosis of the aortic arch. Conventional 3 vessel aortic branching pattern. RIGHT CAROTID SYSTEM: Normal without aneurysm, dissection or stenosis. LEFT CAROTID SYSTEM: Normal without aneurysm, dissection or stenosis. VERTEBRAL ARTERIES: Left dominant configuration.There is no dissection, occlusion or flow-limiting stenosis to the skull base (V1-V3 segments). CTA HEAD FINDINGS POSTERIOR CIRCULATION: --Vertebral arteries: Normal V4 segments. --Inferior cerebellar arteries: Normal. --Basilar artery: Normal. --Superior cerebellar arteries: Normal. --Posterior cerebral arteries (PCA): Normal. ANTERIOR CIRCULATION: --Intracranial internal carotid arteries: Normal. --Anterior cerebral arteries (ACA): Normal. Both A1 segments are present. Patent anterior communicating artery (a-comm). --Middle cerebral arteries (MCA): Normal. VENOUS SINUSES: As permitted by contrast timing, patent. ANATOMIC VARIANTS: None Review of the MIP images confirms the above findings. IMPRESSION: 1. No emergent large vessel occlusion or high-grade stenosis of the intracranial arteries. 2. Normal CT perfusion. Electronically Signed: By: Deatra Robinson M.D. On: 05/28/2023 00:55   CT HEAD CODE STROKE WO CONTRAST  Result Date: 05/28/2023 CLINICAL DATA:  Code stroke.  Aphasia EXAM: CT HEAD WITHOUT CONTRAST TECHNIQUE: Contiguous axial images were obtained from the base of the skull through the vertex without intravenous contrast. RADIATION DOSE  REDUCTION: This exam was performed according to the departmental dose-optimization program which includes automated exposure control, adjustment of the mA and/or kV according to patient size and/or use of iterative reconstruction technique. COMPARISON:  None Available. FINDINGS: Brain: There is no mass, hemorrhage or extra-axial collection. The size and configuration of the ventricles and extra-axial CSF spaces are normal. The brain parenchyma is normal, without evidence of acute or chronic infarction. Vascular: No abnormal hyperdensity of the major intracranial arteries or dural venous sinuses. No intracranial atherosclerosis. Skull: Normal calvarium.  Left anterior scalp trichilemmal cyst. Sinuses/Orbits: No fluid levels or advanced mucosal thickening of the visualized paranasal sinuses. No mastoid or middle ear effusion. The orbits are normal. ASPECTS [facility] Stroke Program Early CT Score) - Ganglionic level infarction (caudate, lentiform nuclei, internal capsule, insula, M1-M3 cortex): 7 - Supraganglionic infarction (M4-M6 cortex): 3 Total score (0-10 with 10 being normal): 10 IMPRESSION: 1. No acute intracranial abnormality. 2. ASPECTS is 10. These results were called by telephone at the time of interpretation on 05/28/2023 at 12:02 am to provider [facility] , who verbally acknowledged these results. Electronically Signed   By: Deatra Robinson M.D.   On: 05/28/2023 00:03      IMPRESSION AND PLAN:  Assessment and Plan: * Acute ischemic stroke [facility]) - The patient will be admitted to an observation medically monitored bed. - This is manifested by worsening dysarthria as well as right facial droop.  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 - We will  follow neuro checks q.4 hours for 24 hours.   - The patient will be switched from aspirin to Plavix per teleneurology recommendation. - Will obtain a brain MRI without contrast as well as  2D echo with  bubble study .   - A neurology consultation  as well as physical/occupation/speech therapy consults will be obtained in a.m.Marland Kitchen - I notified Dr. Wilford Corner about the patient. - The patient will be placed on statin therapy and fasting lipids will be checked.    Dyslipidemia - We will continue statin therapy.  Type 2 diabetes mellitus with peripheral neuropathy (HCC) - The patient will be placed on supplemental coverage with NovoLog. - Will continue [place]. - Will continue Neurontin. - Will hold off metformin.  Essential hypertension - Will continue antihypertensives with permissive parameters.  Marijuana abuse - She was counseled for cessation.   DVT prophylaxis: Lovenox. Advanced Care Planning:  Code Status: full code. Family Communication:  The plan of care was discussed in details with the patient (and family). I answered all questions. The patient agreed to proceed with the above mentioned plan. Further management will depend upon hospital course. Disposition Plan: Back to previous home environment Consults called: Neurology. All the records are reviewed and case discussed with ED provider.  Status is: Observation .  I certify that at the time of admission, it is my clinical judgment that the patient will require  hospital care extending less than 2 midnights.                            Dispo: The patient is from: Home              Anticipated d/c is to: Home              Patient currently is not medically stable to d/c.              Difficult to place patient: No  Hannah Beat M.D on 05/28/2023 at 3:19 AM  [facility]   From 7 PM-7 AM, contact night-coverage [email or web address]  CC: Primary care physician; [employer]

## 2023-05-28 NOTE — Assessment & Plan Note (Signed)
-   Will continue antihypertensives with permissive parameters.

## 2023-05-28 NOTE — Evaluation (Addendum)
Physical Therapy Evaluation Patient Details Name: Bradley Morrison MRN: [id number] DOB: 13-Sep-1976 Today's Date: 05/28/2023  History of Present Illness  Pt is a 47y/o male presenting with acute ischemic stroke and inital complaints of aphasia, dysphagia, dysarthria, and R facial droop. PMH includes cerebral palsy, HTN, tobacco use, HLD, T2D, and anxiety. He recently had a R pontine stroke at [facility] last week resulting in L sided weakness and speech deficits.  Clinical Impression   Pt presents sitting EOB with OT and mother in room, no complaints of pain. He currently lives with his mom in a 1 story home with 5 stairs to enter and a rail on the L side. Until 1 week ago, he was independent with mobility and ADLs.   Cotreatment with OT performed to maximize patient and therapist safety. PT found light touch sensation (WNL), gross LE strength (L hip flexors 3/5, L knee ext 3+/5, all others 4-5/5) and + clonus in L foot. Pt able to perform sit<>stand with min-CGA. Pt able to walk ~151ft with minA and RW, PT noting decreased gait speed and some circumduction of L hip for foot clearance. When RW was removed, pt required modA and exhibited a prominent B/L lateral lean and increased difficulty with foot clearance. Throughout ambulation, pt balance and stability was limited due to LLE clonus. Throughout visit, pt was pleasant and motivated to participate. Pt would benefit from skilled therapy to address deficits in balance/mobility and maximize functional abilities.       If plan is discharge home, recommend the following: A lot of help with walking and/or transfers;A little help with bathing/dressing/bathroom;Assistance with cooking/housework;Direct supervision/assist for financial management;Assist for transportation;Help with stairs or ramp for entrance;Direct supervision/assist for medications management   Can travel by private vehicle        Equipment Recommendations None recommended by PT (TBD at next  venue of care)  Recommendations for Other Services       Functional Status Assessment Patient has had a recent decline in their functional status and demonstrates the ability to make significant improvements in function in a reasonable and predictable amount of time.     Precautions / Restrictions Precautions Precautions: Fall Restrictions Weight Bearing Restrictions: No      Mobility  Bed Mobility               General bed mobility comments: Pt seated EOB upon entry    Transfers Overall transfer level: Needs assistance Equipment used: Rolling walker (2 wheels) Transfers: Sit to/from Stand Sit to Stand: Min assist, Contact guard assist                Ambulation/Gait Ambulation/Gait assistance: Mod assist, Min assist Gait Distance (Feet): 140 Feet Assistive device: Rolling walker (2 wheels), None   Gait velocity: decreased     General Gait Details: Increased lateral lean b/l and L hip circumduction to asssit with L foot clearance when RW removed, balance while ambulated limited by L foot clonus/shaking  Stairs            Wheelchair Mobility     Tilt Bed    Modified Rankin (Stroke Patients Only)       Balance Overall balance assessment: Needs assistance Sitting-balance support: Feet supported, No upper extremity supported Sitting balance-Leahy Scale: Good     Standing balance support: Single extremity supported, During functional activity Standing balance-Leahy Scale: Fair  Pertinent Vitals/Pain Pain Assessment Pain Assessment: No/denies pain    Home Living Family/patient expects to be discharged to:: Private residence Living Arrangements: Parent Available Help at Discharge: Family;Available 24 hours/day Type of Home: House Home Access: Stairs to enter Entrance Stairs-Rails: Left Entrance Stairs-Number of Steps: 5   Home Layout: One level Home Equipment: Agricultural consultant (2 wheels)       Prior Function Prior Level of Function : Independent/Modified Independent             Mobility Comments: 2 weeks ago, pt was IND with ambulation       Extremity/Trunk Assessment   Upper Extremity Assessment Upper Extremity Assessment: Overall WFL for tasks assessed    Lower Extremity Assessment Lower Extremity Assessment: LLE deficits/detail;RLE deficits/detail RLE Deficits / Details: MMT gross 4-5/5 RLE Sensation: WNL LLE Deficits / Details: L hip flexion 3/5, L knee ext 3+/5 LLE Sensation: WNL LLE Coordination: decreased gross motor (Unable to maintain alternate heel tapping with increased speed)       Communication   Communication Communication: Difficulty communicating thoughts/reduced clarity of speech  Cognition Arousal: Alert Behavior During Therapy: WFL for tasks assessed/performed Overall Cognitive Status: Within Functional Limits for tasks assessed                                          General Comments General comments (skin integrity, edema, etc.): + clonus sign L foot, modified ashworth +1 with knee flexion    Exercises     Assessment/Plan    PT Assessment Patient needs continued PT services  PT Problem List Decreased strength;Decreased range of motion;Decreased activity tolerance;Decreased balance;Decreased mobility;Decreased safety awareness;Decreased knowledge of use of DME;Decreased coordination       PT Treatment Interventions DME instruction;Balance training;Gait training;Neuromuscular re-education;Stair training;Functional mobility training;Therapeutic activities;Therapeutic exercise;Patient/family education    PT Goals (Current goals can be found in the Care Plan section)  Acute Rehab PT Goals Patient Stated Goal: return home PT Goal Formulation: With patient Time For Goal Achievement: 06/11/23 Potential to Achieve Goals: Good    Frequency Min 1X/week     Co-evaluation PT/OT/SLP Co-Evaluation/Treatment:  Yes Reason for Co-Treatment: For patient/therapist safety;To address functional/ADL transfers PT goals addressed during session: Mobility/safety with mobility;Proper use of DME OT goals addressed during session: ADL's and self-care       AM-PAC PT "6 Clicks" Mobility  Outcome Measure Help needed turning from your back to your side while in a flat bed without using bedrails?: A Little Help needed moving from lying on your back to sitting on the side of a flat bed without using bedrails?: A Little Help needed moving to and from a bed to a chair (including a wheelchair)?: A Lot Help needed standing up from a chair using your arms (e.g., wheelchair or bedside chair)?: A Little Help needed to walk in hospital room?: A Lot Help needed climbing 3-5 steps with a railing? : A Lot 6 Click Score: 15    End of Session Equipment Utilized During Treatment: Gait belt Activity Tolerance: Patient tolerated treatment well Patient left: in chair;with call bell/phone within reach;with family/visitor present   PT Visit Diagnosis: Other symptoms and signs involving the nervous system (R29.898);Other abnormalities of gait and mobility (R26.89);Difficulty in walking, not elsewhere classified (R26.2)    Time: 5852-7782 PT Time Calculation (min) (ACUTE ONLY): 36 min   Charges:     PT Treatments $Therapeutic Activity: 8-22  mins PT General Charges $$ ACUTE PT VISIT: 1 Visit        Elienai Gailey, PT, SPT 3:46 PM,05/28/23

## 2023-05-28 NOTE — Progress Notes (Signed)
PROGRESS NOTE    Bradley Morrison  ONG:[id number] DOB: 1976-10-03 DOA: 05/27/2023 PCP: [facility]   Assessment & Plan:   Principal Problem:   Acute ischemic stroke [facility]) Active Problems:   Dyslipidemia   Type 2 diabetes mellitus with peripheral neuropathy (HCC)   Essential hypertension   Marijuana abuse  Assessment and Plan: Acute ischemic stroke: continue on aspirin, plavix, statin as per neuro. Continue w/ neuro recs. Echo ordered. 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   HLD: continue on statin    DM2: likely poorly controlled. Will start SSI w/ accuchecks    HTN: continue on amlodipine, losartan    Marijuana abuse: received marijuana cessation counseling already     DVT prophylaxis: lovenox  Code Status: full  Family Communication: discussed pt's care w/ pt's family at bedside and answered their questions  Disposition Plan: possibly d/c to inpatient rehab   Level of care: Telemetry Medical  Status is: Inpatient Remains inpatient appropriate because: severity of illness     Consultants:  Neuro   Procedures:   Antimicrobials:   Subjective: Pt c/o weakness  Objective: Vitals:   05/28/23 0630 05/28/23 0700 05/28/23 0717 05/28/23 0827  BP: (!) 150/98 (!) 157/99 (!) 164/96 (!) 153/99  Pulse: 75 69 74 73  Resp: 14 14 18 17   Temp:   98.3 F (36.8 C) 98.8 F (37.1 C)  TempSrc:   Oral   SpO2:   92% 96%  Weight:      Height:       No intake or output data in the 24 hours ending 05/28/23 0857 Filed Weights   05/28/23 0009  Weight: 95.9 kg    Examination:  General exam: Appears calm and comfortable. Dysarthria  Respiratory system: Clear to auscultation. Respiratory effort normal. Cardiovascular system: S1 & S2+. No rubs, gallops or clicks. Gastrointestinal system: Abdomen is nondistended, soft and nontender.  Normal bowel  sounds heard. Central nervous system: Alert and awake. Psychiatry: Judgement and insight appear normal. Flat mood and affect    Data Reviewed: I have personally reviewed following labs and imaging studies  CBC: Recent Labs  Lab 05/28/23 0007  WBC 12.4*  NEUTROABS 7.8*  HGB 15.2  HCT 47.3  MCV 87.4  PLT 305   Basic Metabolic Panel: Recent Labs  Lab 05/28/23 0007  NA 139  K 3.9  CL 106  CO2 24  GLUCOSE 138*  BUN 17  CREATININE 0.68  CALCIUM 9.1   GFR: Estimated Creatinine Clearance: 121 mL/min (by C-G formula based on SCr of 0.68 mg/dL). Liver Function Tests: Recent Labs  Lab 05/28/23 0007  AST 16  ALT 32  ALKPHOS 88  BILITOT 1.0  PROT 8.4*  ALBUMIN 4.2   No results for input(s): "LIPASE", "AMYLASE" in the last 168 hours. No results for input(s): "AMMONIA" in the last 168 hours. Coagulation Profile: Recent Labs  Lab 05/28/23 0007  INR 1.0   Cardiac Enzymes: No results for input(s): "CKTOTAL", "CKMB", "CKMBINDEX", "TROPONINI" in the last 168 hours. BNP (last 3 results) No results for input(s): "PROBNP" in the last 8760 hours. HbA1C: No results for input(s): "HGBA1C" in the last 72 hours. CBG: Recent Labs  Lab 05/28/23 0002  GLUCAP 132*   Lipid Profile: No results for input(s): "CHOL", "HDL", "LDLCALC", "TRIG", "CHOLHDL", "LDLDIRECT" in the last 72 hours. Thyroid Function Tests: No results for input(s): "TSH", "T4TOTAL", "FREET4", "T3FREE", "THYROIDAB" in  the last 72 hours. Anemia Panel: No results for input(s): "VITAMINB12", "FOLATE", "FERRITIN", "TIBC", "IRON", "RETICCTPCT" in the last 72 hours. Sepsis Labs: No results for input(s): "PROCALCITON", "LATICACIDVEN" in the last 168 hours.  No results found for this or any previous visit (from the past 240 hour(s)).       Radiology Studies: MR BRAIN WO CONTRAST  Result Date: 05/28/2023 CLINICAL DATA:  Acute neurologic deficit EXAM: MRI HEAD WITHOUT CONTRAST TECHNIQUE: Multiplanar, multiecho  pulse sequences of the brain and surrounding structures were obtained without intravenous contrast. COMPARISON:  Head CT 05/27/2023 FINDINGS: Brain: Multifocal acute ischemia within the ventral pons. No acute or chronic hemorrhage. Normal white matter signal, parenchymal volume and CSF spaces. The midline structures are normal. Vascular: Major flow voids are preserved. Skull and upper cervical spine: Normal calvarium and skull base. Multiple scalp soft tissue lesions likely inclusion cysts. Sinuses/Orbits:No paranasal sinus fluid levels or advanced mucosal thickening. No mastoid or middle ear effusion. Normal orbits. IMPRESSION: Multifocal acute ischemia within the ventral pons. No hemorrhage or mass effect. Electronically Signed   By: Deatra Robinson M.D.   On: 05/28/2023 03:19   CT ANGIO HEAD NECK W WO CM W PERF (CODE STROKE)  Addendum Date: 05/28/2023   ADDENDUM REPORT: 05/28/2023 00:58 ADDENDUM: Right vertebral artery V4 segment not visualized. The right vertebral artery probably terminates in PICA. Electronically Signed   By: Deatra Robinson M.D.   On: 05/28/2023 00:58   Result Date: 05/28/2023 CLINICAL DATA:  Right facial weakness and left leg weakness. Dysarthria. EXAM: CT ANGIOGRAPHY HEAD AND NECK CT PERFUSION BRAIN TECHNIQUE: Multidetector CT imaging of the head and neck was performed using the standard protocol during bolus administration of intravenous contrast. Multiplanar CT image reconstructions and MIPs were obtained to evaluate the vascular anatomy. Carotid stenosis measurements (when applicable) are obtained utilizing NASCET criteria, using the distal internal carotid diameter as the denominator. Multiphase CT imaging of the brain was performed following IV bolus contrast injection. Subsequent parametric perfusion maps were calculated using RAPID software. RADIATION DOSE REDUCTION: This exam was performed according to the departmental dose-optimization program which includes automated exposure  control, adjustment of the mA and/or kV according to patient size and/or use of iterative reconstruction technique. CONTRAST:  OMNIPAQUE IOHEXOL 350 MG/ML SOLN COMPARISON:  None Available. FINDINGS: CT Brain Perfusion Findings: ASPECTS: 10 CBF (<30%) Volume: 0mL Perfusion (Tmax>6.0s) volume: 0mL Mismatch Volume: 0mL Infarction Location:None CTA NECK FINDINGS SKELETON: There is no bony spinal canal stenosis. No lytic or blastic lesion. OTHER NECK: Normal pharynx, larynx and major salivary glands. No cervical lymphadenopathy. Unremarkable thyroid gland. UPPER CHEST: No pneumothorax or pleural effusion. No nodules or masses. AORTIC ARCH: There is no calcific atherosclerosis of the aortic arch. Conventional 3 vessel aortic branching pattern. RIGHT CAROTID SYSTEM: Normal without aneurysm, dissection or stenosis. LEFT CAROTID SYSTEM: Normal without aneurysm, dissection or stenosis. VERTEBRAL ARTERIES: Left dominant configuration.There is no dissection, occlusion or flow-limiting stenosis to the skull base (V1-V3 segments). CTA HEAD FINDINGS POSTERIOR CIRCULATION: --Vertebral arteries: Normal V4 segments. --Inferior cerebellar arteries: Normal. --Basilar artery: Normal. --Superior cerebellar arteries: Normal. --Posterior cerebral arteries (PCA): Normal. ANTERIOR CIRCULATION: --Intracranial internal carotid arteries: Normal. --Anterior cerebral arteries (ACA): Normal. Both A1 segments are present. Patent anterior communicating artery (a-comm). --Middle cerebral arteries (MCA): Normal. VENOUS SINUSES: As permitted by contrast timing, patent. ANATOMIC VARIANTS: None Review of the MIP images confirms the above findings. IMPRESSION: 1. No emergent large vessel occlusion or high-grade stenosis of the intracranial arteries. 2. Normal CT perfusion.  Electronically Signed: By: Deatra Robinson M.D. On: 05/28/2023 00:55   CT HEAD CODE STROKE WO CONTRAST  Result Date: 05/28/2023 CLINICAL DATA:  Code stroke.  Aphasia EXAM: CT  HEAD WITHOUT CONTRAST TECHNIQUE: Contiguous axial images were obtained from the base of the skull through the vertex without intravenous contrast. RADIATION DOSE REDUCTION: This exam was performed according to the departmental dose-optimization program which includes automated exposure control, adjustment of the mA and/or kV according to patient size and/or use of iterative reconstruction technique. COMPARISON:  None Available. FINDINGS: Brain: There is no mass, hemorrhage or extra-axial collection. The size and configuration of the ventricles and extra-axial CSF spaces are normal. The brain parenchyma is normal, without evidence of acute or chronic infarction. Vascular: No abnormal hyperdensity of the major intracranial arteries or dural venous sinuses. No intracranial atherosclerosis. Skull: Normal calvarium.  Left anterior scalp trichilemmal cyst. Sinuses/Orbits: No fluid levels or advanced mucosal thickening of the visualized paranasal sinuses. No mastoid or middle ear effusion. The orbits are normal. ASPECTS [facility] Stroke Program Early CT Score) - Ganglionic level infarction (caudate, lentiform nuclei, internal capsule, insula, M1-M3 cortex): 7 - Supraganglionic infarction (M4-M6 cortex): 3 Total score (0-10 with 10 being normal): 10 IMPRESSION: 1. No acute intracranial abnormality. 2. ASPECTS is 10. These results were called by telephone at the time of interpretation on 05/28/2023 at 12:02 am to provider [facility] , who verbally acknowledged these results. Electronically Signed   By: Deatra Robinson M.D.   On: 05/28/2023 00:03        Scheduled Meds:  amLODipine  10 mg Oral Daily   atorvastatin  80 mg Oral Daily   clopidogrel  75 mg Oral Daily   enoxaparin (LOVENOX) injection  50 mg Subcutaneous Q24H   losartan  25 mg Oral Daily   Continuous Infusions:  sodium chloride 100 mL/hr at 05/28/23 0305     LOS: 0 days    Time spent: 35 mins     [name], MD [facility] Pager  [phone]  If 7PM-7AM, please contact night-coverage [email or web address] 05/28/2023, 8:57 AM

## 2023-05-28 NOTE — Assessment & Plan Note (Addendum)
-   We will continue statin therapy. 

## 2023-05-28 NOTE — Assessment & Plan Note (Addendum)
-   The patient will be admitted to an observation medically monitored bed. - This is manifested by worsening dysarthria as well as right facial droop.  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 - We will follow neuro checks q.4 hours for 24 hours.   - The patient will be switched from aspirin to Plavix per teleneurology recommendation. - Will obtain a brain MRI without contrast as well as  2D echo with bubble study .   - A neurology consultation  as well as physical/occupation/speech therapy consults will be obtained in a.m.Marland Kitchen - I notified Dr. Wilford Corner about the patient. - The patient will be placed on statin therapy and fasting lipids will be checked.

## 2023-05-28 NOTE — Assessment & Plan Note (Signed)
-   The patient will be placed on supplemental coverage with NovoLog. - Will continue [place]. - Will continue Neurontin. - Will hold off metformin.

## 2023-05-28 NOTE — ED Triage Notes (Signed)
Pt presents to the ED via POV c/o stroke like symptoms. Pt does have a history of stroke last week. Pt presents with aphasia, R sided weakness, R sided facial droop and dysphasia.

## 2023-05-28 NOTE — ED Notes (Signed)
RN placed orders for Code Stroke at 2351. Notified Consulting civil engineer of activation and immediately moved pt to CT and notified [facility] of stroke notification at 2351. Notified of pt name, last known normal, and deficit. RN then continued to CT with patient.

## 2023-05-28 NOTE — Consult Note (Addendum)
[facility] TeleNeurology Consult Services   Patient Name:   Bradley Morrison, Bradley Morrison Date of Birth:   [birth year] Identification Number:   MRN - [id number] Date of Service:   05/28/2023 00:02:52  Diagnosis:       I63.30 - Cerebrovascular accident (CVA) due to thrombosis of cerebral artery [facility])  Impression:      47 year old male with history of hypertension, hyperlipidemia, diabetes mellitus, cerebral palsy (mainly with some foot problems/deformity), recent right pontine stroke in the past week, cared for at [facility] with some residual left-sided weakness and minor speech disturbance. Apparently this evening around 8 PM, he had worsened speech, mainly increased slurred speech. He also has right facial weakness. Unclear if his left side is weaker than previously. Apparently symptoms resolved, he went to bed at 9 PM and woke up later with recurrent similar symptoms with the right facial weakness, altered speech and difficulty swallowing.  On examination, patient has moderate to severe dysarthria which is definitely worse than previously according to wife as well as new right facial weakness. He does have significant left leg weakness, unclear if this is worse than previously. He also has diminished in station of the left limbs. NIHSS = 7. CT head reveals no definite acute findings.We will proceed with stat CTA of head and neck,CT perfusion and involve NIR if there is LVO.  Suspect possible new stroke versus recrudescence/worsening of prior stroke deficits although according to wife right facial weakness is new and slurred speech is much worse. Admit for cerebrovascular workup and management.    Our recommendations are outlined below.  Recommendations:        Stroke/Telemetry Floor       Neuro Checks       Bedside Swallow Eval       DVT Prophylaxis       IV Fluids, Normal Saline       Head of Bed 30 Degrees       Euglycemia and Avoid Hyperthermia (PRN Acetaminophen)       Initiate  Clopidogrel 75 mg daily       Antihypertensives PRN if Blood pressure is greater than 220/120 or there is a concern for End organ damage/contraindications for permissive HTN. If blood pressure is greater than 220/120 give labetalol PO or IV or Vasotec IV with a goal of 15% reduction in BP during the first 24 hours.       If patient passes his swallow screen evaluation, discontinue aspirin and start Plavix 75 mg daily.       If he does not pass his swallow evaluation, instead start rectal aspirin 300 mg daily.       Stroke risk factor modification.       Stroke education.       Suggest MRI brain. Retrieve results from recent echo from [facility] and his prior records.  Sign Out:       Discussed with Emergency Department Provider    ------------------------------------------------------------------------------  Metrics: Last Known Well: 05/27/2023 21:00:00 [facility] Notification Time: 05/28/2023 00:02:52 Arrival Time: 05/27/2023 23:46:00 Stamp Time: 05/28/2023 00:02:52 Initial Response Time: 05/28/2023 00:06:12 Symptoms: Altered speech, right facial weakness, possible worsened left sided weakness.. Initial patient interaction: 05/28/2023 00:11:51 NIHSS Assessment Completed: 05/28/2023 00:18:00 Patient is not a candidate for Thrombolytic. Thrombolytic Medical Decision: 05/28/2023 00:18:09 Patient was not deemed candidate for Thrombolytic because of following reasons: Stroke in last 3 months.  CT head showed no acute hemorrhage or acute core infarct. I personally Reviewed the CT Head and it Showed No bleed  or acute findings.  Primary Provider Notified of Diagnostic Impression and Management Plan on: 05/28/2023 00:41:28    ------------------------------------------------------------------------------  History of Present Illness: Patient is a 47 year old Male.  Patient was brought by EMS for symptoms of Altered speech, right facial weakness, possible worsened left sided  weakness.. 47 year old male with history of hypertension, hyperlipidemia, diabetes mellitus, cerebral palsy (mainly with some foot problems/deformity), recent Right pontine stroke in the past week, cared for at [facility] with some residual left-sided weakness and minor speech disturbance. He was recently discharged home. Apparently this evening around 8 PM, he had worsened speech, mainly increased slurred speech. He also has right facial weakness. Unclear if his left side is weaker than previously. Apparently symptoms resolved, he went to bed at 9 PM and woke up later with recurrent similar symptoms with the right facial weakness, altered speech and difficulty swallowing.    Past Medical History:      Hypertension      Diabetes Mellitus      Hyperlipidemia      Stroke      Covid-19      Migraine Headaches      There is no history of Atrial Fibrillation      There is no history of Coronary Artery Disease      There is no history of Seizures      There is no history of Dementia/MCI Othere PMH:  Cerebral palsy with foot problems , Pes cavus deformity, depression, history of sciatica  Medications:  No Anticoagulant use  Antiplatelet use: Yes ASA Reviewed EMR for current medications Other Medications Pertinent To Assessment Include: Amlodipine, atorvastatin , Losartan ,  Allergies:  Reviewed Description: Lisinopril  Social History: Patient Is: Married Current Employee : Quit smoking after CVA   Smoking: Former Alcohol Use: No Drug Use: No  Family History:  There is no family history of premature cerebrovascular disease pertinent to this consultation  ROS : 14 Points Review of Systems was performed and was negative except mentioned in HPI.  Past Surgical History: There Is No Surgical History Contributory To Today's Visit There Is Surgical History of:  Foot surgeries     Examination: BP(163/85), Pulse(82), Blood Glucose(142) 1A: Level of Consciousness - Alert; keenly responsive  + 0 1B: Ask Month and Age - Both Questions Right + 0 1C: Blink Eyes & Squeeze Hands - Performs Both Tasks + 0 2: Test Horizontal Extraocular Movements - Normal + 0 3: Test Visual Fields - No Visual Loss + 0 4: Test Facial Palsy (Use Grimace if Obtunded) - Partial paralysis (lower face) + 2 5A: Test Left Arm Motor Drift - No Drift for 10 Seconds + 0 5B: Test Right Arm Motor Drift - No Drift for 10 Seconds + 0 6A: Test Left Leg Motor Drift - Drift, but doesn't hit bed + 1 6B: Test Right Leg Motor Drift - No Drift for 5 Seconds + 0 7: Test Limb Ataxia (FNF/Heel-Shin) - No Ataxia + 0 8: Test Sensation - Mild-Moderate Loss: Less Sharp/More Dull + 1 9: Test Language/Aphasia - Mild-Moderate Aphasia: Some Obvious Changes, Without Significant Limitation + 1 10: Test Dysarthria - Severe Dysarthria: Unintelligble Slurring or Out of Proportion to Aphasia + 2 11: Test Extinction/Inattention - No abnormality + 0  NIHSS Score: 7   Pre-Morbid Modified Rankin Scale: 3 Points = Moderate disability; requiring some help, but able to walk without assistance  Spoke with : Dr. Modesto Charon  This consult was conducted in real time using  interactive audio and Immunologist. Patient was informed of the technology being used for this visit and agreed to proceed. Patient located in hospital and provider located at home/office setting.   Patient is being evaluated for possible acute neurologic impairment and high probability of imminent or life-threatening deterioration. I spent total of 47 minutes providing care to this patient, including time for face to face visit via telemedicine, review of medical records, imaging studies and discussion of findings with providers, the patient and/or family.   Dr Braulio Bosch   [facility] For Inpatient follow-up with [facility] physician please call [facility] [phone]. This is not an outpatient service. Post hospital discharge, please contact hospital  directly.  Please do not communicate with [facility] physicians via secure chat. If you have any questions, Please contact [facility]. Please call or reconsult our service if there are any clinical or diagnostic changes.  Addendum:  CTA head neck negative for large vessel occlusion or significant stenosis. CT perfusion negative.

## 2023-05-28 NOTE — Assessment & Plan Note (Signed)
-  She was counseled for cessation  °

## 2023-05-29 DIAGNOSIS — I639 Cerebral infarction, unspecified: Secondary | ICD-10-CM | POA: Diagnosis not present

## 2023-05-29 LAB — GLUCOSE, CAPILLARY
Glucose-Capillary: 106 mg/dL — ABNORMAL HIGH (ref 70–99)
Glucose-Capillary: 154 mg/dL — ABNORMAL HIGH (ref 70–99)
Glucose-Capillary: 76 mg/dL (ref 70–99)
Glucose-Capillary: 187 mg/dL — ABNORMAL HIGH (ref 70–99)

## 2023-05-29 LAB — ANTITHROMBIN III: AntiThromb III Func: 103 % (ref 75–120)

## 2023-05-29 MED ORDER — ASPIRIN 81 MG PO TBEC
81.0000 mg | DELAYED_RELEASE_TABLET | Freq: Every day | ORAL | Status: DC
  Administered 2023-05-29 – 2023-06-01 (×3): 81 mg via ORAL
  Filled 2023-05-29 (×3): qty 1

## 2023-05-29 NOTE — Plan of Care (Signed)
  Problem: Education: Goal: Knowledge of disease or condition will improve Outcome: Progressing Goal: Knowledge of secondary prevention will improve (MUST DOCUMENT ALL) Outcome: Progressing Goal: Knowledge of patient specific risk factors will improve (Mark N/A or DELETE if not current risk factor) Outcome: Progressing   Problem: Ischemic Stroke/TIA Tissue Perfusion: Goal: Complications of ischemic stroke/TIA will be minimized Outcome: Progressing   Problem: Coping: Goal: Will verbalize positive feelings about self Outcome: Progressing Goal: Will identify appropriate support needs Outcome: Progressing   Problem: Health Behavior/Discharge Planning: Goal: Ability to manage health-related needs will improve Outcome: Progressing Goal: Goals will be collaboratively established with patient/family Outcome: Progressing   Problem: Self-Care: Goal: Ability to participate in self-care as condition permits will improve Outcome: Progressing Goal: Verbalization of feelings and concerns over difficulty with self-care will improve Outcome: Progressing Goal: Ability to communicate needs accurately will improve Outcome: Progressing   Problem: Nutrition: Goal: Risk of aspiration will decrease Outcome: Progressing Goal: Dietary intake will improve Outcome: Progressing   Problem: Education: Goal: Knowledge of General Education information will improve Description: Including pain rating scale, medication(s)/side effects and non-pharmacologic comfort measures Outcome: Progressing   Problem: Health Behavior/Discharge Planning: Goal: Ability to manage health-related needs will improve Outcome: Progressing   Problem: Clinical Measurements: Goal: Ability to maintain clinical measurements within normal limits will improve Outcome: Progressing Goal: Will remain free from infection Outcome: Progressing Goal: Diagnostic test results will improve Outcome: Progressing Goal: Respiratory  complications will improve Outcome: Progressing Goal: Cardiovascular complication will be avoided Outcome: Progressing   Problem: Activity: Goal: Risk for activity intolerance will decrease Outcome: Progressing   Problem: Nutrition: Goal: Adequate nutrition will be maintained Outcome: Progressing   Problem: Coping: Goal: Level of anxiety will decrease Outcome: Progressing   Problem: Elimination: Goal: Will not experience complications related to bowel motility Outcome: Progressing Goal: Will not experience complications related to urinary retention Outcome: Progressing   Problem: Pain Managment: Goal: General experience of comfort will improve Outcome: Progressing   Problem: Safety: Goal: Ability to remain free from injury will improve Outcome: Progressing   Problem: Skin Integrity: Goal: Risk for impaired skin integrity will decrease Outcome: Progressing   Problem: Education: Goal: Ability to describe self-care measures that may prevent or decrease complications (Diabetes Survival Skills Education) will improve Outcome: Progressing Goal: Individualized Educational Video(s) Outcome: Progressing   Problem: Coping: Goal: Ability to adjust to condition or change in health will improve Outcome: Progressing   Problem: Fluid Volume: Goal: Ability to maintain a balanced intake and output will improve Outcome: Progressing   Problem: Health Behavior/Discharge Planning: Goal: Ability to identify and utilize available resources and services will improve Outcome: Progressing Goal: Ability to manage health-related needs will improve Outcome: Progressing   Problem: Metabolic: Goal: Ability to maintain appropriate glucose levels will improve Outcome: Progressing   Problem: Nutritional: Goal: Maintenance of adequate nutrition will improve Outcome: Progressing Goal: Progress toward achieving an optimal weight will improve Outcome: Progressing   Problem: Skin  Integrity: Goal: Risk for impaired skin integrity will decrease Outcome: Progressing   Problem: Tissue Perfusion: Goal: Adequacy of tissue perfusion will improve Outcome: Progressing   

## 2023-05-29 NOTE — Progress Notes (Addendum)
Inpatient Rehab Admissions Coordinator:  Consult received. Attempted to contact pt via telephone. No one answered. Will continue to follow.  1347: Attempted to contact pt again. No one answered.  Wolfgang Phoenix, MS, CCC-SLP Admissions Coordinator [phone]

## 2023-05-29 NOTE — Progress Notes (Signed)
PROGRESS NOTE    Bradley Morrison  JXB:[id number] DOB: 08-24-1976 DOA: 05/27/2023 PCP: [facility]   Assessment & Plan:   Principal Problem:   Acute ischemic stroke [facility]) Active Problems:   Dyslipidemia   Type 2 diabetes mellitus with peripheral neuropathy (HCC)   Essential hypertension   Marijuana abuse   CVA (cerebral vascular accident) (HCC)  Assessment and Plan: Acute ischemic stroke: continue on aspirin, plavix, & statin as per neuro. Continue w/ neuro recs. Echo shows EF 55-60%, grade I diastolic dysfunction & no intraatrial shunts identified. 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  Dysphagia: likely secondary to acute CVA. Continue on dysphagia II diet as per speech   HLD: continue on statin    DM2: well controlled, HbA1c 6.5. Continue on SSI w/ accuchecks    HTN: continue on losartan, amlodipine   Marijuana abuse: received marijuana cessation counseling already     DVT prophylaxis: lovenox  Code Status: full  Family Communication: discussed pt's care w/ pt's family at bedside and answered their questions  Disposition Plan: possibly d/c to inpatient rehab   Level of care: Telemetry Medical  Status is: Inpatient Remains inpatient appropriate because: severity of illness     Consultants:  Neuro   Procedures:   Antimicrobials:   Subjective: Pt c/o weakness  Objective: Vitals:   05/28/23 2047 05/29/23 0037 05/29/23 0354 05/29/23 0746  BP: (!) 139/95 (!) 132/93 124/84 (!) 127/90  Pulse: 74 76 76 73  Resp: 18 16 18 17   Temp: 98.2 F (36.8 C) 97.7 F (36.5 C) 98.3 F (36.8 C) 99.2 F (37.3 C)  TempSrc: Oral Oral Oral   SpO2: 98% 96% 94% 95%  Weight:      Height:        Intake/Output Summary (Last 24 hours) at 05/29/2023 0815 Last data filed at 05/28/2023 0900 Gross per 24 hour  Intake --  Output 100 ml  Net -100 ml    Filed Weights   05/28/23 0009  Weight: 95.9 kg    Examination:  General exam: Appears upset. Dysarthria  Respiratory system: clear breath sounds b/l Cardiovascular system: S1/S2+. No rubs or clicks Gastrointestinal system: Abd is soft, NT, obese & normal bowel sounds Central nervous system: Alert & oriented Psychiatry: judgement & insight appears normal. Tearful mood     Data Reviewed: I have personally reviewed following labs and imaging studies  CBC: Recent Labs  Lab 05/28/23 0007  WBC 12.4*  NEUTROABS 7.8*  HGB 15.2  HCT 47.3  MCV 87.4  PLT 305   Basic Metabolic Panel: Recent Labs  Lab 05/28/23 0007  NA 139  K 3.9  CL 106  CO2 24  GLUCOSE 138*  BUN 17  CREATININE 0.68  CALCIUM 9.1   GFR: Estimated Creatinine Clearance: 121 mL/min (by C-G formula based on SCr of 0.68 mg/dL). Liver Function Tests: Recent Labs  Lab 05/28/23 0007  AST 16  ALT 32  ALKPHOS 88  BILITOT 1.0  PROT 8.4*  ALBUMIN 4.2   No results for input(s): "LIPASE", "AMYLASE" in the last 168 hours. No results for input(s): "AMMONIA" in the last 168 hours. Coagulation Profile: Recent Labs  Lab 05/28/23 0007  INR 1.0   Cardiac Enzymes: No results for input(s): "CKTOTAL", "CKMB", "CKMBINDEX", "TROPONINI" in the last 168 hours. BNP (last 3 results) No results for input(s): "PROBNP" in the last 8760 hours. HbA1C: Recent Labs  05/28/23 1012  HGBA1C 6.5*   CBG: Recent Labs  Lab 05/28/23 0002 05/28/23 1142 05/28/23 1655 05/28/23 2044 05/29/23 0745  GLUCAP 132* 123* 112* 96 106*   Lipid Profile: Recent Labs    05/28/23 1012  CHOL 109  HDL 30*  LDLCALC 64  TRIG 77  CHOLHDL 3.6   Thyroid Function Tests: No results for input(s): "TSH", "T4TOTAL", "FREET4", "T3FREE", "THYROIDAB" in the last 72 hours. Anemia Panel: No results for input(s): "VITAMINB12", "FOLATE", "FERRITIN", "TIBC", "IRON", "RETICCTPCT" in the last 72 hours. Sepsis Labs: No results for input(s):  "PROCALCITON", "LATICACIDVEN" in the last 168 hours.  No results found for this or any previous visit (from the past 240 hour(s)).       Radiology Studies: ECHOCARDIOGRAM COMPLETE BUBBLE STUDY  Result Date: 05/28/2023    ECHOCARDIOGRAM REPORT   Patient Name:   Bradley Morrison Date of Exam: 05/28/2023 Medical Rec #:  [id number]       Height:       66.0 in Accession #:    [id number]      Weight:       211.5 lb Date of Birth:  Jan 14, 1976        BSA:          2.048 m Patient Age:    47 years        BP:           164/96 mmHg Patient Gender: M               HR:           74 bpm. Exam Location:  [facility] Procedure: 2D Echo, Cardiac Doppler, Color Doppler and Saline Contrast Bubble            Study Indications:     Stroke 434.91 / I63.9  History:         Patient has no prior history of Echocardiogram examinations.                  Stroke, Signs/Symptoms:Chest Pain; Risk Factors:Hypertension.  Sonographer:     Cristela Blue Referring Phys:  [id number] JAN A MANSY Diagnosing Phys: Debbe Odea MD IMPRESSIONS  1. Left ventricular ejection fraction, by estimation, is 55 to 60%. Left ventricular ejection fraction by PLAX is 55 %. The left ventricle has normal function. The left ventricle has no regional wall motion abnormalities. Left ventricular diastolic parameters are consistent with Grade I diastolic dysfunction (impaired relaxation).  2. Right ventricular systolic function is normal. The right ventricular size is normal.  3. The mitral valve is normal in structure. No evidence of mitral valve regurgitation.  4. The aortic valve was not well visualized. Aortic valve regurgitation is not visualized.  5. The inferior vena cava is normal in size with greater than 50% respiratory variability, suggesting right atrial pressure of 3 mmHg.  6. Agitated saline contrast bubble study was negative, with no evidence of any interatrial shunt. FINDINGS  Left Ventricle: Left ventricular ejection fraction, by estimation, is 55 to 60%. Left  ventricular ejection fraction by PLAX is 55 %. The left ventricle has normal function. The left ventricle has no regional wall motion abnormalities. The left ventricular internal cavity size was normal in size. There is no left ventricular hypertrophy. Left ventricular diastolic parameters are consistent with Grade I diastolic dysfunction (impaired relaxation). Right Ventricle: The right ventricular size is normal. No increase in right ventricular wall thickness. Right ventricular systolic function is normal. Left Atrium: Left atrial size was normal  in size. Right Atrium: Right atrial size was normal in size. Pericardium: There is no evidence of pericardial effusion. Mitral Valve: The mitral valve is normal in structure. No evidence of mitral valve regurgitation. Tricuspid Valve: The tricuspid valve is normal in structure. Tricuspid valve regurgitation is not demonstrated. Aortic Valve: The aortic valve was not well visualized. Aortic valve regurgitation is not visualized. Aortic valve mean gradient measures 4.0 mmHg. Aortic valve peak gradient measures 7.7 mmHg. Aortic valve area, by VTI measures 2.37 cm. Pulmonic Valve: The pulmonic valve was not well visualized. Pulmonic valve regurgitation is not visualized. Aorta: The aortic root is normal in size and structure. Venous: The inferior vena cava is normal in size with greater than 50% respiratory variability, suggesting right atrial pressure of 3 mmHg. IAS/Shunts: No atrial level shunt detected by color flow Doppler. Agitated saline contrast was given intravenously to evaluate for intracardiac shunting. Agitated saline contrast bubble study was negative, with no evidence of any interatrial shunt.  LEFT VENTRICLE PLAX 2D LV EF:         Left            Diastology                ventricular     LV e' medial:    6.20 cm/s                ejection        LV E/e' medial:  12.5                fraction by     LV e' lateral:   10.20 cm/s                PLAX is 55      LV  E/e' lateral: 7.6                %. LVIDd:         4.20 cm LVIDs:         3.00 cm LV PW:         0.90 cm LV IVS:        1.10 cm LVOT diam:     2.10 cm LV SV:         61 LV SV Index:   30 LVOT Area:     3.46 cm  RIGHT VENTRICLE RV Basal diam:  2.60 cm RV Mid diam:    2.50 cm RV S prime:     12.80 cm/s TAPSE (M-mode): 2.6 cm LEFT ATRIUM           Index        RIGHT ATRIUM          Index LA diam:      2.80 cm 1.37 cm/m   RA Area:     9.94 cm LA Vol (A2C): 75.7 ml 36.96 ml/m  RA Volume:   20.70 ml 10.11 ml/m LA Vol (A4C): 22.3 ml 10.89 ml/m  AORTIC VALVE AV Area (Vmax):    2.62 cm AV Area (Vmean):   2.36 cm AV Area (VTI):     2.37 cm AV Vmax:           139.00 cm/s AV Vmean:          98.000 cm/s AV VTI:            0.259 m AV Peak Grad:      7.7 mmHg AV Mean Grad:      4.0 mmHg LVOT Vmax:  105.00 cm/s LVOT Vmean:        66.900 cm/s LVOT VTI:          0.177 m LVOT/AV VTI ratio: 0.68  AORTA Ao Root diam: 3.20 cm MITRAL VALVE               TRICUSPID VALVE MV Area (PHT): 3.33 cm    TR Peak grad:   11.2 mmHg MV Decel Time: 228 msec    TR Vmax:        167.00 cm/s MV E velocity: 77.60 cm/s MV A velocity: 95.40 cm/s  SHUNTS MV E/A ratio:  0.81        Systemic VTI:  0.18 m                            Systemic Diam: 2.10 cm Debbe Odea MD Electronically signed by Debbe Odea MD Signature Date/Time: 05/28/2023/11:59:33 AM    Final    MR BRAIN WO CONTRAST  Result Date: 05/28/2023 CLINICAL DATA:  Acute neurologic deficit EXAM: MRI HEAD WITHOUT CONTRAST TECHNIQUE: Multiplanar, multiecho pulse sequences of the brain and surrounding structures were obtained without intravenous contrast. COMPARISON:  Head CT 05/27/2023 FINDINGS: Brain: Multifocal acute ischemia within the ventral pons. No acute or chronic hemorrhage. Normal white matter signal, parenchymal volume and CSF spaces. The midline structures are normal. Vascular: Major flow voids are preserved. Skull and upper cervical spine: Normal calvarium and  skull base. Multiple scalp soft tissue lesions likely inclusion cysts. Sinuses/Orbits:No paranasal sinus fluid levels or advanced mucosal thickening. No mastoid or middle ear effusion. Normal orbits. IMPRESSION: Multifocal acute ischemia within the ventral pons. No hemorrhage or mass effect. Electronically Signed   By: Deatra Robinson M.D.   On: 05/28/2023 03:19   CT ANGIO HEAD NECK W WO CM W PERF (CODE STROKE)  Addendum Date: 05/28/2023   ADDENDUM REPORT: 05/28/2023 00:58 ADDENDUM: Right vertebral artery V4 segment not visualized. The right vertebral artery probably terminates in PICA. Electronically Signed   By: Deatra Robinson M.D.   On: 05/28/2023 00:58   Result Date: 05/28/2023 CLINICAL DATA:  Right facial weakness and left leg weakness. Dysarthria. EXAM: CT ANGIOGRAPHY HEAD AND NECK CT PERFUSION BRAIN TECHNIQUE: Multidetector CT imaging of the head and neck was performed using the standard protocol during bolus administration of intravenous contrast. Multiplanar CT image reconstructions and MIPs were obtained to evaluate the vascular anatomy. Carotid stenosis measurements (when applicable) are obtained utilizing NASCET criteria, using the distal internal carotid diameter as the denominator. Multiphase CT imaging of the brain was performed following IV bolus contrast injection. Subsequent parametric perfusion maps were calculated using RAPID software. RADIATION DOSE REDUCTION: This exam was performed according to the departmental dose-optimization program which includes automated exposure control, adjustment of the mA and/or kV according to patient size and/or use of iterative reconstruction technique. CONTRAST:  OMNIPAQUE IOHEXOL 350 MG/ML SOLN COMPARISON:  None Available. FINDINGS: CT Brain Perfusion Findings: ASPECTS: 10 CBF (<30%) Volume: 0mL Perfusion (Tmax>6.0s) volume: 0mL Mismatch Volume: 0mL Infarction Location:None CTA NECK FINDINGS SKELETON: There is no bony spinal canal stenosis. No lytic or  blastic lesion. OTHER NECK: Normal pharynx, larynx and major salivary glands. No cervical lymphadenopathy. Unremarkable thyroid gland. UPPER CHEST: No pneumothorax or pleural effusion. No nodules or masses. AORTIC ARCH: There is no calcific atherosclerosis of the aortic arch. Conventional 3 vessel aortic branching pattern. RIGHT CAROTID SYSTEM: Normal without aneurysm, dissection or stenosis. LEFT CAROTID SYSTEM: Normal without aneurysm,  dissection or stenosis. VERTEBRAL ARTERIES: Left dominant configuration.There is no dissection, occlusion or flow-limiting stenosis to the skull base (V1-V3 segments). CTA HEAD FINDINGS POSTERIOR CIRCULATION: --Vertebral arteries: Normal V4 segments. --Inferior cerebellar arteries: Normal. --Basilar artery: Normal. --Superior cerebellar arteries: Normal. --Posterior cerebral arteries (PCA): Normal. ANTERIOR CIRCULATION: --Intracranial internal carotid arteries: Normal. --Anterior cerebral arteries (ACA): Normal. Both A1 segments are present. Patent anterior communicating artery (a-comm). --Middle cerebral arteries (MCA): Normal. VENOUS SINUSES: As permitted by contrast timing, patent. ANATOMIC VARIANTS: None Review of the MIP images confirms the above findings. IMPRESSION: 1. No emergent large vessel occlusion or high-grade stenosis of the intracranial arteries. 2. Normal CT perfusion. Electronically Signed: By: Deatra Robinson M.D. On: 05/28/2023 00:55   CT HEAD CODE STROKE WO CONTRAST  Result Date: 05/28/2023 CLINICAL DATA:  Code stroke.  Aphasia EXAM: CT HEAD WITHOUT CONTRAST TECHNIQUE: Contiguous axial images were obtained from the base of the skull through the vertex without intravenous contrast. RADIATION DOSE REDUCTION: This exam was performed according to the departmental dose-optimization program which includes automated exposure control, adjustment of the mA and/or kV according to patient size and/or use of iterative reconstruction technique. COMPARISON:  None Available.  FINDINGS: Brain: There is no mass, hemorrhage or extra-axial collection. The size and configuration of the ventricles and extra-axial CSF spaces are normal. The brain parenchyma is normal, without evidence of acute or chronic infarction. Vascular: No abnormal hyperdensity of the major intracranial arteries or dural venous sinuses. No intracranial atherosclerosis. Skull: Normal calvarium.  Left anterior scalp trichilemmal cyst. Sinuses/Orbits: No fluid levels or advanced mucosal thickening of the visualized paranasal sinuses. No mastoid or middle ear effusion. The orbits are normal. ASPECTS [facility] Stroke Program Early CT Score) - Ganglionic level infarction (caudate, lentiform nuclei, internal capsule, insula, M1-M3 cortex): 7 - Supraganglionic infarction (M4-M6 cortex): 3 Total score (0-10 with 10 being normal): 10 IMPRESSION: 1. No acute intracranial abnormality. 2. ASPECTS is 10. These results were called by telephone at the time of interpretation on 05/28/2023 at 12:02 am to provider [facility] , who verbally acknowledged these results. Electronically Signed   By: Deatra Robinson M.D.   On: 05/28/2023 00:03        Scheduled Meds:  amLODipine  10 mg Oral Daily   atorvastatin  80 mg Oral Daily   clopidogrel  75 mg Oral Daily   enoxaparin (LOVENOX) injection  50 mg Subcutaneous Q24H   insulin aspart  0-6 Units Subcutaneous TID WC   losartan  25 mg Oral Daily   Continuous Infusions:     LOS: 1 day    Time spent: 35 mins     [name], MD [facility] Pager [phone]  If 7PM-7AM, please contact night-coverage [email or web address] 05/29/2023, 8:15 AM

## 2023-05-29 NOTE — Evaluation (Signed)
Speech Language Pathology Evaluation Patient Details Name: TAELYN AMAN MRN: [id number] DOB: Apr 27, 1976 Today's Date: 05/29/2023 Time: 6578-4696 SLP Time Calculation (min) (ACUTE ONLY): 20 min  Problem List:  Patient Active Problem List   Diagnosis Date Noted   Acute ischemic stroke (HCC) 05/28/2023   Dyslipidemia 05/28/2023   Essential hypertension 05/28/2023   Type 2 diabetes mellitus with peripheral neuropathy (HCC) 05/28/2023   Marijuana abuse 05/28/2023   CVA (cerebral vascular accident) (HCC) 05/28/2023   Obstructive sleep apnea 03/05/2020   Recurrent major depressive disorder, in partial remission (HCC) 08/25/2018   Anxiety    Obesity (BMI 30-39.9) 10/31/2014   Hypertension associated with diabetes (HCC) 05/21/2014   Past Medical History:  Past Medical History:  Diagnosis Date   Anxiety    CP (cerebral palsy) (HCC)    Hypertension    Stroke [facility])    Past Surgical History: History reviewed. No pertinent surgical history. HPI:  Pt is a 47 year old male with history of hypertension, hyperlipidemia, diabetes mellitus, cerebral palsy (mainly with some foot problems/deformity), recent Right Pontine stroke in the past week(05/18/23), cared for at [facility] with some residual left-sided weakness and minor speech disturbance; pt denied any difficulty swallowing, only min slower chewing.  Per MD admit note, "apparently this evening around 8 PM, he had worsened speech, mainly increased slurred speech. He also has Right facial weakness. Unclear if his left side is weaker than previously. Apparently symptoms resolved, he went to bed at 9 PM and woke up later with recurrent similar symptoms with the Right facial weakness, altered speech and difficulty swallowing.   On examination, patient has moderate to severe Dysarthria which is definitely worse than previously according to family as well as new right facial weakness. He does have significant left leg weakness, unclear if this is worse than  previously. He also has diminished in station of the left limbs. NIHSS = 7. CT head reveals no definite acute findings. Suspect possible new stroke versus recrudescence/worsening of prior stroke deficits. Admit for cerebrovascular workup and management.".   MRI: Brain: Multifocal acute ischemia within the ventral pons. No acute  or chronic hemorrhage. Normal white matter signal, parenchymal  volume and CSF spaces. The midline structures are normal.   Assessment / Plan / Recommendation Clinical Impression  Pt appears to be fair historian and reports that his right side facial weakness is worse than at time of previous stroke.   Per neurology note "Acute and subacute pontine infarction -- expansion of the stroke that initially happened less than 2 weeks ago and initially involve the right pons but now involves the left pons as well-likely small vessel etiology."    At this time, he presents with moderate to severe right sided facial and lingual weakness.   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 This is further complicated by fast rate of speech resulting in < 25% speech intelligibility at the independent/spontaneous sentence level. When prompted to use slower rate of speech his intelligibility is improved to ~ 75% at the sentence level. Recommend continued skilled ST to target pt's motor speech deficits and to further assess for any cognitive linguistic deficits as speech intelligibility improves.    SLP Assessment  SLP Recommendation/Assessment: Patient needs continued Speech Lanaguage Pathology Services SLP Visit Diagnosis: Dysarthria and anarthria (R47.1)    Recommendations for follow up therapy are one component of a multi-disciplinary discharge planning  process, led by the attending physician.  Recommendations may be  updated based on patient status, additional functional criteria and insurance authorization.    Follow Up Recommendations  Acute inpatient rehab (3hours/day)    Assistance Recommended at Discharge  Intermittent Supervision/Assistance  Functional Status Assessment Patient has had a recent decline in their functional status and demonstrates the ability to make significant improvements in function in a reasonable and predictable amount of time.  Frequency and Duration min 2x/week  2 weeks      SLP Evaluation Cognition    Pending further testing as speech intelligibility improves      Comprehension  Auditory Comprehension Overall Auditory Comprehension: Appears within functional limits for tasks assessed    Expression Expression Primary Mode of Expression: Verbal Verbal Expression Overall Verbal Expression: Impaired (d/t motor speech disorder) Initiation: No impairment Automatic Speech: Name Level of Generative/Spontaneous Verbalization: Conversation Repetition: Impaired Level of Impairment: Word level Naming: No impairment Pragmatics: No impairment Interfering Components: Speech intelligibility Non-Verbal Means of Communication: Not applicable Written Expression Dominant Hand: Right Written Expression: Not tested   Oral / Motor  Oral Motor/Sensory Function Overall Oral Motor/Sensory Function: Severe impairment Facial ROM: Reduced right;Suspected CN VII (facial) dysfunction Facial Symmetry: Abnormal symmetry right;Suspected CN VII (facial) dysfunction Facial Strength: Reduced right;Suspected CN VII (facial) dysfunction Facial Sensation: Reduced right;Suspected CN V (Trigeminal) dysfunction Lingual ROM: Suspected CN XII (hypoglossal) dysfunction Lingual Symmetry: Abnormal symmetry right;Suspected CN XII (hypoglossal) dysfunction Lingual Strength: Within Functional Limits Lingual Sensation: Reduced;Suspected CN VII (facial) dysfunction-anterior 2/3 tongue Velum: Suspected CN  X (Vagus) dysfunction (mild hypernasality) Mandible: Within Functional Limits Motor Speech Overall Motor Speech: Impaired Respiration: Within functional limits Phonation: Low vocal intensity;Hoarse;Wet Resonance: Hypernasality Articulation: Impaired Level of Impairment: Word Intelligibility: Intelligibility reduced Word: 50-74% accurate Phrase: 50-74% accurate Sentence: 25-49% accurate Conversation: 25-49% accurate Effective Techniques: Slow rate;Increased vocal intensity           Micah Galeno B. Dreama Saa, M.S., CCC-SLP, Tree surgeon Certified Brain Injury Specialist [facility]  [facility] Rehabilitation Services Office [phone] Ascom [phone] Fax [phone]

## 2023-05-29 NOTE — TOC Progression Note (Signed)
Transition of Care [facility]) - Progression Note    Patient Details  Name: Bradley Morrison MRN: [id number] Date of Birth: Sep 07, 1976  [facility] CM/SW Contact  Allena Katz, LCSW Phone Number: 05/29/2023, 9:35 AM  Clinical Narrative:   CIR consult placed TOC following.           Expected Discharge Plan and Services                                               Social Determinants of Health (SDOH) Interventions SDOH Screenings   Food Insecurity: No Food Insecurity (05/28/2023)  Housing: Low Risk  (05/28/2023)  Transportation Needs: No Transportation Needs (05/28/2023)  Utilities: Not At Risk (05/28/2023)  Financial Resource Strain: Low Risk  (05/19/2023)   Received [facility]  Tobacco Use: High Risk (05/28/2023)    Readmission Risk Interventions     No data to display

## 2023-05-29 NOTE — Progress Notes (Signed)
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 Pt very motivated to participate and get better. Pt is able to ambulate room<>gym without AD with mod assist with gait pattern as noted below. Pt engaged in standing balance activities & negotiating stairs with B rails. Pt is making steady progress with functional mobility & remains an excellent candidate for post acute rehab >3 hours/day.    If plan is discharge home, recommend the following: A lot of help with walking and/or transfers;A little help with bathing/dressing/bathroom;Assistance with cooking/housework;Direct supervision/assist for financial management;Assist for transportation;Help with stairs or ramp for entrance;Direct supervision/assist for medications management   Can travel by private vehicle        Equipment Recommendations  None recommended by PT (defer to next venue)    Recommendations for Other Services       Precautions / Restrictions Precautions Precautions: Fall Restrictions Weight Bearing Restrictions: No     Mobility  Bed Mobility               General bed mobility comments: not tested, pt received in recliner, left sitting on BSC in shower    Transfers Overall transfer level: Needs assistance Equipment used: None Transfers: Sit to/from Stand Sit to Stand: Min assist           General transfer comment: good awareness/recall of hand placement    Ambulation/Gait Ambulation/Gait assistance: Mod assist Gait Distance (Feet): 80 Feet (80  ft) Assistive device: None Gait Pattern/deviations: Step-through pattern, Decreased step length - right, Decreased step length - left, Decreased dorsiflexion - right, Decreased dorsiflexion - left, Decreased stride length, Decreased weight shift to left, Decreased stance time - left, Wide base of support Gait velocity: decreased     General Gait Details: Decreased foot clearance BLE (some improvement LLE with cuing), decreased heel strike BLE, decreased reciprocal arm swing, slight anterior lean, slight forwad trunk flexion, guarded gait. Several LOB with mod<>max assist to correct with pt also attempting.   Stairs Stairs: Yes Stairs assistance: Min assist, Mod assist Stair Management: Two rails, Step to pattern Number of Stairs: 4 (6") General stair comments: PT provides demonstration & education re: compensatory pattern. Pt ascends stairs with B rails & min assist, min<>mod assist to safely descend stairs.   Wheelchair Mobility     Tilt Bed    Modified Rankin (Stroke Patients Only)       Balance Overall balance assessment: Needs assistance Sitting-balance support: Feet supported, No upper extremity supported Sitting balance-Leahy Scale: Good     Standing balance support: During functional activity, No upper extremity supported Standing balance-Leahy Scale: Poor                              Cognition Arousal: Alert Behavior During Therapy: WFL for tasks assessed/performed Overall Cognitive Status: Impaired/Different from baseline Area of Impairment: Problem solving  General Comments: Pleasant, extremely motivated to participate        Exercises Other Exercises Other Exercises: Pt engaged in standing step taps with RUE support then LUE support with mod assist with focus on weight shifting L<>R, balance, BLE NMR, and BLE strengthening.    General Comments        Pertinent Vitals/Pain Pain Assessment Pain  Assessment: No/denies pain    Home Living     Available Help at Discharge: Family;Available 24 hours/day Type of Home: House                  Prior Function            PT Goals (current goals can now be found in the care plan section) Acute Rehab PT Goals Patient Stated Goal: return home PT Goal Formulation: With patient Time For Goal Achievement: 06/11/23 Potential to Achieve Goals: Good Progress towards PT goals: Progressing toward goals    Frequency    Min 1X/week      PT Plan      Co-evaluation              AM-PAC PT "6 Clicks" Mobility   Outcome Measure  Help needed turning from your back to your side while in a flat bed without using bedrails?: A Little Help needed moving from lying on your back to sitting on the side of a flat bed without using bedrails?: A Little Help needed moving to and from a bed to a chair (including a wheelchair)?: A Lot Help needed standing up from a chair using your arms (e.g., wheelchair or bedside chair)?: A Little Help needed to walk in hospital room?: A Lot Help needed climbing 3-5 steps with a railing? : A Lot 6 Click Score: 15    End of Session Equipment Utilized During Treatment: Gait belt Activity Tolerance: Patient tolerated treatment well Patient left:  (in shower, sitting on BSC, in care of NT)   PT Visit Diagnosis: Other symptoms and signs involving the nervous system (R29.898);Other abnormalities of gait and mobility (R26.89);Difficulty in walking, not elsewhere classified (R26.2);Muscle weakness (generalized) (M62.81);Unsteadiness on feet (R26.81);Hemiplegia and hemiparesis Hemiplegia - Right/Left: Left     Time: 1350-1416 PT Time Calculation (min) (ACUTE ONLY): 26 min  Charges:    $Neuromuscular Re-education: 23-37 mins PT General Charges $$ ACUTE PT VISIT: 1 Visit                     Aleda Grana, PT, DPT 05/29/23, 2:39 PM   Sandi Mariscal 05/29/2023, 2:37 PM

## 2023-05-29 NOTE — Progress Notes (Addendum)
Neurology Progress Note   S:// Seen and examined. No acute changes overnight  O:// Current vital signs: BP (!) 127/90 (BP Location: Left Arm)   Pulse 73   Temp 99.2 F (37.3 C)   Resp 17   Ht 5\' 6"  (1.676 m)   Wt 95.9 kg   SpO2 95%   BMI 34.14 kg/m  Vital signs in last 24 hours: Temp:  [97.7 F (36.5 C)-99.2 F (37.3 C)] 99.2 F (37.3 C) (08/10 0746) Pulse Rate:  [73-76] 73 (08/10 0746) Resp:  [16-18] 17 (08/10 0746) BP: (118-141)/(79-95) 127/90 (08/10 0746) SpO2:  [94 %-98 %] 95 % (08/10 0746) General: Awake alert in no distress HEENT: Normocephalic atraumatic c Lungs are clear Cardiovascular: Regular rhythm Abdomen nondistended nontender Neurological exam Is awake alert oriented x 3 His speech is moderate to severely dysarthric No evidence of aphasia Follows all commands Cranial nerves: Pupils equal round react light, extraocular movements intact, visual fields full, facial examination reveals right lower facial asymmetry in comparison to the left, tongue and palate midline. Motor examination with no drift in any of the 4 extremities but the left lower extremity is mildly weaker with 4+/5 strength whereas all other extremities are 5/5. Sensation intact Coordination exam with no gross dysmetria   Medications  Current Facility-Administered Medications:    acetaminophen (TYLENOL) tablet 650 mg, 650 mg, Oral, Q4H PRN **OR** acetaminophen (TYLENOL) 160 MG/5ML solution 650 mg, 650 mg, Per Tube, Q4H PRN **OR** acetaminophen (TYLENOL) suppository 650 mg, 650 mg, Rectal, Q4H PRN, Mansy, Jan A, MD   amLODipine (NORVASC) tablet 10 mg, 10 mg, Oral, Daily, Mansy, Jan A, MD, 10 mg at 05/28/23 1019   atorvastatin (LIPITOR) tablet 80 mg, 80 mg, Oral, Daily, Mansy, Jan A, MD, 80 mg at 05/28/23 1019   clopidogrel (PLAVIX) tablet 75 mg, 75 mg, Oral, Daily, Mansy, Jan A, MD, 75 mg at 05/28/23 1019   enoxaparin (LOVENOX) injection 50 mg, 50 mg, Subcutaneous, Q24H, Mansy, Jan A, MD, 50  mg at 05/28/23 1019   insulin aspart (novoLOG) injection 0-6 Units, 0-6 Units, Subcutaneous, TID WC, [name], MD, 1 Units at 05/28/23 1310   losartan (COZAAR) tablet 25 mg, 25 mg, Oral, Daily, Mansy, Jan A, MD, 25 mg at 05/28/23 1019   morphine (PF) 2 MG/ML injection 1 mg, 1 mg, Intravenous, Q3H PRN, [name], MD, 1 mg at 05/28/23 8115   senna-docusate (Senokot-S) tablet 1 tablet, 1 tablet, Oral, QHS PRN, Mansy, Jan A, MD  Labs CBC    Component Value Date/Time   WBC 12.4 (H) 05/28/2023 0007   RBC 5.41 05/28/2023 0007   HGB 15.2 05/28/2023 0007   HGB 14.3 01/02/2014 0412   HCT 47.3 05/28/2023 0007   HCT 44.6 01/02/2014 0412   PLT 305 05/28/2023 0007   PLT 220 01/02/2014 0412   MCV 87.4 05/28/2023 0007   MCV 87 01/02/2014 0412   MCH 28.1 05/28/2023 0007   MCHC 32.1 05/28/2023 0007   RDW 13.5 05/28/2023 0007   RDW 13.6 01/02/2014 0412   LYMPHSABS 2.8 05/28/2023 0007   LYMPHSABS 0.8 (L) 01/02/2014 0412   MONOABS 1.5 (H) 05/28/2023 0007   MONOABS 0.7 01/02/2014 0412   EOSABS 0.2 05/28/2023 0007   EOSABS 0.2 01/02/2014 0412   BASOSABS 0.1 05/28/2023 0007   BASOSABS 0.5 (H) 01/02/2014 0412    CMP     Component Value Date/Time   NA 139 05/28/2023 0007   NA 138 01/02/2014 0412   K 3.9 05/28/2023  0007   K 3.5 01/02/2014 0412   CL 106 05/28/2023 0007   CL 108 (H) 01/02/2014 0412   CO2 24 05/28/2023 0007   CO2 27 01/02/2014 0412   GLUCOSE 138 (H) 05/28/2023 0007   GLUCOSE 110 (H) 01/02/2014 0412   BUN 17 05/28/2023 0007   BUN 15 01/02/2014 0412   CREATININE 0.68 05/28/2023 0007   CREATININE 0.99 01/02/2014 0412   CALCIUM 9.1 05/28/2023 0007   CALCIUM 8.3 (L) 01/02/2014 0412   PROT 8.4 (H) 05/28/2023 0007   PROT 7.9 01/02/2014 0412   ALBUMIN 4.2 05/28/2023 0007   ALBUMIN 3.9 01/02/2014 0412   AST 16 05/28/2023 0007   AST 30 01/02/2014 0412   ALT 32 05/28/2023 0007   ALT 65 01/02/2014 0412   ALKPHOS 88 05/28/2023 0007   ALKPHOS 78 01/02/2014 0412    BILITOT 1.0 05/28/2023 0007   BILITOT 0.4 01/02/2014 0412   GFRNONAA >60 05/28/2023 0007   GFRNONAA >60 01/02/2014 0412   GFRAA >60 04/22/2015 1303   GFRAA >60 01/02/2014 0412    Lipid Panel     Component Value Date/Time   CHOL 109 05/28/2023 1012   TRIG 77 05/28/2023 1012   HDL 30 (L) 05/28/2023 1012   CHOLHDL 3.6 05/28/2023 1012   VLDL 15 05/28/2023 1012   LDLCALC 64 05/28/2023 1012    Lab Results  Component Value Date   HGBA1C 6.5 (H) 05/28/2023    A1c 6.5 LDL 64 2D echo with LVEF 55 to 60%, bubble study negative, LA size normal.   Imaging I have reviewed images in epic and the results pertinent to this consultation are: MRI of the brain shows multifocal ventral pontine infarct. CT angiography head and neck shows no acute issues.  Right VA ends in PICA-no LVO.  Assessment: 47 year old history of hypertension hyperlipidemia diabetes, history of cerebral palsy and recent right pontine stroke seen at [facility] with residual left-sided weakness and minimal dysarthria brought in for evaluation at [facility] for worsening of dysarthria.  Evaluated by telestroke and admitted for further evaluation for new stroke versus recrudescence of old symptoms. Prior images not available to review but the report from [facility] said right pontine stroke whereas MRI of the brain completed at [facility] yesterday shows bilateral pontine infarcts-likely expansion of the stroke due to small vessel etiology. CTA head and neck unremarkable for acute process. Stroke workup has been recently completed and completed here as well.  Prior discharge from [facility] with aspirin only.  Will consider dual antiplatelets for 3 weeks now-see detailed recommendations below  Impression: Acute and subacute pontine infarction -- expansion of the stroke that initially happened less than 2 weeks ago and initially involve the right pons but now involves the left pons as well-likely small vessel  etiology  Recommendations: Telemetry Frequent neurochecks Aspirin 81+ Plavix 75-for 3 weeks followed by aspirin only. Atorvastatin 80 which was started at [facility]-continue for goal LDL less than 70 A1c is at goal of less than 7. PT OT speech therapy Counseled on smoking cessation extensively.  Will need to continue to follow-up with PCP for smoking cessation.  In addition to the above testing, given his age under 50 years, I will order hypercoagulable workup which will need outpatient follow-up.  Outpatient neurology follow-up in 4 to 6 weeks.  Plan relayed to Dr. Mayford Knife -- Milon Dikes, MD Neurologist [facility] Pager: [phone]

## 2023-05-29 NOTE — Progress Notes (Signed)
Speech Language Pathology Treatment: Dysphagia  Patient Details Name: Bradley Morrison MRN: [id number] DOB: [birth year] Today's Date: 05/29/2023 Time: 1040-1055 SLP Time Calculation (min) (ACUTE ONLY): 15 min  Assessment / Plan / Recommendation Clinical Impression  Pt was sitting in his recliner with his mother, Elnita Maxwell, present throughout this treatment. Pt has voiced to his team that he is very hungry.   At this time he presents with mild oral phase dysphagia and s/s concerning for moderate to severe pharyngeal phase dysphagia when consuming ice chips, nectar thick liquids spoon, pudding thick liquids via spoon and graham crackers. Pt and his mother do state that pt has a "smoker's cough" at baseline prior to any PO consumption today.   Oral Phase He continues with moderate to severe right sided facial weakness and mild reported decreased sensation on the right. However, when consuming more advanced solids (pudding and graham crackers), pt was observed using lingual sweep independently with no food residue pocketing on his right.   Pharyngeal Phase Pt presents with immediate throat clearing and coughing when consuming ice chips as well as nectar thick liquids via spoon. Suspect pt's pharyngeal phase dysphagia is sensorimotor in nature related to site of lesion. Per neurology note "Acute and subacute pontine infarction -- expansion of the stroke that initially happened less than 2 weeks ago and initially involve the right pons but now involves the left pons as well-likely small vessel etiology." At bedside, pt was free of overt s/s of aspiration when consuming  pudding thick liquids. Most conservative diet recommendation would be dysphagia 2 diet with pudding thick liquids, medicine whole in pudding. While ST will follow for further trials of advanced liquids, it is likely that pt will need an instrumental swallow study at next available time (Monday).   Extensive education provided to pt and his  mother on the difference between a "smoker's cough" and cough related to possible aspiration. Education provided on site of lesion, concern for dysphagia/aspiration  and POC including Modified Barium Swallow Study.     HPI HPI: Pt is a 47 year old male with history of hypertension, hyperlipidemia, diabetes mellitus, cerebral palsy (mainly with some foot problems/deformity), recent Right Pontine stroke in the past week(05/18/23), cared for at [facility] with some residual left-sided weakness and minor speech disturbance; pt denied any difficulty swallowing, only min slower chewing.  Per MD admit note, "apparently this evening around 8 PM, he had worsened speech, mainly increased slurred speech. He also has Right facial weakness. Unclear if his left side is weaker than previously. Apparently symptoms resolved, he went to bed at 9 PM and woke up later with recurrent similar symptoms with the Right facial weakness, altered speech and difficulty swallowing.   On examination, patient has moderate to severe Dysarthria which is definitely worse than previously according to family as well as new right facial weakness. He does have significant left leg weakness, unclear if this is worse than previously. He also has diminished in station of the left limbs. NIHSS = 7. CT head reveals no definite acute findings. Suspect possible new stroke versus recrudescence/worsening of prior stroke deficits. Admit for cerebrovascular workup and management.".   MRI: Brain: Multifocal acute ischemia within the ventral pons. No acute  or chronic hemorrhage. Normal white matter signal, parenchymal  volume and CSF spaces. The midline structures are normal.      SLP Plan  Continue with current plan of care;MBS      Recommendations for follow up therapy are one component of a multi-disciplinary discharge  planning process, led by the attending physician.  Recommendations may be updated based on patient status, additional functional criteria and  insurance authorization.    Recommendations  Diet recommendations: Dysphagia 2 (fine chop);Pudding-thick liquid Liquids provided via: Teaspoon Medication Administration: Whole meds with puree Supervision: Patient able to self feed;Intermittent supervision to cue for compensatory strategies Compensations: Minimize environmental distractions;Slow rate;Small sips/bites Postural Changes and/or Swallow Maneuvers: Out of bed for meals;Seated upright 90 degrees;Upright 30-60 min after meal                  Oral care BID   Intermittent Supervision/Assistance Dysphagia, oropharyngeal phase (R13.12)     Continue with current plan of care;MBS    Arelia Volpe B. Dreama Saa, M.S., CCC-SLP, Tree surgeon Certified Brain Injury Specialist [facility]  [facility] Office [phone] Ascom [phone] Fax [phone]

## 2023-05-30 ENCOUNTER — Inpatient Hospital Stay: Payer: Medicare HMO

## 2023-05-30 ENCOUNTER — Encounter: Payer: Self-pay | Admitting: Radiology

## 2023-05-30 DIAGNOSIS — I639 Cerebral infarction, unspecified: Secondary | ICD-10-CM | POA: Diagnosis not present

## 2023-05-30 LAB — BASIC METABOLIC PANEL WITH GFR
Anion gap: 10 (ref 5–15)
BUN: 19 mg/dL (ref 6–20)
CO2: 22 mmol/L (ref 22–32)
Calcium: 9 mg/dL (ref 8.9–10.3)
Chloride: 104 mmol/L (ref 98–111)
Creatinine, Ser: 0.7 mg/dL (ref 0.61–1.24)
GFR, Estimated: >60 mL/min (ref >60)
Glucose, Bld: 100 mg/dL — ABNORMAL HIGH (ref 70–99)
Potassium: 3.3 mmol/L — ABNORMAL LOW (ref 3.5–5.1)
Sodium: 136 mmol/L (ref 135–145)

## 2023-05-30 LAB — GLUCOSE, CAPILLARY
Glucose-Capillary: 107 mg/dL — ABNORMAL HIGH (ref 70–99)
Glucose-Capillary: 108 mg/dL — ABNORMAL HIGH (ref 70–99)
Glucose-Capillary: 115 mg/dL — ABNORMAL HIGH (ref 70–99)
Glucose-Capillary: 168 mg/dL — ABNORMAL HIGH (ref 70–99)
Glucose-Capillary: 80 mg/dL (ref 70–99)

## 2023-05-30 LAB — CBC
HCT: 45.6 % (ref 39.0–52.0)
Hemoglobin: 15 g/dL (ref 13.0–17.0)
MCH: 28 pg (ref 26.0–34.0)
MCHC: 32.9 g/dL (ref 30.0–36.0)
MCV: 85.2 fL (ref 80.0–100.0)
Platelets: 294 10*3/uL (ref 150–400)
RBC: 5.35 MIL/uL (ref 4.22–5.81)
RDW: 13.5 % (ref 11.5–15.5)
WBC: 11.4 10*3/uL — ABNORMAL HIGH (ref 4.0–10.5)
nRBC: 0 % (ref 0.0–0.2)

## 2023-05-30 MED ORDER — POLYETHYLENE GLYCOL 3350 17 G PO PACK
17.0000 g | PACK | Freq: Every day | ORAL | Status: DC | PRN
  Administered 2023-05-31: 17 g via ORAL
  Filled 2023-05-30: qty 1

## 2023-05-30 MED ORDER — LORAZEPAM 2 MG/ML IJ SOLN
0.5000 mg | Freq: Once | INTRAMUSCULAR | Status: AC
  Administered 2023-05-30: 0.5 mg via INTRAVENOUS
  Filled 2023-05-30: qty 1

## 2023-05-30 MED ORDER — GUAIFENESIN-DM 100-10 MG/5ML PO SYRP
5.0000 mL | ORAL_SOLUTION | ORAL | Status: DC | PRN
  Administered 2023-05-30: 5 mL via ORAL
  Filled 2023-05-30: qty 10

## 2023-05-30 MED ORDER — DOCUSATE SODIUM 100 MG PO CAPS
200.0000 mg | ORAL_CAPSULE | Freq: Two times a day (BID) | ORAL | Status: DC
  Administered 2023-05-30 – 2023-06-01 (×5): 200 mg via ORAL
  Filled 2023-05-30 (×5): qty 2

## 2023-05-30 NOTE — Progress Notes (Addendum)
       CROSS COVER NOTE  NAME: Bradley Morrison MRN: [id number] DOB : [birth year]    Concern as stated by nurse / staff   "Calling a code stroke" - worsening dysarthria      Pertinent findings on chart review: 47 year old history of hypertension hyperlipidemia diabetes, history of cerebral palsy and recent right pontine stroke seen at [facility] with residual left-sided weakness and minimal dysarthria brought in for evaluation at [facility] hospital for worsening of dysarthria on 8/8 found to have now bilateral pon infarcts compared to right pontine infarct on MRI at [facility] 2 weeks ago.  Patine on brilinnta aspirin and lipitor. Hypercoag workup initiated by neuro Worsening dysarthria episode again tonight. Code stroke called by bedside nurse.  Assessment and  Interventions   Assessment: Per nurse sudden change from some dysarthria to inability to speak. Initial CT  "FINDINGS: Brain: There is no mass, hemorrhage or extra-axial collection. The size and configuration of the ventricles and extra-axial CSF spaces are normal. The brain parenchyma is normal, without evidence of acute or chronic infarction.   Vascular: No abnormal hyperdensity of the major intracranial arteries or dural venous sinuses. No intracranial atherosclerosis.   Skull: The visualized skull base, calvarium and extracranial soft tissues are normal.   Sinuses/Orbits: No fluid levels or advanced mucosal thickening of the visualized paranasal sinuses. No mastoid or middle ear effusion. The orbits are normal.   ASPECTS [facility] Stroke Program Early CT Score)   - Ganglionic level infarction (caudate, lentiform nuclei, internal capsule, insula, M1-M3 cortex): 7   - Supraganglionic infarction (M4-M6 cortex): 3   Total score (0-10 with 10 being normal): 10   IMPRESSION: 1. No acute intracranial abnormality. 2. ASPECTS is 10.   These results were called by telephone at the time of interpretation on 05/30/2023 at 1:03  am to provider Manuela Schwartz, who verbally acknowledged these results."   I seen patient post CT He is alert. He is able now to weakly verbalize also general weakness with hand grips and picking legs off bed against gravity  +upper airway congestion and productive cough - yellow sputum;  afebrile    05/30/2023   12:47 AM 05/29/2023   11:07 PM 05/29/2023    8:32 PM  Vitals with BMI  Systolic 182 135 725  Diastolic 107 81 78  Pulse 112 68 73        Plan: MRI brain WO contrast per tele neuro rec NPO [facility] swallow screen       Donnie Mesa NP [facility] Cross Cover 7pm-7am - check amion for availability Pager [phone]  \  ........................................................Marland KitchenFollow up MRI results............................................................. IMPRESSION: 1. Bilateral lower pontine abnormal diffusion, now more intense on the left side versus two days ago. Increased T2 hyperintensity with no hemorrhage or mass effect. No new areas of involvement. Small-vessel ischemic etiology is possible. Other etiologies of symmetric brain diffusion abnormality were considered (such as toxic exposure or myelinosis), but seem unlikely given the pattern here.   2. New acute intracranial abnormality. And otherwise mild nonspecific cerebral white matter signal changes.  Result not called to me Results sent to Dr Wilford Corner via secure chat Poss need for perfusion study  Donnie Mesa NP [facility] Cross Cover 7pm-7am - check amion for availability Pager [phone]

## 2023-05-30 NOTE — Consult Note (Signed)
[facility] TeleNeurology Consult Services   Patient Name:   Bradley Morrison, Bradley Morrison Date of Birth:   1975/11/17 Identification Number:   MRN - [id number] Date of Service:   05/30/2023 00:57:10  Diagnosis:       I63.89 - Cerebrovascular accident (CVA) due to other mechanism [facility])  Impression:      This is a 47 yo M w a PMHX of stroke, DM, HTN, HLD, who presents to the ED originally on 8/8 with R sided weakness and slurred speech. Found to have a stroke in the pons. Also had a stroke 2 weeks ago. Course complicated today by speaking difficulty and and worsening R sided weakness. He was last known to be at baseline at 23.00. On arrival to the bedside he is without new focal deficits, although with more speech difficulty. his symptoms remain persistent. BP was found to be 182 systolic.          Physical exam largely non-focal. No troubel with comprehension or following commands. Able to mouth answers to all questions. Motor strenght appropriate in all 4 limbs.  Labs as above  Imaging with [facility] pending official read.          Etiology suspicious for evolution of recent stroke, vs. new stroke extension. Would look for a reversible cause from toxic/metabolic/infectious/hemodynamic cause.      -Allow permissive HTN to 180/90 and below  -repeat MRI brain w/o con  -ASA 81. Plavix 75mg  x 90 days  -Labs: UA, utox, VBG, B12, thiamine, ammonia, TSH    Our recommendations are outlined below.  Recommendations:        Stroke/Telemetry Floor       Neuro Checks       Bedside Swallow Eval       DVT Prophylaxis       IV Fluids, Normal Saline       Head of Bed 30 Degrees       Euglycemia and Avoid Hyperthermia (PRN Acetaminophen)    ------------------------------------------------------------------------------  Advanced Imaging: Advanced Imaging Deferred because:  intervenable LVO not suspected   Metrics: Last Known Well: 05/29/2023 23:00:00 [facility]  Notification Time: 05/30/2023 00:57:10 Stamp Time: 05/30/2023 00:57:10 Initial Response Time: 05/30/2023 01:05:50 Symptoms: speech difficulty. Initial patient interaction: 05/30/2023 01:06:12 NIHSS Assessment Completed: 05/30/2023 01:15:19 Patient is not a candidate for Thrombolytic. Thrombolytic Medical Decision: 05/30/2023 01:15:21 Patient was not deemed candidate for Thrombolytic because of following reasons: Stroke in last 3 months.  I personally Reviewed the CT Head and it Showed  Primary Provider Notified of Diagnostic Impression and Management Plan on: 05/30/2023 01:23:40 Spoke With: primary team Able to Reach 05/30/2023 01:23:40    ------------------------------------------------------------------------------  History of Present Illness: Patient is a 47 year old Male.  Inpatient stroke alert was called for symptoms of speech difficulty. This is a 47 yo M w a PMHX of stroke, DM, HTN, HLD, who presents to the ED originally on 8/8 with R sided weakness and slurred speech. Found to have a stroke in the pons. Also had a stroke 2 weeks ago. Course complicated today by speaking difficulty and and worsening R sided weakness. He was last known to be at baseline at 23.00. On arrival to the bedside he is without new focal deficits, although with more speech difficulty. his symptoms remain persistent. BP was found to be 182 systolic. He was taken immediately for [facility] and further evaluation. Decision on whether or not to give pharmacological thrombolysis was made based on indications, contraindications, and patient's disability  status and preference.    Medications:  Anticoagulant use:  Unknown Antiplatelet use: Unknown Reviewed EMR for current medications  Allergies:  NKDA  Social History: Smoking: No Alcohol Use: No Drug Use: No  Family History:  There is no family history of premature cerebrovascular disease pertinent to this consultation  ROS : 14 Points Review of  Systems was performed and was negative except mentioned in HPI.  Past Surgical History: There Is No Surgical History Contributory To Today's Visit    Examination: BP(182/107), Pulse(112), 1A: Level of Consciousness - Alert; keenly responsive + 0 1B: Ask Month and Age - Both Questions Right + 0 1C: Blink Eyes & Squeeze Hands - Performs Both Tasks + 0 2: Test Horizontal Extraocular Movements - Normal + 0 3: Test Visual Fields - No Visual Loss + 0 4: Test Facial Palsy (Use Grimace if Obtunded) - Normal symmetry + 0 5A: Test Left Arm Motor Drift - No Drift for 10 Seconds + 0 5B: Test Right Arm Motor Drift - No Drift for 10 Seconds + 0 6A: Test Left Leg Motor Drift - No Drift for 5 Seconds + 0 6B: Test Right Leg Motor Drift - No Drift for 5 Seconds + 0 7: Test Limb Ataxia (FNF/Heel-Shin) - No Ataxia + 0 8: Test Sensation - Normal; No sensory loss + 0 9: Test Language/Aphasia - Normal; No aphasia + 0 10: Test Dysarthria - Normal + 0 11: Test Extinction/Inattention - No abnormality + 0  NIHSS Score: 0   Pre-Morbid Modified Rankin Scale: 1 Points = No significant disability despite symptoms; able to carry out all usual duties and activities  Spoke with : primary team  This consult was conducted in real time using interactive audio and Immunologist. Patient was informed of the technology being used for this visit and agreed to proceed. Patient located in hospital and provider located at home/office setting.   Patient is being evaluated for possible acute neurologic impairment and high probability of imminent or life-threatening deterioration. I spent total of 35 minutes providing care to this patient, including time for face to face visit via telemedicine, review of medical records, imaging studies and discussion of findings with providers, the patient and/or family.   Dr Richardo Priest   [facility] For Inpatient follow-up with [facility] physician please call [facility]  [phone]. This is not an outpatient service. Post hospital discharge, please contact hospital directly.  Please do not communicate with [facility] physicians via secure chat. If you have any questions, Please contact [facility]. Please call or reconsult our service if there are any clinical or diagnostic changes.

## 2023-05-30 NOTE — Progress Notes (Signed)
Inpatient Rehab Admissions:  Inpatient Rehab Consult received.  I spoke with patient on the telephone for rehabilitation assessment and to discuss goals and expectations of an inpatient rehab admission.  Discussed average length of stay, insurance authorization, discharge home after completion of CIR. He acknowledged understanding. He is interested in pursuing CIR. Pt gave permission to contact mother, Elnita Maxwell. She also acknowledged  understanding of CIR goals and expectations. She is supportive of pt pursuing CIR. She confirmed that she would be able to provide 24/7 support for pt after discharge. Will continue to follow.  Signed: Wolfgang Phoenix, MS, CCC-SLP Admissions Coordinator [phone]

## 2023-05-30 NOTE — Progress Notes (Addendum)
Neurology Progress Note   S:// Inpatient code stroke called overnight-telestroke evaluation requested overnight for worsening speech.  Repeat MRI ordered. MRI shows more prominent areas in bilateral pons compared to the prior MRI from a day ago.   O:// Current vital signs: BP (!) 143/90 (BP Location: Left Arm)   Pulse 82   Temp 98.3 F (36.8 C) (Oral)   Resp 18   Ht 5\' 6"  (1.676 m)   Wt 95.9 kg   SpO2 95%   BMI 34.14 kg/m  Vital signs in last 24 hours: Temp:  [97.4 F (36.3 C)-99 F (37.2 C)] 98.3 F (36.8 C) (08/11 0820) Pulse Rate:  [68-112] 82 (08/11 0820) Resp:  [16-18] 18 (08/11 0820) BP: (115-182)/(78-107) 143/90 (08/11 0820) SpO2:  [94 %-100 %] 95 % (08/11 0820) General: Awake alert in no distress HEENT: Normocephalic atraumatic  Lungs are clear Cardiovascular: Regular rhythm Abdomen nondistended nontender Neurological exam Is awake alert oriented x 3 Speech moderate to severely dysarthric No evidence of aphasia Cranial nerves: Pupils equal round react light, extraocular movements intact, visual fields full, face appears rather symmetric at rest and on smiling, tongue and palate midline. Motor examination today shows left lower extremity strength barely 3/5 which was at least 4+/5 yesterday.  This is worsening from yesterday.  Other extremities unchanged from yesterday. Sensation: Intact Coordination with no gross dysmetria    Medications  Current Facility-Administered Medications:    acetaminophen (TYLENOL) tablet 650 mg, 650 mg, Oral, Q4H PRN, 650 mg at 05/30/23 1018 **OR** acetaminophen (TYLENOL) 160 MG/5ML solution 650 mg, 650 mg, Per Tube, Q4H PRN **OR** acetaminophen (TYLENOL) suppository 650 mg, 650 mg, Rectal, Q4H PRN, Mansy, Jan A, MD   amLODipine (NORVASC) tablet 10 mg, 10 mg, Oral, Daily, Mansy, Jan A, MD, 10 mg at 05/30/23 1018   aspirin EC tablet 81 mg, 81 mg, Oral, Daily, Milon Dikes, MD, 81 mg at 05/29/23 1118   atorvastatin (LIPITOR) tablet  80 mg, 80 mg, Oral, Daily, Mansy, Jan A, MD, 80 mg at 05/30/23 1018   clopidogrel (PLAVIX) tablet 75 mg, 75 mg, Oral, Daily, Mansy, Jan A, MD, 75 mg at 05/30/23 1018   docusate sodium (COLACE) capsule 200 mg, 200 mg, Oral, BID, Mayford Knife, Jamiese M, MD   enoxaparin (LOVENOX) injection 50 mg, 50 mg, Subcutaneous, Q24H, Mansy, Jan A, MD, 50 mg at 05/30/23 1017   insulin aspart (novoLOG) injection 0-6 Units, 0-6 Units, Subcutaneous, TID WC, [name], MD, 1 Units at 05/29/23 1239   losartan (COZAAR) tablet 25 mg, 25 mg, Oral, Daily, Mansy, Jan A, MD, 25 mg at 05/30/23 1018   morphine (PF) 2 MG/ML injection 1 mg, 1 mg, Intravenous, Q3H PRN, [name], MD, 1 mg at 05/28/23 0943   polyethylene glycol (MIRALAX / GLYCOLAX) packet 17 g, 17 g, Oral, Daily PRN, [name], MD   senna-docusate (Senokot-S) tablet 1 tablet, 1 tablet, Oral, QHS PRN, Mansy, Jan A, MD  Labs CBC    Component Value Date/Time   WBC 11.4 (H) 05/30/2023 0512   RBC 5.35 05/30/2023 0512   HGB 15.0 05/30/2023 0512   HGB 14.3 01/02/2014 0412   HCT 45.6 05/30/2023 0512   HCT 44.6 01/02/2014 0412   PLT 294 05/30/2023 0512   PLT 220 01/02/2014 0412   MCV 85.2 05/30/2023 0512   MCV 87 01/02/2014 0412   MCH 28.0 05/30/2023 0512   MCHC 32.9 05/30/2023 0512   RDW 13.5 05/30/2023 0512   RDW 13.6 01/02/2014 0412  LYMPHSABS 2.8 05/28/2023 0007   LYMPHSABS 0.8 (L) 01/02/2014 0412   MONOABS 1.5 (H) 05/28/2023 0007   MONOABS 0.7 01/02/2014 0412   EOSABS 0.2 05/28/2023 0007   EOSABS 0.2 01/02/2014 0412   BASOSABS 0.1 05/28/2023 0007   BASOSABS 0.5 (H) 01/02/2014 0412    CMP     Component Value Date/Time   NA 136 05/30/2023 0512   NA 138 01/02/2014 0412   K 3.3 (L) 05/30/2023 0512   K 3.5 01/02/2014 0412   CL 104 05/30/2023 0512   CL 108 (H) 01/02/2014 0412   CO2 22 05/30/2023 0512   CO2 27 01/02/2014 0412   GLUCOSE 100 (H) 05/30/2023 0512   GLUCOSE 110 (H) 01/02/2014 0412   BUN 19 05/30/2023  0512   BUN 15 01/02/2014 0412   CREATININE 0.70 05/30/2023 0512   CREATININE 0.99 01/02/2014 0412   CALCIUM 9.0 05/30/2023 0512   CALCIUM 8.3 (L) 01/02/2014 0412   PROT 8.4 (H) 05/28/2023 0007   PROT 7.9 01/02/2014 0412   ALBUMIN 4.2 05/28/2023 0007   ALBUMIN 3.9 01/02/2014 0412   AST 16 05/28/2023 0007   AST 30 01/02/2014 0412   ALT 32 05/28/2023 0007   ALT 65 01/02/2014 0412   ALKPHOS 88 05/28/2023 0007   ALKPHOS 78 01/02/2014 0412   BILITOT 1.0 05/28/2023 0007   BILITOT 0.4 01/02/2014 0412   GFRNONAA >60 05/30/2023 0512   GFRNONAA >60 01/02/2014 0412   GFRAA >60 04/22/2015 1303   GFRAA >60 01/02/2014 0412    Lipid Panel     Component Value Date/Time   CHOL 109 05/28/2023 1012   TRIG 77 05/28/2023 1012   HDL 30 (L) 05/28/2023 1012   CHOLHDL 3.6 05/28/2023 1012   VLDL 15 05/28/2023 1012   LDLCALC 64 05/28/2023 1012    Lab Results  Component Value Date   HGBA1C 6.5 (H) 05/28/2023    A1c 6.5 LDL 64 2D echo with LVEF 55 to 60%, bubble study negative, LA size normal.   Imaging I have reviewed images in epic and the results pertinent to this consultation are: MRI of the brain shows multifocal ventral pontine infarct. CT angiography head and neck shows no acute issues.  Right VA ends in PICA-no LVO. Repeat MRI yesterday with multifocal ventral pontine infarcts with more prominent infarcts in prior study.  Assessment: 47 year old history of hypertension hyperlipidemia diabetes, history of cerebral palsy and recent right pontine stroke seen at [facility] with residual left-sided weakness and minimal dysarthria brought in for evaluation at [facility] for worsening of dysarthria.    Evaluated by telestroke and admitted for further evaluation for new stroke versus recrudescence of old symptoms. Prior images not available to review but the report from [facility] said right pontine stroke whereas MRI of the brain completed at [facility] yesterday shows  bilateral pontine infarcts-likely expansion of the stroke due to small vessel etiology. CTA head and neck unremarkable for acute process.  Stroke workup has been recently completed and completed here as well.  Prior discharge from [facility] with aspirin only.  Will consider dual antiplatelets for 3 weeks now-see detailed recommendations below  Overnight had worsening of dysarthria, inpatient code stroke called, repeat telestroke evaluation performed-repeat MRI with more prominent strokes which is not uncommon to see the small vessel strokes expanded get worse before they start getting better.  Discussed management with the stroke team at [facility] as well, and agree with current management-recommendations as below  Impression: Acute and subacute  pontine infarction -- expansion of the stroke that initially happened less than 2 weeks ago and initially involve the right pons but now involves the left pons as well-likely small vessel etiology  Recommendations: Telemetry Frequent neurochecks Aspirin 81+ Plavix 75-for 3 weeks followed by aspirin only. Atorvastatin 80 which was started at [facility]-continue for goal LDL less than 70 A1c is at goal of less than 7. PT OT speech therapy Counseled on smoking cessation extensively.  Will need to continue to follow-up with PCP for smoking cessation. Avoid hypotension-allow permissive hypertension for another day or 2, and then have a blood pressure goal at discharge of closer to 140/90 rather than 120/80.  In addition to the above testing, given his age under 50 years, I will order hypercoagulable workup which will need outpatient follow-up.  Outpatient neurology follow-up in 4 to 6 weeks.  Plan relayed to Dr. Mayford Knife -- Milon Dikes, MD Neurologist [facility] Pager: [phone]

## 2023-05-30 NOTE — Progress Notes (Signed)
Physical Therapy Treatment Patient Details Name: Bradley Morrison MRN: [id number] DOB: Sep 23, 1976 Today's Date: 05/30/2023   History of Present Illness Pt is a 47 y/o M admitted on 05/27/23 after presenting with c/o acute onset of worsening dysarthria & R facial droop. Pt recently admitted to [facility] & found to have R pontine stroke. During this admission pt found to have acute & subacute pontine infarction (expansion of stroke that initially happened <2 weeks ago, initially involving R pons but now involving L pons as well). Overnight on 8/10 pt with worsening symptoms & MRI showed more prominent areas in B pons compared to prior imaging from a day ago. PMH: hypertension, type 2 diabetes mellitus, anxiety, Cerebral palsy    PT Comments  MD cleared pt for participation in therapy. Pt received in bed with mother present for session, pt agreeable & motivated to participate. Session focused on gait training with pt presenting with similar presentation as compared to yesterday, slightly slower gait speed. PT provides cuing for upright posture & forward head vs downward head as pt with progressive crouched posture as he walks. Pt asking about braces with PT educating him on possible use of AFO in the future. Continue to recommend PT services to address balance, NMR, gait with LRAD & safety with mobility.   If plan is discharge home, recommend the following: A lot of help with walking and/or transfers;A little help with bathing/dressing/bathroom;Assistance with cooking/housework;Direct supervision/assist for financial management;Assist for transportation;Help with stairs or ramp for entrance;Direct supervision/assist for medications management   Can travel by private vehicle        Equipment Recommendations  None recommended by PT (defer to next venue)    Recommendations for Other Services       Precautions / Restrictions Precautions Precautions: Fall Restrictions Weight Bearing Restrictions: No      Mobility  Bed Mobility Overal bed mobility: Needs Assistance Bed Mobility: Supine to Sit     Supine to sit: HOB elevated, Supervision     General bed mobility comments: utilized bed rails, HOB slightly elevated    Transfers Overall transfer level: Needs assistance Equipment used: None Transfers: Sit to/from Stand Sit to Stand: Min assist, Mod assist           General transfer comment: STS from EOB, low bench in hallway, toilet in bathroom with grab bar with min/mod assist, extra time to power up to standing    Ambulation/Gait Ambulation/Gait assistance: Mod assist Gait Distance (Feet): 80 Feet (+ 80 ft) Assistive device: None Gait Pattern/deviations: Step-through pattern, Decreased step length - right, Decreased step length - left, Decreased dorsiflexion - right, Decreased dorsiflexion - left, Decreased stride length, Decreased weight shift to left, Decreased stance time - left, Wide base of support Gait velocity: decreased     General Gait Details: Decreased heel strike BLE, decreased R foot clearance compared to LLE during swing phase, pt with intermittent L knee genu recurvatum in stance phase, pt with c/o slight L foot inversion during stance phase (PT observed). Pt with occasional LOB with mod<>max assist to correct.   Stairs             Wheelchair Mobility     Tilt Bed    Modified Rankin (Stroke Patients Only)       Balance Overall balance assessment: Needs assistance Sitting-balance support: Feet supported, No upper extremity supported Sitting balance-Leahy Scale: Good     Standing balance support: During functional activity, No upper extremity supported Standing balance-Leahy Scale: Poor  Cognition Arousal: Alert Behavior During Therapy: WFL for tasks assessed/performed Overall Cognitive Status: Impaired/Different from baseline Area of Impairment: Problem solving                                General Comments: Pleasant, extremely motivated to participate        Exercises      General Comments General comments (skin integrity, edema, etc.): Pt toileted with continent void.      Pertinent Vitals/Pain Pain Assessment Pain Assessment: No/denies pain    Home Living                          Prior Function            PT Goals (current goals can now be found in the care plan section) Acute Rehab PT Goals Patient Stated Goal: return home PT Goal Formulation: With patient Time For Goal Achievement: 06/11/23 Potential to Achieve Goals: Good Progress towards PT goals: Progressing toward goals    Frequency    Min 1X/week      PT Plan      Co-evaluation              AM-PAC PT "6 Clicks" Mobility   Outcome Measure  Help needed turning from your back to your side while in a flat bed without using bedrails?: A Little Help needed moving from lying on your back to sitting on the side of a flat bed without using bedrails?: A Little Help needed moving to and from a bed to a chair (including a wheelchair)?: A Lot Help needed standing up from a chair using your arms (e.g., wheelchair or bedside chair)?: A Lot Help needed to walk in hospital room?: A Lot Help needed climbing 3-5 steps with a railing? : A Lot 6 Click Score: 14    End of Session Equipment Utilized During Treatment: Gait belt Activity Tolerance: Patient tolerated treatment well Patient left: in chair;with call bell/phone within reach;with chair alarm set;with family/visitor present   PT Visit Diagnosis: Other symptoms and signs involving the nervous system (R29.898);Other abnormalities of gait and mobility (R26.89);Difficulty in walking, not elsewhere classified (R26.2);Muscle weakness (generalized) (M62.81);Unsteadiness on feet (R26.81);Hemiplegia and hemiparesis Hemiplegia - Right/Left: Left     Time: 5784-6962 PT Time Calculation (min) (ACUTE ONLY): 28 min  Charges:     $Neuromuscular Re-education: 23-37 mins PT General Charges $$ ACUTE PT VISIT: 1 Visit                     Aleda Grana, PT, DPT 05/30/23, 12:47 PM   Sandi Mariscal 05/30/2023, 12:39 PM

## 2023-05-30 NOTE — Progress Notes (Signed)
Patient is expectorating yellow sputum and has expiratory wheeze to L upper lobe and Rhonchi to R lower lobe.   RN notified Manuela Schwartz, NP of the above information

## 2023-05-30 NOTE — Progress Notes (Signed)
5784 - Code Stroke Activated 0051 - Patient arrived in CT  mRS 2, LWKT 2307 when NA took patient to restroom, ROS at 0045 when RN doing Q4 Neuro assessment found patient to be aphasic 0057 - Tele-Neurologist paged [phone] - Dr. Gerre Pebbles on stroke cart  0110 - NCCT results given to Dr. Gerre Pebbles on camera 0118 - Patient leaving CT

## 2023-05-30 NOTE — Progress Notes (Signed)
RN walked into the patient's room to perform NIH stroke scale. Patient unable to speak or make sounds, this was assessed as a neurological change.  At 2307 the patient was having a conversation with the NT and walked to the bathroom using walker with minimal assistance.  RN called code stroke and notified Manuela Schwartz, NP and Corrie Dandy, RN of the above information.

## 2023-05-30 NOTE — Progress Notes (Signed)
Called to CT for IV start due to code stroke. IV obtained by staff RN upon arrival.

## 2023-05-30 NOTE — Plan of Care (Signed)
  Problem: Education: Goal: Knowledge of disease or condition will improve Outcome: Progressing Goal: Knowledge of secondary prevention will improve (MUST DOCUMENT ALL) Outcome: Progressing Goal: Knowledge of patient specific risk factors will improve (Mark N/A or DELETE if not current risk factor) Outcome: Progressing   Problem: Ischemic Stroke/TIA Tissue Perfusion: Goal: Complications of ischemic stroke/TIA will be minimized Outcome: Progressing   Problem: Coping: Goal: Will verbalize positive feelings about self Outcome: Progressing Goal: Will identify appropriate support needs Outcome: Progressing   Problem: Health Behavior/Discharge Planning: Goal: Ability to manage health-related needs will improve Outcome: Progressing Goal: Goals will be collaboratively established with patient/family Outcome: Progressing   Problem: Self-Care: Goal: Ability to participate in self-care as condition permits will improve Outcome: Progressing Goal: Verbalization of feelings and concerns over difficulty with self-care will improve Outcome: Progressing Goal: Ability to communicate needs accurately will improve Outcome: Progressing   Problem: Nutrition: Goal: Risk of aspiration will decrease Outcome: Progressing Goal: Dietary intake will improve Outcome: Progressing   Problem: Education: Goal: Knowledge of General Education information will improve Description: Including pain rating scale, medication(s)/side effects and non-pharmacologic comfort measures Outcome: Progressing   Problem: Health Behavior/Discharge Planning: Goal: Ability to manage health-related needs will improve Outcome: Progressing   Problem: Clinical Measurements: Goal: Ability to maintain clinical measurements within normal limits will improve Outcome: Progressing Goal: Will remain free from infection Outcome: Progressing Goal: Diagnostic test results will improve Outcome: Progressing Goal: Respiratory  complications will improve Outcome: Progressing Goal: Cardiovascular complication will be avoided Outcome: Progressing   Problem: Activity: Goal: Risk for activity intolerance will decrease Outcome: Progressing   Problem: Nutrition: Goal: Adequate nutrition will be maintained Outcome: Progressing   Problem: Coping: Goal: Level of anxiety will decrease Outcome: Progressing   Problem: Elimination: Goal: Will not experience complications related to bowel motility Outcome: Progressing Goal: Will not experience complications related to urinary retention Outcome: Progressing   Problem: Pain Managment: Goal: General experience of comfort will improve Outcome: Progressing   Problem: Safety: Goal: Ability to remain free from injury will improve Outcome: Progressing   Problem: Skin Integrity: Goal: Risk for impaired skin integrity will decrease Outcome: Progressing   Problem: Education: Goal: Ability to describe self-care measures that may prevent or decrease complications (Diabetes Survival Skills Education) will improve Outcome: Progressing Goal: Individualized Educational Video(s) Outcome: Progressing   Problem: Coping: Goal: Ability to adjust to condition or change in health will improve Outcome: Progressing   Problem: Fluid Volume: Goal: Ability to maintain a balanced intake and output will improve Outcome: Progressing   Problem: Health Behavior/Discharge Planning: Goal: Ability to identify and utilize available resources and services will improve Outcome: Progressing Goal: Ability to manage health-related needs will improve Outcome: Progressing   Problem: Metabolic: Goal: Ability to maintain appropriate glucose levels will improve Outcome: Progressing   Problem: Nutritional: Goal: Maintenance of adequate nutrition will improve Outcome: Progressing Goal: Progress toward achieving an optimal weight will improve Outcome: Progressing   Problem: Skin  Integrity: Goal: Risk for impaired skin integrity will decrease Outcome: Progressing   Problem: Tissue Perfusion: Goal: Adequacy of tissue perfusion will improve Outcome: Progressing   

## 2023-05-30 NOTE — Progress Notes (Addendum)
PROGRESS NOTE    Bradley Morrison  ZOX:[id number] DOB: 1976/07/02 DOA: 05/27/2023 PCP: [facility]   Assessment & Plan:   Principal Problem:   Acute ischemic stroke [facility]) Active Problems:   Dyslipidemia   Type 2 diabetes mellitus with peripheral neuropathy (HCC)   Essential hypertension   Marijuana abuse   CVA (cerebral vascular accident) (HCC)  Assessment and Plan: Acute ischemic CVA: continue on statin, aspirin, plavix as per neuro. Continue w/ neuro checks. Worsening dysarthria overnight. Repeat MRI brain shows bilateral lower pontine abnormal diffusion, now more intense on the left side versus two days ago. Increased T2 hyperintensity with no hemorrhage or mass effect. Echo shows EF 55-60%, grade I diastolic dysfunction & no intraatrial shunts identified. Neuro following and recs apprec   Dysphagia: likely secondary to acute CVA. Continue on dysphagia II diet as per speech    HLD: continue on statin    DM2: HbA1c 6.5, well controlled. Continue on SSI w/ accuchecks    HTN: BP goal around 140/90 & avoid hypotension. Hold amlodipine, losartan   Marijuana abuse: received marijuana cessation counseling already     DVT prophylaxis: lovenox  Code Status: full  Family Communication: discussed pt's care w/ pt's family at bedside and answered their questions  Disposition Plan: possibly d/c to inpatient rehab   Level of care: Telemetry Medical  Status is: Inpatient Remains inpatient appropriate because: severity of illness     Consultants:  Neuro   Procedures:   Antimicrobials:   Subjective: Pt c/o difficulty talking   Objective: Vitals:   05/29/23 2032 05/29/23 2307 05/30/23 0047 05/30/23 0444  BP: 120/78 135/81 (!) 182/107 (!) 138/91  Pulse: 73 68 (!) 112 82  Resp: 16 16  16   Temp: 98.6 F (37 C) (!) 97.4 F (36.3 C)  98.7 F (37.1 C)  TempSrc: Oral Oral    SpO2: 100% 96% 96% 94%  Weight:      Height:        Intake/Output Summary (Last 24  hours) at 05/30/2023 0732 Last data filed at 05/30/2023 0154 Gross per 24 hour  Intake --  Output 150 ml  Net -150 ml   Filed Weights   05/28/23 0009  Weight: 95.9 kg    Examination:  General exam: Appears calm & comfortable Respiratory system: clear breath sounds b/l Cardiovascular system: S1/S2+. No rubs or clicks  Gastrointestinal system: abd is soft, NT, obese & hypoactive bowel sounds  Central nervous system: alert and oriented. Dysarthria  Psychiatry:  judgement and insight appears normal. Flat mood and affect     Data Reviewed: I have personally reviewed following labs and imaging studies  CBC: Recent Labs  Lab 05/28/23 0007 05/30/23 0512  WBC 12.4* 11.4*  NEUTROABS 7.8*  --   HGB 15.2 15.0  HCT 47.3 45.6  MCV 87.4 85.2  PLT 305 294   Basic Metabolic Panel: Recent Labs  Lab 05/28/23 0007 05/30/23 0512  NA 139 136  K 3.9 3.3*  CL 106 104  CO2 24 22  GLUCOSE 138* 100*  BUN 17 19  CREATININE 0.68 0.70  CALCIUM 9.1 9.0   GFR: Estimated Creatinine Clearance: 121 mL/min (by C-G formula based on SCr of 0.7 mg/dL). Liver Function Tests: Recent Labs  Lab 05/28/23 0007  AST 16  ALT 32  ALKPHOS 88  BILITOT 1.0  PROT 8.4*  ALBUMIN 4.2   No results for input(s): "LIPASE", "AMYLASE" in the last 168 hours. No results for input(s): "AMMONIA" in the  last 168 hours. Coagulation Profile: Recent Labs  Lab 05/28/23 0007  INR 1.0   Cardiac Enzymes: No results for input(s): "CKTOTAL", "CKMB", "CKMBINDEX", "TROPONINI" in the last 168 hours. BNP (last 3 results) No results for input(s): "PROBNP" in the last 8760 hours. HbA1C: Recent Labs    05/28/23 1012  HGBA1C 6.5*   CBG: Recent Labs  Lab 05/29/23 0745 05/29/23 1127 05/29/23 1634 05/29/23 2034 05/30/23 0045  GLUCAP 106* 154* 76 187* 80   Lipid Profile: Recent Labs    05/28/23 1012  CHOL 109  HDL 30*  LDLCALC 64  TRIG 77  CHOLHDL 3.6   Thyroid Function Tests: No results for input(s):  "TSH", "T4TOTAL", "FREET4", "T3FREE", "THYROIDAB" in the last 72 hours. Anemia Panel: No results for input(s): "VITAMINB12", "FOLATE", "FERRITIN", "TIBC", "IRON", "RETICCTPCT" in the last 72 hours. Sepsis Labs: No results for input(s): "PROCALCITON", "LATICACIDVEN" in the last 168 hours.  No results found for this or any previous visit (from the past 240 hour(s)).       Radiology Studies: MR BRAIN WO CONTRAST  Result Date: 05/30/2023 CLINICAL DATA:  47 year old male recurrent code stroke presentation. Recent small bilateral brainstem infarcts, by report initially on the right side and 1st diagnosed and treated at [facility] system. EXAM: MRI HEAD WITHOUT CONTRAST TECHNIQUE: Multiplanar, multiecho pulse sequences of the brain and surrounding structures were obtained without intravenous contrast. COMPARISON:  Head CT 0059 hours today. Previous brain MRI 05/28/2023. CTA head and neck 05/28/2023. FINDINGS: Brain: Ongoing restricted diffusion in the lower pons near the pontomedullary junction, bilateral and nearly symmetric on series 9, image 11. Currently the left of midline involvement appears more intense on DWI (series 9, image 11) versus 2 days ago. Corresponding T2 hyperintensity has developed although FLAIR hyperintensity remains fairly mild. No hemorrhage or mass effect. No other diffusion restriction. And no other areas of convincing abnormal diffusion although on coronal imaging mildly abnormal diffusion in both mesial temporal lobes was considered (series 7, image 20). But not correlated on T2 or FLAIR, and isointense on ADC. No superimposed midline shift, mass effect, evidence of mass lesion, ventriculomegaly, extra-axial collection or acute intracranial hemorrhage. Cervicomedullary junction and pituitary are within normal limits. Minimal scattered nonspecific cerebral white matter T2 and FLAIR hyperintensity is stable. Left lentiform involvement again noted. Deep gray nuclei otherwise appear  normal. No cortical encephalomalacia or chronic cerebral blood products. Vascular: Major intracranial vascular flow voids are stable. Dominant appearing distal left vertebral artery. Skull and upper cervical spine: Negative. Visualized bone marrow signal is within normal limits. Sinuses/Orbits: Negative. Other: Visible internal auditory structures appear normal. Multiple scalp sebaceous cysts incidentally noted. IMPRESSION: 1. Bilateral lower pontine abnormal diffusion, now more intense on the left side versus two days ago. Increased T2 hyperintensity with no hemorrhage or mass effect. No new areas of involvement. Small-vessel ischemic etiology is possible. Other etiologies of symmetric brain diffusion abnormality were considered (such as toxic exposure or myelinosis), but seem unlikely given the pattern here. 2. New acute intracranial abnormality. And otherwise mild nonspecific cerebral white matter signal changes. Electronically Signed   By: Odessa Fleming M.D.   On: 05/30/2023 04:29   CT HEAD CODE STROKE WO CONTRAST`  Result Date: 05/30/2023 CLINICAL DATA:  Code stroke.  Acute neurologic deficit EXAM: CT HEAD WITHOUT CONTRAST TECHNIQUE: Contiguous axial images were obtained from the base of the skull through the vertex without intravenous contrast. RADIATION DOSE REDUCTION: This exam was performed according to the departmental dose-optimization program which includes automated exposure  control, adjustment of the mA and/or kV according to patient size and/or use of iterative reconstruction technique. COMPARISON:  05/27/2019 FINDINGS: Brain: There is no mass, hemorrhage or extra-axial collection. The size and configuration of the ventricles and extra-axial CSF spaces are normal. The brain parenchyma is normal, without evidence of acute or chronic infarction. Vascular: No abnormal hyperdensity of the major intracranial arteries or dural venous sinuses. No intracranial atherosclerosis. Skull: The visualized skull base,  calvarium and extracranial soft tissues are normal. Sinuses/Orbits: No fluid levels or advanced mucosal thickening of the visualized paranasal sinuses. No mastoid or middle ear effusion. The orbits are normal. ASPECTS [facility] Stroke Program Early CT Score) - Ganglionic level infarction (caudate, lentiform nuclei, internal capsule, insula, M1-M3 cortex): 7 - Supraganglionic infarction (M4-M6 cortex): 3 Total score (0-10 with 10 being normal): 10 IMPRESSION: 1. No acute intracranial abnormality. 2. ASPECTS is 10. These results were called by telephone at the time of interpretation on 05/30/2023 at 1:03 am to provider Manuela Schwartz, who verbally acknowledged these results. Electronically Signed   By: Deatra Robinson M.D.   On: 05/30/2023 01:04   ECHOCARDIOGRAM COMPLETE BUBBLE STUDY  Result Date: 05/28/2023    ECHOCARDIOGRAM REPORT   Patient Name:   Bradley Morrison Date of Exam: 05/28/2023 Medical Rec #:  [id number]       Height:       66.0 in Accession #:    [id number]      Weight:       211.5 lb Date of Birth:  [birth year]        BSA:          2.048 m Patient Age:    47 years        BP:           164/96 mmHg Patient Gender: M               HR:           74 bpm. Exam Location:  [facility] Procedure: 2D Echo, Cardiac Doppler, Color Doppler and Saline Contrast Bubble            Study Indications:     Stroke 434.91 / I63.9  History:         Patient has no prior history of Echocardiogram examinations.                  Stroke, Signs/Symptoms:Chest Pain; Risk Factors:Hypertension.  Sonographer:     Cristela Blue Referring Phys:  [id number] JAN A MANSY Diagnosing Phys: Debbe Odea MD IMPRESSIONS  1. Left ventricular ejection fraction, by estimation, is 55 to 60%. Left ventricular ejection fraction by PLAX is 55 %. The left ventricle has normal function. The left ventricle has no regional wall motion abnormalities. Left ventricular diastolic parameters are consistent with Grade I diastolic dysfunction (impaired relaxation).  2. Right  ventricular systolic function is normal. The right ventricular size is normal.  3. The mitral valve is normal in structure. No evidence of mitral valve regurgitation.  4. The aortic valve was not well visualized. Aortic valve regurgitation is not visualized.  5. The inferior vena cava is normal in size with greater than 50% respiratory variability, suggesting right atrial pressure of 3 mmHg.  6. Agitated saline contrast bubble study was negative, with no evidence of any interatrial shunt. FINDINGS  Left Ventricle: Left ventricular ejection fraction, by estimation, is 55 to 60%. Left ventricular ejection fraction by PLAX is 55 %. The left ventricle has normal function. The left  ventricle has no regional wall motion abnormalities. The left ventricular internal cavity size was normal in size. There is no left ventricular hypertrophy. Left ventricular diastolic parameters are consistent with Grade I diastolic dysfunction (impaired relaxation). Right Ventricle: The right ventricular size is normal. No increase in right ventricular wall thickness. Right ventricular systolic function is normal. Left Atrium: Left atrial size was normal in size. Right Atrium: Right atrial size was normal in size. Pericardium: There is no evidence of pericardial effusion. Mitral Valve: The mitral valve is normal in structure. No evidence of mitral valve regurgitation. Tricuspid Valve: The tricuspid valve is normal in structure. Tricuspid valve regurgitation is not demonstrated. Aortic Valve: The aortic valve was not well visualized. Aortic valve regurgitation is not visualized. Aortic valve mean gradient measures 4.0 mmHg. Aortic valve peak gradient measures 7.7 mmHg. Aortic valve area, by VTI measures 2.37 cm. Pulmonic Valve: The pulmonic valve was not well visualized. Pulmonic valve regurgitation is not visualized. Aorta: The aortic root is normal in size and structure. Venous: The inferior vena cava is normal in size with greater than 50%  respiratory variability, suggesting right atrial pressure of 3 mmHg. IAS/Shunts: No atrial level shunt detected by color flow Doppler. Agitated saline contrast was given intravenously to evaluate for intracardiac shunting. Agitated saline contrast bubble study was negative, with no evidence of any interatrial shunt.  LEFT VENTRICLE PLAX 2D LV EF:         Left            Diastology                ventricular     LV e' medial:    6.20 cm/s                ejection        LV E/e' medial:  12.5                fraction by     LV e' lateral:   10.20 cm/s                PLAX is 55      LV E/e' lateral: 7.6                %. LVIDd:         4.20 cm LVIDs:         3.00 cm LV PW:         0.90 cm LV IVS:        1.10 cm LVOT diam:     2.10 cm LV SV:         61 LV SV Index:   30 LVOT Area:     3.46 cm  RIGHT VENTRICLE RV Basal diam:  2.60 cm RV Mid diam:    2.50 cm RV S prime:     12.80 cm/s TAPSE (M-mode): 2.6 cm LEFT ATRIUM           Index        RIGHT ATRIUM          Index LA diam:      2.80 cm 1.37 cm/m   RA Area:     9.94 cm LA Vol (A2C): 75.7 ml 36.96 ml/m  RA Volume:   20.70 ml 10.11 ml/m LA Vol (A4C): 22.3 ml 10.89 ml/m  AORTIC VALVE AV Area (Vmax):    2.62 cm AV Area (Vmean):   2.36 cm AV Area (VTI):     2.37 cm AV  Vmax:           139.00 cm/s AV Vmean:          98.000 cm/s AV VTI:            0.259 m AV Peak Grad:      7.7 mmHg AV Mean Grad:      4.0 mmHg LVOT Vmax:         105.00 cm/s LVOT Vmean:        66.900 cm/s LVOT VTI:          0.177 m LVOT/AV VTI ratio: 0.68  AORTA Ao Root diam: 3.20 cm MITRAL VALVE               TRICUSPID VALVE MV Area (PHT): 3.33 cm    TR Peak grad:   11.2 mmHg MV Decel Time: 228 msec    TR Vmax:        167.00 cm/s MV E velocity: 77.60 cm/s MV A velocity: 95.40 cm/s  SHUNTS MV E/A ratio:  0.81        Systemic VTI:  0.18 m                            Systemic Diam: 2.10 cm Debbe Odea MD Electronically signed by Debbe Odea MD Signature Date/Time: 05/28/2023/11:59:33 AM    Final          Scheduled Meds:  amLODipine  10 mg Oral Daily   aspirin EC  81 mg Oral Daily   atorvastatin  80 mg Oral Daily   clopidogrel  75 mg Oral Daily   enoxaparin (LOVENOX) injection  50 mg Subcutaneous Q24H   insulin aspart  0-6 Units Subcutaneous TID WC   losartan  25 mg Oral Daily   Continuous Infusions:     LOS: 2 days    Time spent: 25 mins     [name], MD [facility] Pager [phone]  If 7PM-7AM, please contact night-coverage [email or web address] 05/30/2023, 7:32 AM

## 2023-05-30 NOTE — PMR Pre-admission (Signed)
PMR Admission Coordinator Pre-Admission Assessment  Patient: Bradley Morrison is an 47 y.o., male MRN: [id number] DOB: 1976/04/24 Height: 5\' 6"  (167.6 cm) Weight: 95.9 kg  Insurance Information HMO: yes    PPO:      PCP:      IPA:      80/20:      OTHER:  PRIMARY: [employer]      Policy#: [id number]      Subscriber: patient CM Name: Karin Golden      Phone#: [phone]     Fax#: [phone] Baird Lyons with [employer] called 8/14 with Berkley Harvey 6/06-3/01 Pre-Cert#: [id number]      Employer:  Benefits:  Phone #: n/a-online at [email or web address]     Name:  Eff. Date: 10/19/22     Deduct: $240 ($240 met)      Out of Pocket Max: [phone] ([phone] met)      Life Max: NA CIR: $2,080 co-pay/admission     SNF: 100% coverage for days 1-20, $203/day co-pay for days 21-100 Outpatient: 80% coverage     Co-Pay: 20% co-insurance Home Health: 100% coverage      Co-Pay:  DME: 80% coverage     Co-Pay: 20% co-insurance Providers: in-network SECONDARY: Medicaid [place] [facility]       Policy#: [id number] n     Phone#: [phone]  Financial Counselor:       Phone#:   The "Data Collection Information Summary" for patients in Inpatient Rehabilitation Facilities with attached "Privacy Act Statement-Health Care Records" was provided and verbally reviewed with: Patient  Emergency Contact Information Contact Information     Name Relation Home Work Mobile   Bell,Cheryl Mother [phone]  [phone]   Glory Rosebush [phone]  [phone]      Other Contacts   None on File     Current Medical History  Patient Admitting Diagnosis: CVA History of Present Illness: Pt is a 47 year old male with medical hx significant for: CP, HTN, hyperlipidemia, DM, anxiety, right pontine stroke (week prior). Pt presented [facility] on [facility]27[facility]23 d/t worsening speech deficits and new right-sided facial weakness. CT head showed no acute bleeding, midline shift or acute core infarct. CTA  negative for LVO. Pt not a candidate for tPA. Echo showed EF 55-60%. MRI showed multifocal ventral pontine infarct. Code stroke activated on 8/11. Repeat imaging was negative for acute abnormalities. Therapy evaluations completed and CIR recommended d/t pt's deficits in functional mobility, dysarthria and dysphagia. Complete NIHSS TOTAL: 3  Patient's medical record from [facility] has been reviewed by the rehabilitation admission coordinator and physician.  Past Medical History  Past Medical History:  Diagnosis Date   Anxiety    CP (cerebral palsy) (HCC)    Hypertension    Stroke [facility])     Has the patient had major surgery during 100 days prior to admission? No  Family History   family history is not on file.  Current Medications  Current Facility-Administered Medications:    acetaminophen (TYLENOL) tablet 650 mg, 650 mg, Oral, Q4H PRN, 650 mg at 05/31/23 0058 **OR** acetaminophen (TYLENOL) 160 MG/5ML solution 650 mg, 650 mg, Per Tube, Q4H PRN **OR** acetaminophen (TYLENOL) suppository 650 mg, 650 mg, Rectal, Q4H PRN, Mansy, Jan A, MD   aspirin EC tablet 81 mg, 81 mg, Oral, Daily, Milon Dikes, MD, 81 mg at 05/31/23 0935   atorvastatin (LIPITOR) tablet 80 mg, 80 mg, Oral, Daily, Mansy, Jan A, MD, 80 mg at 05/31/23 0935   clopidogrel (PLAVIX) tablet 75 mg,  75 mg, Oral, Daily, Mansy, Jan A, MD, 75 mg at 05/31/23 0935   docusate sodium (COLACE) capsule 200 mg, 200 mg, Oral, BID, Fabienne Bruns M, MD, 200 mg at 05/31/23 0935   enoxaparin (LOVENOX) injection 50 mg, 50 mg, Subcutaneous, Q24H, Mansy, Jan A, MD, 50 mg at 05/31/23 0936   guaiFENesin-dextromethorphan (ROBITUSSIN DM) 100-10 MG/5ML syrup 5 mL, 5 mL, Oral, Q4H PRN, [name], MD, 5 mL at 05/30/23 2312   insulin aspart (novoLOG) injection 0-6 Units, 0-6 Units, Subcutaneous, TID WC, [name], MD, 1 Units at 05/29/23 1239   lactulose (CHRONULAC) 10 GM/15ML solution 30 g, 30 g, Oral, Daily  PRN, [name], MD, 30 g at 05/31/23 1056   LORazepam (ATIVAN) tablet 0.5 mg, 0.5 mg, Oral, Q4H PRN, Mansy, Jan A, MD, 0.5 mg at 05/31/23 0325   morphine (PF) 2 MG/ML injection 1 mg, 1 mg, Intravenous, Q3H PRN, [name], MD, 1 mg at 05/28/23 0943   [START ON 06/01/2023] polyethylene glycol (MIRALAX / GLYCOLAX) packet 17 g, 17 g, Oral, Daily, Mayford Knife, Jamiese M, MD   senna-docusate (Senokot-S) tablet 1 tablet, 1 tablet, Oral, QHS PRN, Mansy, Vernetta Honey, MD  Patients Current Diet:  Diet Order             DIET DYS 2 Room service appropriate? Yes; Fluid consistency: Thin  Diet effective now                   Precautions / Restrictions Precautions Precautions: Fall Precaution Comments: aspiration Restrictions Weight Bearing Restrictions: No   Has the patient had 2 or more falls or a fall with injury in the past year? No  Prior Activity Level Community (5-7x/wk): drives, gets out of house often  Prior Functional Level Self Care: Did the patient need help bathing, dressing, using the toilet or eating? Independent  Indoor Mobility: Did the patient need assistance with walking from room to room (with or without device)? Independent  Stairs: Did the patient need assistance with internal or external stairs (with or without device)? Independent  Functional Cognition: Did the patient need help planning regular tasks such as shopping or remembering to take medications? Independent  Patient Information Are you of Hispanic, Latino/a,or Spanish origin?: A. No, not of Hispanic, Latino/a, or Spanish origin What is your race?: A. White Do you need or want an interpreter to communicate with a doctor or health care staff?: 0. No  Patient's Response To:  Health Literacy and Transportation Is the patient able to respond to health literacy and transportation needs?: Yes Health Literacy - How often do you need to have someone help you when you read instructions, pamphlets, or  other written material from your doctor or pharmacy?: Never In the past 12 months, has lack of transportation kept you from medical appointments or from getting medications?: No In the past 12 months, has lack of transportation kept you from meetings, work, or from getting things needed for daily living?: No  Home Assistive Devices / Equipment Home Assistive Devices/Equipment: Environmental consultant (specify type), Eyeglasses Home Equipment: Rolling Walker (2 wheels)  Prior Device Use: Indicate devices/aids used by the patient prior to current illness, exacerbation or injury? None of the above  Current Functional Level Cognition  Overall Cognitive Status: Within Functional Limits for tasks assessed Orientation Level: Oriented X4 General Comments: very motivated, VC for sequencing to support fluids intake while minimizing aspiration risk    Extremity Assessment (includes Sensation/Coordination)  Upper Extremity Assessment: Overall [facility]  for tasks assessed, LUE deficits/detail, RUE deficits/detail RUE Coordination: decreased fine motor  Lower Extremity Assessment: LLE deficits/detail, RLE deficits/detail, Defer to PT evaluation (+ clonus LLE) RLE Deficits / Details: MMT gross 4-5/5 RLE Sensation: WNL LLE Deficits / Details: L hip flexion 3/5, L knee ext 3+/5 LLE Sensation: WNL LLE Coordination: decreased gross motor (Unable to maintain alternate heel tapping with increased speed)    ADLs  Overall ADL's : Needs assistance/impaired Eating/Feeding: Set up, Supervision/ safety, Cueing for safety, Cueing for sequencing, Cueing for compensatory techinques, Sitting Eating/Feeding Details (indicate cue type and reason): OT facilitated carryover and training in use of chin tuck for thin liquids (orders in place) with use of cup to chest and straw. Pt required supervision and intermittent VC for sequencing to ensure chin tuck. Educated pt/family in minimizing distractions and multitasking. RN provided lactuose  (thicker) and pt demonstrated more difficulty with this, twice coughing to clear secretions. Chin tuck was utilized both times and pt able to clear secretions with coughing afterwards. Family member provided excellent cues for pt to support carryover of learned techniques. No difficulty holding cup with R or L hand. Grooming: Brushing hair, Sitting Grooming Details (indicate cue type and reason): with mirror assist, pt able to brush hair incorporating both hands into task to brush out tangles and pull hair back into ponytail. Increased difficulty and time to complete, decr coordination when leading with R hand but no direct assist required. Lower Body Bathing: Sit to/from stand, Minimal assistance Upper Body Dressing : Minimal assistance, Sitting Lower Body Dressing: Moderate assistance Lower Body Dressing Details (indicate cue type and reason): donn/doff socks, shorts seated on edge of bed Toilet Transfer: Rolling walker (2 wheels), Ambulation, Contact guard assist, Regular Toilet Toileting- Clothing Manipulation and Hygiene: Minimal assistance, Sit to/from stand Toileting - Clothing Manipulation Details (indicate cue type and reason): Min A for thoroughness in doffing shorts to ensure shorts did not get caught on the edge of the toilet bowl prior to sitting. Pt requesting additional time to complete BM. Provided with call bell. Family member in room. RN notified. Functional mobility during ADLs: Minimal assistance, Moderate assistance, Rolling walker (2 wheels)    Mobility  Overal bed mobility: Needs Assistance Bed Mobility: Supine to Sit Supine to sit: HOB elevated, Supervision General bed mobility comments: NT    Transfers  Overall transfer level: Needs assistance Equipment used: Rolling walker (2 wheels) Transfers: Sit to/from Stand Sit to Stand: Contact guard assist, Min assist General transfer comment: Requires extra time to achieve full upright standing and maintain balance     Ambulation / Gait / Stairs / Wheelchair Mobility  Ambulation/Gait Ambulation/Gait assistance: Mod assist Gait Distance (Feet): 200 Feet (160+40) Assistive device: Rolling walker (2 wheels), None Gait Pattern/deviations: Step-through pattern, Decreased step length - left, Decreased stance time - left, Shuffle, Knee hyperextension - left General Gait Details: Multiple standing breaks needed to regain balance, difficulty with turns (LOB requiring mod-maxA from PT to recover), improved clonus during functional activities, B/L lateral lean to assist with weight shifting/ foot clearance Gait velocity: decreased Stairs: Yes Stairs assistance: Min assist, Mod assist Stair Management: Two rails, Step to pattern Number of Stairs: 4 (6") General stair comments: PT provides demonstration & education re: compensatory pattern. Pt ascends stairs with B rails & min assist, min<>mod assist to safely descend stairs.    Posture / Balance Balance Overall balance assessment: Needs assistance Sitting-balance support: Feet supported, No upper extremity supported Sitting balance-Leahy Scale: Good Standing balance support: Bilateral  upper extremity supported, During functional activity, Reliant on assistive device for balance Standing balance-Leahy Scale: Fair Standing balance comment: Pt required use of bathroom promptly, RW utilized to maximize balance for task    Special needs/care consideration Diabetic management Novolog 0-6 units  3x daily with meals   Previous Home Environment (from acute therapy documentation) Living Arrangements: Alone  Lives With: Alone Available Help at Discharge: Family, Available 24 hours/day Type of Home: Other(Comment) (camper in a friend's yard) Home Layout: One level Home Access: Stairs to enter Entrance Stairs-Rails: None Secretary/administrator of Steps: 3 Bathroom Shower/Tub: None Firefighter:  (none in camper.) Bathroom Accessibility:  (NA- no bathroom in  camper) Home Care Services: No  Discharge Living Setting Plans for Discharge Living Setting: House (mother's house) Type of Home at Discharge: House Discharge Home Layout: One level (has a basement) Discharge Home Access: Stairs to enter Entrance Stairs-Rails: Left Entrance Stairs-Number of Steps: 5 Discharge Bathroom Shower/Tub: Tub/shower unit Discharge Bathroom Toilet: Handicapped height Discharge Bathroom Accessibility: No Does the patient have any problems obtaining your medications?: No  Social/Family/Support Systems Anticipated Caregiver: Eino Farber, mother Anticipated Caregiver's Contact Information: [phone] Caregiver Availability: 24/7 Discharge Plan Discussed with Primary Caregiver: Yes Is Caregiver In Agreement with Plan?: Yes Does Caregiver/Family have Issues with Lodging/Transportation while Pt is in Rehab?: No  Goals Patient/Family Goal for Rehab: Supervision:PT/OT,  Min A:ST Expected length of stay: 12-14 dyas Pt/Family Agrees to Admission and willing to participate: Yes Program Orientation Provided & Reviewed with Pt/Caregiver Including Roles  & Responsibilities: Yes  Decrease burden of Care through IP rehab admission: NA  Possible need for SNF placement upon discharge: Not anticipated  Patient Condition: I have reviewed medical records from [facility], spoken with CSW, and patient and family member. I discussed via phone for inpatient rehabilitation assessment.  Patient will benefit from ongoing PT, OT, and SLP, can actively participate in 3 hours of therapy a day 5 days of the week, and can make measurable gains during the admission.  Patient will also benefit from the coordinated team approach during an Inpatient Acute Rehabilitation admission.  The patient will receive intensive therapy as well as Rehabilitation physician, nursing, social worker, and care management interventions.  Due to safety, disease management, medication  administration, pain management, and patient education the patient requires 24 hour a day rehabilitation nursing.  The patient is currently min-mod A with mobility and basic ADLs.  Discharge setting and therapy post discharge at home with home health is anticipated.  Patient has agreed to participate in the Acute Inpatient Rehabilitation Program and will admit today.  Preadmission Screen Completed By:  Domingo Pulse, 05/31/2023 1:32 PM ______________________________________________________________________   Discussed status with Dr. Carlis Abbott  on 06/02/23 930 and received approval for admission today.  Admission Coordinator:  Domingo Pulse, CCC-SLP, time 1110/Date 06/02/23   Assessment/Plan: Diagnosis: Acute ischemic stroke Does the need for close, 24 hr/day Medical supervision in concert with the patient's rehab needs make it unreasonable for this patient to be served in a less intensive setting? Yes Co-Morbidities requiring supervision/potential complications: obesity, hypertension, dyslipidemia, type 2 diabetes, marijuana abuse, OSA Due to bladder management, bowel management, safety, skin/wound care, disease management, medication administration, pain management, and patient education, does the patient require 24 hr/day rehab nursing? Yes Does the patient require coordinated care of a physician, rehab nurse, PT, OT, and SLP to address physical and functional deficits in the context of the above medical diagnosis(es)? Yes Addressing deficits in  the following areas: balance, endurance, locomotion, strength, transferring, bowel/bladder control, bathing, dressing, feeding, grooming, toileting, cognition, and psychosocial support Can the patient actively participate in an intensive therapy program of at least 3 hrs of therapy 5 days a week? Yes The potential for patient to make measurable gains while on inpatient rehab is excellent Anticipated functional outcomes upon discharge from  inpatient rehab: supervision PT, supervision OT, supervision SLP Estimated rehab length of stay to reach the above functional goals is: 10-12 days Anticipated discharge destination: Home 10. Overall Rehab/Functional Prognosis: excellent   MD Signature: Sula Soda, MD

## 2023-05-31 ENCOUNTER — Inpatient Hospital Stay: Payer: Medicare HMO

## 2023-05-31 DIAGNOSIS — I639 Cerebral infarction, unspecified: Secondary | ICD-10-CM | POA: Diagnosis not present

## 2023-05-31 LAB — GLUCOSE, CAPILLARY
Glucose-Capillary: 149 mg/dL — ABNORMAL HIGH (ref 70–99)
Glucose-Capillary: 119 mg/dL — ABNORMAL HIGH (ref 70–99)
Glucose-Capillary: 96 mg/dL (ref 70–99)
Glucose-Capillary: 156 mg/dL — ABNORMAL HIGH (ref 70–99)

## 2023-05-31 LAB — PROTEIN S ACTIVITY: Protein S Activity: 68 % (ref 63–140)

## 2023-05-31 LAB — PROTEIN C, TOTAL: Protein C, Total: 116 % (ref 60–150)

## 2023-05-31 LAB — PROTEIN C ACTIVITY: Protein C Activity: 119 % (ref 73–180)

## 2023-05-31 LAB — LUPUS ANTICOAGULANT PANEL
DRVVT: 34.8 s (ref 0.0–47.0)
PTT Lupus Anticoagulant: 39 s (ref 0.0–43.5)

## 2023-05-31 LAB — PROTEIN S, TOTAL: Protein S Ag, Total: 87 % (ref 60–150)

## 2023-05-31 MED ORDER — DAPAGLIFLOZIN PROPANEDIOL 10 MG PO TABS
10.0000 mg | ORAL_TABLET | Freq: Every day | ORAL | Status: DC
  Administered 2023-05-31 – 2023-06-02 (×3): 10 mg via ORAL
  Filled 2023-05-31 (×3): qty 1

## 2023-05-31 MED ORDER — GABAPENTIN 300 MG PO CAPS
300.0000 mg | ORAL_CAPSULE | Freq: Three times a day (TID) | ORAL | Status: DC
  Administered 2023-05-31 – 2023-06-02 (×6): 300 mg via ORAL
  Filled 2023-05-31 (×6): qty 1

## 2023-05-31 MED ORDER — LACTULOSE 10 GM/15ML PO SOLN
30.0000 g | Freq: Every day | ORAL | Status: DC | PRN
  Administered 2023-05-31: 30 g via ORAL
  Filled 2023-05-31: qty 60

## 2023-05-31 MED ORDER — POLYETHYLENE GLYCOL 3350 17 G PO PACK: 17.0000 g | PACK | Freq: Every day | ORAL | Status: DC

## 2023-05-31 MED ORDER — POLYETHYLENE GLYCOL 3350 17 G PO PACK
17.0000 g | PACK | Freq: Every day | ORAL | Status: DC
  Administered 2023-06-02: 17 g via ORAL
  Filled 2023-05-31 (×2): qty 1

## 2023-05-31 MED ORDER — LORAZEPAM 0.5 MG PO TABS
0.5000 mg | ORAL_TABLET | ORAL | Status: DC | PRN
  Administered 2023-05-31 – 2023-06-01 (×3): 0.5 mg via ORAL
  Filled 2023-05-31 (×3): qty 1

## 2023-05-31 NOTE — Progress Notes (Signed)
Occupational Therapy Treatment Patient Details Name: Bradley Morrison MRN: [id number] DOB: 1976-08-04 Today's Date: 05/31/2023   History of present illness Pt is a 47 y/o M admitted on 05/27/23 after presenting with c/o acute onset of worsening dysarthria & R facial droop. Pt recently admitted to [facility] & found to have R pontine stroke. During this admission pt found to have acute & subacute pontine infarction (expansion of stroke that initially happened <2 weeks ago, initially involving R pons but now involving L pons as well). Overnight on 8/10 pt with worsening symptoms & MRI showed more prominent areas in B pons compared to prior imaging from a day ago. PMH: hypertension, type 2 diabetes mellitus, anxiety, Cerebral palsy   OT comments  Pt seen for OT Tx focused on grooming tasks, toileting, and drinking. Pt with orders for thin liquids using chin tucks to minimize aspiration risk. OT facilitated carryover and training in use of chin tuck for thin liquids with use of cup to chest with straw. Pt required supervision and intermittent VC for sequencing to ensure chin tuck. Educated pt/family in minimizing distractions and multitasking. RN provided lactulose (thicker) and pt demonstrated more difficulty with this, twice coughing to clear secretions. Chin tuck was utilized both times and pt able to clear secretions with coughing afterwards. Family member provided excellent cues for pt to support carryover of learned techniques. No difficulty holding cup with R or L hand. Pt brushed his hair with setup and increased time/effort to complete to brush and then put up into ponytail. Pt endorsing urgent need to use the bathroom for BM. CGA-MIN A for STS transfer from recliner with RW to maximize balance/safety due to urgent nature of need. CGA to ambulate into bathroom and transfer to standard height toilet. MIN A for thoroughness in clothing mgt to ensure shorts didn't get stuck on toilet bowl. Pt left on toilet for BM  with call bell in hand. Family member in room supportive throughout. RN notified of pt's status at end of session. Pt continues to demonstrate progress towards goals this date. Continue to recommend high intensity skilled OT services.       If plan is discharge home, recommend the following:  A little help with walking and/or transfers;A little help with bathing/dressing/bathroom;Help with stairs or ramp for entrance;Direct supervision/assist for medications management;Assist for transportation;Assistance with cooking/housework   Equipment Recommendations       Recommendations for Other Services      Precautions / Restrictions Precautions Precautions: Fall Precaution Comments: aspiration Restrictions Weight Bearing Restrictions: No       Mobility Bed Mobility               General bed mobility comments: NT    Transfers Overall transfer level: Needs assistance Equipment used: Rolling walker (2 wheels) Transfers: Sit to/from Stand Sit to Stand: Contact guard assist, Min assist                 Balance Overall balance assessment: Needs assistance Sitting-balance support: Feet supported, No upper extremity supported Sitting balance-Leahy Scale: Good     Standing balance support: Bilateral upper extremity supported, During functional activity, Reliant on assistive device for balance Standing balance-Leahy Scale: Fair Standing balance comment: Pt required use of bathroom promptly, RW utilized to maximize balance for task                           ADL either performed or assessed with clinical judgement  ADL Overall ADL's : Needs assistance/impaired Eating/Feeding: Set up;Supervision/ safety;Cueing for safety;Cueing for sequencing;Cueing for compensatory techinques;Sitting Eating/Feeding Details (indicate cue type and reason): OT facilitated carryover and training in use of chin tuck for thin liquids (orders in place) with use of cup to chest and straw.  Pt required supervision and intermittent VC for sequencing to ensure chin tuck. Educated pt/family in minimizing distractions and multitasking. RN provided lactuose (thicker) and pt demonstrated more difficulty with this, twice coughing to clear secretions. Chin tuck was utilized both times and pt able to clear secretions with coughing afterwards. Family member provided excellent cues for pt to support carryover of learned techniques. No difficulty holding cup with R or L hand. Grooming: Brushing hair;Sitting Grooming Details (indicate cue type and reason): with mirror assist, pt able to brush hair incorporating both hands into task to brush out tangles and pull hair back into ponytail. Increased difficulty and time to complete, decr coordination when leading with R hand but no direct assist required.                 Toilet Transfer: Rolling walker (2 wheels);Ambulation;Contact guard assist;Regular Social worker and Hygiene: Minimal assistance;Sit to/from stand Toileting - Clothing Manipulation Details (indicate cue type and reason): Min A for thoroughness in doffing shorts to ensure shorts did not get caught on the edge of the toilet bowl prior to sitting. Pt requesting additional time to complete BM. Provided with call bell. Family member in room. RN notified.            Extremity/Trunk Assessment              Vision       Perception     Praxis      Cognition Arousal: Alert Behavior During Therapy: WFL for tasks assessed/performed Overall Cognitive Status: Within Functional Limits for tasks assessed                                 General Comments: very motivated, VC for sequencing to support fluids intake while minimizing aspiration risk        Exercises      Shoulder Instructions       General Comments B/L clonus present with ankle DF (L sustained, R ~5beat), L knee flexion modified ashworth: 1    Pertinent Vitals/  Pain       Pain Assessment Pain Assessment: No/denies pain  Home Living                                          Prior Functioning/Environment              Frequency  Min 1X/week        Progress Toward Goals  OT Goals(current goals can now be found in the care plan section)  Progress towards OT goals: Progressing toward goals  Acute Rehab OT Goals Patient Stated Goal: return to PLOF OT Goal Formulation: With patient/family Time For Goal Achievement: 06/11/23 Potential to Achieve Goals: Good  Plan      Co-evaluation                 AM-PAC OT "6 Clicks" Daily Activity     Outcome Measure   Help from another person eating meals?: A Little Help from another person taking care of  personal grooming?: None Help from another person toileting, which includes using toliet, bedpan, or urinal?: A Little Help from another person bathing (including washing, rinsing, drying)?: A Little Help from another person to put on and taking off regular upper body clothing?: A Little Help from another person to put on and taking off regular lower body clothing?: A Lot 6 Click Score: 18    End of Session Equipment Utilized During Treatment: Rolling walker (2 wheels)  OT Visit Diagnosis: Unsteadiness on feet (R26.81);Other abnormalities of gait and mobility (R26.89);Muscle weakness (generalized) (M62.81);Other symptoms and signs involving the nervous system (R29.898)   Activity Tolerance Patient tolerated treatment well   Patient Left Other (comment) (seated on toilet, call bell in reach, family member in room.)   Nurse Communication Mobility status;Other (comment) (seated in bathroom wiht call bell, having BM)        Time: 4696-2952 OT Time Calculation (min): 39 min  Charges: OT General Charges $OT Visit: 1 Visit OT Treatments $Self Care/Home Management : 38-52 mins  Arman Filter., MPH, MS, OTR/L ascom [phone] 05/31/23, 11:46 AM

## 2023-05-31 NOTE — Plan of Care (Signed)
  Problem: Education: Goal: Knowledge of disease or condition will improve Outcome: Progressing Goal: Knowledge of secondary prevention will improve (MUST DOCUMENT ALL) Outcome: Progressing Goal: Knowledge of patient specific risk factors will improve (Mark N/A or DELETE if not current risk factor) Outcome: Progressing   Problem: Ischemic Stroke/TIA Tissue Perfusion: Goal: Complications of ischemic stroke/TIA will be minimized Outcome: Progressing   Problem: Coping: Goal: Will verbalize positive feelings about self Outcome: Progressing Goal: Will identify appropriate support needs Outcome: Progressing   Problem: Health Behavior/Discharge Planning: Goal: Ability to manage health-related needs will improve Outcome: Progressing Goal: Goals will be collaboratively established with patient/family Outcome: Progressing   Problem: Self-Care: Goal: Ability to participate in self-care as condition permits will improve Outcome: Progressing Goal: Verbalization of feelings and concerns over difficulty with self-care will improve Outcome: Progressing Goal: Ability to communicate needs accurately will improve Outcome: Progressing   Problem: Nutrition: Goal: Risk of aspiration will decrease Outcome: Progressing Goal: Dietary intake will improve Outcome: Progressing   Problem: Education: Goal: Knowledge of General Education information will improve Description: Including pain rating scale, medication(s)/side effects and non-pharmacologic comfort measures Outcome: Progressing   Problem: Health Behavior/Discharge Planning: Goal: Ability to manage health-related needs will improve Outcome: Progressing   Problem: Clinical Measurements: Goal: Ability to maintain clinical measurements within normal limits will improve Outcome: Progressing Goal: Will remain free from infection Outcome: Progressing Goal: Diagnostic test results will improve Outcome: Progressing Goal: Respiratory  complications will improve Outcome: Progressing Goal: Cardiovascular complication will be avoided Outcome: Progressing   Problem: Activity: Goal: Risk for activity intolerance will decrease Outcome: Progressing   Problem: Nutrition: Goal: Adequate nutrition will be maintained Outcome: Progressing   Problem: Coping: Goal: Level of anxiety will decrease Outcome: Progressing   Problem: Elimination: Goal: Will not experience complications related to bowel motility Outcome: Progressing Goal: Will not experience complications related to urinary retention Outcome: Progressing   Problem: Pain Managment: Goal: General experience of comfort will improve Outcome: Progressing   Problem: Safety: Goal: Ability to remain free from injury will improve Outcome: Progressing   Problem: Skin Integrity: Goal: Risk for impaired skin integrity will decrease Outcome: Progressing   Problem: Education: Goal: Ability to describe self-care measures that may prevent or decrease complications (Diabetes Survival Skills Education) will improve Outcome: Progressing Goal: Individualized Educational Video(s) Outcome: Progressing   Problem: Coping: Goal: Ability to adjust to condition or change in health will improve Outcome: Progressing   Problem: Fluid Volume: Goal: Ability to maintain a balanced intake and output will improve Outcome: Progressing   Problem: Health Behavior/Discharge Planning: Goal: Ability to identify and utilize available resources and services will improve Outcome: Progressing Goal: Ability to manage health-related needs will improve Outcome: Progressing   Problem: Metabolic: Goal: Ability to maintain appropriate glucose levels will improve Outcome: Progressing   Problem: Nutritional: Goal: Maintenance of adequate nutrition will improve Outcome: Progressing Goal: Progress toward achieving an optimal weight will improve Outcome: Progressing   Problem: Skin  Integrity: Goal: Risk for impaired skin integrity will decrease Outcome: Progressing   Problem: Tissue Perfusion: Goal: Adequacy of tissue perfusion will improve Outcome: Progressing   

## 2023-05-31 NOTE — Procedures (Signed)
Modified Barium Swallow Study  Patient Details  Name: Bradley Morrison MRN: [id number] Date of Birth: [birth year]  Today's Date: 05/31/2023  Modified Barium Swallow completed.  Full report located under Chart Review in the Imaging Section.  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  Per MD admit note, "apparently this evening around 8 PM, he had worsened speech, mainly increased slurred speech. He also has Right facial weakness. Unclear if his left side is weaker than previously. Apparently symptoms resolved, he went to bed at 9 PM and woke up later with recurrent similar symptoms with the Right facial weakness, altered speech and difficulty swallowing.   On examination, patient has moderate to severe Dysarthria which is definitely worse than previously according to family as well as new right facial weakness. He does have significant left leg weakness, unclear if this is worse than previously. He also has diminished in station of the left limbs. NIHSS = 7. Most recent MRI brain, 8/11, "1. Bilateral lower pontine abnormal diffusion, now more intense on  the left side versus two days ago. Increased T2 hyperintensity with  no hemorrhage or mass effect.  No new areas of involvement.  Small-vessel ischemic etiology is possible. Other etiologies of  symmetric brain diffusion abnormality were considered (such as toxic  exposure or myelinosis), but seem unlikely given the pattern here."   Clinical Impression Pt presents with a mild oral dysphagia. Pt with delayed swallow initiation and mistimed/reduced laryngeal vesibule closure which attributed to before the swallow penetration and  aspiration of thin liquids. Aspiration was inconsistently audible due to reduced laryngeal sensation. Oral phase notable for prolonged mastication and delayed lingual motion due to lingual weakness/incoorindation. Chin tuck was effective in eliminating aspiration, but not penetration, with thin liquids via cup and straw. Penetration with a chin thuck occured before/during the swallow, transient in nature, and cleared with laryngeal vestibule closure. Recommend a Dysphagia 2 Diet with Thin Liquids with safe swallowing strategies/aspiration precautions including, but not limited to, chin tuck with all liquid intake. SLP to f/u per POC for diet tolerance, dysphagia management, and dysarthria tx.  Pt educated immediately following MBSS re: diet recommendations, safe swallowing strategies/aspiration precautions, rationale for diet recommendations/safe swallowing strategies, and SLP POC. Pt shown videofluoroscopic images for reinforcement of content.  Factors that may increase risk of adverse event in presence of aspiration Bradley Morrison): Aspiration of thick, dense, and/or acidic materials (recent stroke)  Swallow Evaluation Recommendations Recommendations: PO diet PO Diet Recommendation: Dysphagia 2 (Finely chopped);Thin liquids (Level 0) Liquid Administration via: Spoon;Cup;Straw Medication Administration: Crushed with puree Supervision: Patient able to self-feed;Intermittent supervision/cueing for swallowing strategies Swallowing strategies  : Slow rate;Small bites/sips;Chin tuck Postural changes: Position pt fully upright for meals;Stay upright 30-60 min after meals;Out of bed for meals Oral care recommendations: Oral care BID (2x/day);Pt independent with oral care    Bradley Morrison, M.S., Bradley Morrison Speech-Language Pathologist [facility] [phone] (ASCOM)   Bradley Morrison 05/31/2023,9:18 AM

## 2023-05-31 NOTE — Plan of Care (Signed)
Problem: Education: Goal: Knowledge of disease or condition will improve 05/31/2023 0521 by Kristeen Mans, RN Outcome: Progressing 05/31/2023 0521 by Kristeen Mans, RN Outcome: Progressing Goal: Knowledge of secondary prevention will improve (MUST DOCUMENT ALL) 05/31/2023 0521 by Kristeen Mans, RN Outcome: Progressing 05/31/2023 0521 by Kristeen Mans, RN Outcome: Progressing Goal: Knowledge of patient specific risk factors will improve [name] N/A or DELETE if not current risk factor) 05/31/2023 0521 by Kristeen Mans, RN Outcome: Progressing 05/31/2023 0521 by Kristeen Mans, RN Outcome: Progressing   Problem: Ischemic Stroke/TIA Tissue Perfusion: Goal: Complications of ischemic stroke/TIA will be minimized 05/31/2023 0521 by Kristeen Mans, RN Outcome: Progressing 05/31/2023 0521 by Kristeen Mans, RN Outcome: Progressing   Problem: Coping: Goal: Will verbalize positive feelings about self 05/31/2023 0521 by Kristeen Mans, RN Outcome: Progressing 05/31/2023 0521 by Kristeen Mans, RN Outcome: Progressing Goal: Will identify appropriate support needs 05/31/2023 0521 by Kristeen Mans, RN Outcome: Progressing 05/31/2023 0521 by Kristeen Mans, RN Outcome: Progressing   Problem: Health Behavior/Discharge Planning: Goal: Ability to manage health-related needs will improve 05/31/2023 0521 by Kristeen Mans, RN Outcome: Progressing 05/31/2023 0521 by Kristeen Mans, RN Outcome: Progressing Goal: Goals will be collaboratively established with patient/family 05/31/2023 0521 by Kristeen Mans, RN Outcome: Progressing 05/31/2023 0521 by Kristeen Mans, RN Outcome: Progressing   Problem: Self-Care: Goal: Ability to participate in self-care as condition permits will improve 05/31/2023 0521 by Kristeen Mans, RN Outcome: Progressing 05/31/2023 0521 by Kristeen Mans, RN Outcome:  Progressing Goal: Verbalization of feelings and concerns over difficulty with self-care will improve 05/31/2023 0521 by Kristeen Mans, RN Outcome: Progressing 05/31/2023 0521 by Kristeen Mans, RN Outcome: Progressing Goal: Ability to communicate needs accurately will improve 05/31/2023 0521 by Kristeen Mans, RN Outcome: Progressing 05/31/2023 0521 by Kristeen Mans, RN Outcome: Progressing   Problem: Nutrition: Goal: Risk of aspiration will decrease 05/31/2023 0521 by Kristeen Mans, RN Outcome: Progressing 05/31/2023 0521 by Kristeen Mans, RN Outcome: Progressing Goal: Dietary intake will improve 05/31/2023 0521 by Kristeen Mans, RN Outcome: Progressing 05/31/2023 0521 by Kristeen Mans, RN Outcome: Progressing   Problem: Education: Goal: Knowledge of General Education information will improve Description: Including pain rating scale, medication(s)/side effects and non-pharmacologic comfort measures 05/31/2023 0521 by Kristeen Mans, RN Outcome: Progressing 05/31/2023 0521 by Kristeen Mans, RN Outcome: Progressing   Problem: Health Behavior/Discharge Planning: Goal: Ability to manage health-related needs will improve 05/31/2023 0521 by Kristeen Mans, RN Outcome: Progressing 05/31/2023 0521 by Kristeen Mans, RN Outcome: Progressing   Problem: Clinical Measurements: Goal: Ability to maintain clinical measurements within normal limits will improve 05/31/2023 0521 by Kristeen Mans, RN Outcome: Progressing 05/31/2023 0521 by Kristeen Mans, RN Outcome: Progressing Goal: Will remain free from infection 05/31/2023 0521 by Kristeen Mans, RN Outcome: Progressing 05/31/2023 0521 by Kristeen Mans, RN Outcome: Progressing Goal: Diagnostic test results will improve 05/31/2023 0521 by Kristeen Mans, RN Outcome: Progressing 05/31/2023 0521 by Kristeen Mans, RN Outcome:  Progressing Goal: Respiratory complications will improve 05/31/2023 0521 by Kristeen Mans, RN Outcome: Progressing 05/31/2023 0521 by Kristeen Mans, RN Outcome: Progressing Goal: Cardiovascular complication will be avoided 05/31/2023 0521 by Kristeen Mans, RN Outcome: Progressing 05/31/2023 0521 by Kristeen Mans, RN Outcome: Progressing   Problem: Activity: Goal: Risk for activity intolerance will decrease 05/31/2023 0521 by Kristeen Mans, RN Outcome: Progressing  05/31/2023 0521 by Kristeen Mans, RN Outcome: Progressing   Problem: Nutrition: Goal: Adequate nutrition will be maintained 05/31/2023 0521 by Kristeen Mans, RN Outcome: Progressing 05/31/2023 0521 by Kristeen Mans, RN Outcome: Progressing   Problem: Coping: Goal: Level of anxiety will decrease 05/31/2023 0521 by Kristeen Mans, RN Outcome: Progressing 05/31/2023 0521 by Kristeen Mans, RN Outcome: Progressing   Problem: Elimination: Goal: Will not experience complications related to bowel motility 05/31/2023 0521 by Kristeen Mans, RN Outcome: Progressing 05/31/2023 0521 by Kristeen Mans, RN Outcome: Progressing Goal: Will not experience complications related to urinary retention 05/31/2023 0521 by Kristeen Mans, RN Outcome: Progressing 05/31/2023 0521 by Kristeen Mans, RN Outcome: Progressing   Problem: Pain Managment: Goal: General experience of comfort will improve 05/31/2023 0521 by Kristeen Mans, RN Outcome: Progressing 05/31/2023 0521 by Kristeen Mans, RN Outcome: Progressing   Problem: Safety: Goal: Ability to remain free from injury will improve 05/31/2023 0521 by Kristeen Mans, RN Outcome: Progressing 05/31/2023 0521 by Kristeen Mans, RN Outcome: Progressing   Problem: Skin Integrity: Goal: Risk for impaired skin integrity will decrease 05/31/2023 0521 by Kristeen Mans,  RN Outcome: Progressing 05/31/2023 0521 by Kristeen Mans, RN Outcome: Progressing   Problem: Education: Goal: Ability to describe self-care measures that may prevent or decrease complications (Diabetes Survival Skills Education) will improve 05/31/2023 0521 by Kristeen Mans, RN Outcome: Progressing 05/31/2023 0521 by Kristeen Mans, RN Outcome: Progressing Goal: Individualized Educational Video(s) 05/31/2023 0521 by Kristeen Mans, RN Outcome: Progressing 05/31/2023 0521 by Kristeen Mans, RN Outcome: Progressing   Problem: Coping: Goal: Ability to adjust to condition or change in health will improve 05/31/2023 0521 by Kristeen Mans, RN Outcome: Progressing 05/31/2023 0521 by Kristeen Mans, RN Outcome: Progressing   Problem: Fluid Volume: Goal: Ability to maintain a balanced intake and output will improve 05/31/2023 0521 by Kristeen Mans, RN Outcome: Progressing 05/31/2023 0521 by Kristeen Mans, RN Outcome: Progressing   Problem: Health Behavior/Discharge Planning: Goal: Ability to identify and utilize available resources and services will improve 05/31/2023 0521 by Kristeen Mans, RN Outcome: Progressing 05/31/2023 0521 by Kristeen Mans, RN Outcome: Progressing Goal: Ability to manage health-related needs will improve 05/31/2023 0521 by Kristeen Mans, RN Outcome: Progressing 05/31/2023 0521 by Kristeen Mans, RN Outcome: Progressing   Problem: Metabolic: Goal: Ability to maintain appropriate glucose levels will improve 05/31/2023 0521 by Kristeen Mans, RN Outcome: Progressing 05/31/2023 0521 by Kristeen Mans, RN Outcome: Progressing   Problem: Nutritional: Goal: Maintenance of adequate nutrition will improve 05/31/2023 0521 by Kristeen Mans, RN Outcome: Progressing 05/31/2023 0521 by Kristeen Mans, RN Outcome: Progressing Goal: Progress toward achieving an optimal  weight will improve 05/31/2023 0521 by Kristeen Mans, RN Outcome: Progressing 05/31/2023 0521 by Kristeen Mans, RN Outcome: Progressing   Problem: Skin Integrity: Goal: Risk for impaired skin integrity will decrease 05/31/2023 0521 by Kristeen Mans, RN Outcome: Progressing 05/31/2023 0521 by Kristeen Mans, RN Outcome: Progressing   Problem: Tissue Perfusion: Goal: Adequacy of tissue perfusion will improve 05/31/2023 0521 by Kristeen Mans, RN Outcome: Progressing 05/31/2023 0521 by Kristeen Mans, RN Outcome: Progressing

## 2023-05-31 NOTE — Progress Notes (Addendum)
Physical Therapy Treatment Patient Details Name: Bradley Morrison MRN: [id number] DOB: 1976-08-27 Today's Date: 05/31/2023   History of Present Illness Pt is a 47 y/o M admitted on 05/27/23 after presenting with c/o acute onset of worsening dysarthria & R facial droop. Pt recently admitted to [facility] & found to have R pontine stroke. During this admission pt found to have acute & subacute pontine infarction (expansion of stroke that initially happened <2 weeks ago, initially involving R pons but now involving L pons as well). Overnight on 8/10 pt with worsening symptoms & MRI showed more prominent areas in B pons compared to prior imaging from a day ago. PMH: hypertension, type 2 diabetes mellitus, anxiety, Cerebral palsy    PT Comments  Pt presents seated EOB with mother in the room, no complaints of pain. He was able to perform sit<>stand with no AD and CGA-minA, requiring increased time to achieve full upright standing and maintain balance. Pt ambulated ~221ft (180+60) throughout session with RW for first ~4ft, and min-modA throughout. Pt also experienced difficulties in 180* turns, resulting in repeated LOB and requiring mod-maxA from PT to recover. PT noted ambulation without RW resulted in increased instability, more frequent standing rest breaks to regain balance, increased L knee extension thrust, and increased lateral trunk lean to assist with foot clearance. Pt noted he feels improved weight distribution through L foot and overall activity tolerance when walking.  Pt continues to experience limitations in dynamic and static standing balance, LLE coordination and stability with ambulation, L foot sustained clonus during functional activity, and decreased activity tolerance from baseline. Pt continues to be pleasant and extremely motivated to participate in therapy. Pt would benefit from continued skilled therapy to maximize functional abilities.    If plan is discharge home, recommend the following: A  lot of help with walking and/or transfers;A little help with bathing/dressing/bathroom;Assistance with cooking/housework;Direct supervision/assist for financial management;Assist for transportation;Help with stairs or ramp for entrance;Direct supervision/assist for medications management   Can travel by private vehicle        Equipment Recommendations  None recommended by PT (defer to next venue)    Recommendations for Other Services       Precautions / Restrictions Precautions Precautions: Fall Restrictions Weight Bearing Restrictions: No     Mobility  Bed Mobility               General bed mobility comments: Pt seated EOB upon arival    Transfers Overall transfer level: Needs assistance Equipment used: None Transfers: Sit to/from Stand Sit to Stand: Min assist, Contact guard assist           General transfer comment: Requires extra time to achieve full upright standing and maintain balance    Ambulation/Gait Ambulation/Gait assistance: Mod assist Gait Distance (Feet): 200 Feet (160+40) Assistive device: Rolling walker (2 wheels), None Gait Pattern/deviations: Step-through pattern, Decreased step length - left, Decreased stance time - left, Shuffle, Knee hyperextension - left Gait velocity: decreased     General Gait Details: Multiple standing breaks needed to regain balance, difficulty with turns (LOB requiring mod-maxA from PT to recover), improved clonus during functional activities, B/L lateral lean to assist with weight shifting/ foot clearance   Stairs             Wheelchair Mobility     Tilt Bed    Modified Rankin (Stroke Patients Only)       Balance Overall balance assessment: Needs assistance Sitting-balance support: Feet supported, No upper extremity supported Sitting  balance-Leahy Scale: Good     Standing balance support: During functional activity, No upper extremity supported Standing balance-Leahy Scale: Poor                               Cognition Arousal: Alert Behavior During Therapy: WFL for tasks assessed/performed Overall Cognitive Status: Within Functional Limits for tasks assessed                                          Exercises      General Comments General comments (skin integrity, edema, etc.): B/L clonus present with ankle DF (L sustained, R ~5beat), L knee flexion modified ashworth: 1      Pertinent Vitals/Pain Pain Assessment Pain Assessment: No/denies pain    Home Living                          Prior Function            PT Goals (current goals can now be found in the care plan section) Acute Rehab PT Goals Patient Stated Goal: return home PT Goal Formulation: With patient Time For Goal Achievement: 06/11/23 Potential to Achieve Goals: Good Progress towards PT goals: Progressing toward goals    Frequency    Min 1X/week      PT Plan      Co-evaluation              AM-PAC PT "6 Clicks" Mobility   Outcome Measure  Help needed turning from your back to your side while in a flat bed without using bedrails?: A Little Help needed moving from lying on your back to sitting on the side of a flat bed without using bedrails?: A Little Help needed moving to and from a bed to a chair (including a wheelchair)?: A Lot Help needed standing up from a chair using your arms (e.g., wheelchair or bedside chair)?: A Lot Help needed to walk in hospital room?: A Lot Help needed climbing 3-5 steps with a railing? : A Lot 6 Click Score: 14    End of Session Equipment Utilized During Treatment: Gait belt Activity Tolerance: Patient tolerated treatment well Patient left: in chair;with nursing/sitter in room   PT Visit Diagnosis: Other symptoms and signs involving the nervous system (R29.898);Other abnormalities of gait and mobility (R26.89);Difficulty in walking, not elsewhere classified (R26.2);Muscle weakness (generalized)  (M62.81);Unsteadiness on feet (R26.81);Hemiplegia and hemiparesis Hemiplegia - Right/Left: Left     Time: 4098-1191 PT Time Calculation (min) (ACUTE ONLY): 33 min  Charges:    $Gait Training: 8-22 mins $Neuromuscular Re-education: 8-22 mins PT General Charges $$ ACUTE PT VISIT: 1 Visit                    Layani Foronda, PT, SPT 12:13 PM,05/31/23

## 2023-05-31 NOTE — TOC Progression Note (Signed)
Transition of Care [facility]) - Progression Note    Patient Details  Name: Bradley Morrison MRN: [id number] Date of Birth: Oct 06, 1976  [facility]) CM/SW Contact  Garret Reddish, RN Phone Number: 05/31/2023, 10:35 AM  Clinical Narrative:   Chart reviewed.  I have followed up with CIR to check on status of possible admission.  CIR staff Lauren informs me that patient will need updated PT and OT notes.  Once updated notes are received CIR can submit for insurance approval.  I have informed PT/OT staff that patient will need updated notes for today.   Lauren has also made me aware that patient's dog can visit if he is admitted to CIR.  Lauren informs me that the dog will be allowed to visit as long as the dog has up to date shot records.  I have informed patient of the above information.    TOC will continue to follow for discharge planning.           Expected Discharge Plan and Services                                               Social Determinants of Health (SDOH) Interventions SDOH Screenings   Food Insecurity: No Food Insecurity (05/28/2023)  Housing: Low Risk  (05/28/2023)  Transportation Needs: No Transportation Needs (05/28/2023)  Utilities: Not At Risk (05/28/2023)  Financial Resource Strain: Low Risk  (05/19/2023)   Received from [facility]  Tobacco Use: High Risk (05/28/2023)    Readmission Risk Interventions     No data to display

## 2023-05-31 NOTE — Progress Notes (Signed)
PROGRESS NOTE    Bradley Morrison  ZOX:[id number] DOB: 1975/12/16 DOA: 05/27/2023 PCP: [facility]   Assessment & Plan:   Principal Problem:   Acute ischemic stroke [facility]) Active Problems:   Dyslipidemia   Type 2 diabetes mellitus with peripheral neuropathy (HCC)   Essential hypertension   Marijuana abuse   CVA (cerebral vascular accident) (HCC)  Assessment and Plan: Acute ischemic CVA: continue on plavix, aspirin x 3 weeks & then just aspirin thereafter as per neuro. Continue on statin. Continue w/ neuro checks. Repeat MRI brain shows bilateral lower pontine abnormal diffusion, now more intense on the left side versus two days ago. Increased T2 hyperintensity with no hemorrhage or mass effect. Echo shows EF 55-60%, grade I diastolic dysfunction & no intraatrial shunts identified. Neuro following and recs apprec   Dysphagia: likely secondary to acute CVA. Continue on dysphagia II diet as per speech    HLD: continue on statin    DM2: well controlled, HbA1c 6.5. Continue on SSI w/ accuchecks    HTN: BP goal around 140/90 & avoid hypotension as per neuro. Holding losartan, amlodipine    Marijuana abuse: received marijuana cessation counseling already     DVT prophylaxis: lovenox  Code Status: full  Family Communication: discussed pt's care w/ pt's family at bedside and answered their questions  Disposition Plan: possibly d/c to inpatient rehab   Level of care: Telemetry Medical  Status is: Inpatient Remains inpatient appropriate because: severity of illness, waiting on inpatient rehab placement      Consultants:  Neuro   Procedures:   Antimicrobials:   Subjective: Pt c/o malaise    Objective: Vitals:   05/30/23 1949 05/31/23 0002 05/31/23 0242 05/31/23 0629  BP: 125/79 (!) 135/93 (!) 144/97 127/83  Pulse: 74 74 73 81  Resp: 16 16  20   Temp: 97.7 F (36.5 C) 98 F (36.7 C) 98.1 F (36.7 C) 98.3 F (36.8 C)  TempSrc:    Oral  SpO2: 97% 96% 96%  93%  Weight:      Height:        Intake/Output Summary (Last 24 hours) at 05/31/2023 8119 Last data filed at 05/31/2023 0600 Gross per 24 hour  Intake 0 ml  Output 375 ml  Net -375 ml   Filed Weights   05/28/23 0009  Weight: 95.9 kg    Examination:  General exam: Appears comfortable  Respiratory system: clear breath sounds b/l  Cardiovascular system: S1 & S2+. No rubs or clicks Gastrointestinal system: abd is soft, NT, obese & hypoactive bowel sounds Central nervous system: alert & oriented. Dysarthria  Psychiatry:  judgement and insight appears improved. Flat mood and affect     Data Reviewed: I have personally reviewed following labs and imaging studies  CBC: Recent Labs  Lab 05/28/23 0007 05/30/23 0512 05/31/23 0521  WBC 12.4* 11.4* 10.5  NEUTROABS 7.8*  --   --   HGB 15.2 15.0 14.7  HCT 47.3 45.6 46.0  MCV 87.4 85.2 88.1  PLT 305 294 309   Basic Metabolic Panel: Recent Labs  Lab 05/28/23 0007 05/30/23 0512 05/31/23 0521  NA 139 136 142  K 3.9 3.3* 3.7  CL 106 104 110  CO2 24 22 24   GLUCOSE 138* 100* 113*  BUN 17 19 25*  CREATININE 0.68 0.70 0.77  CALCIUM 9.1 9.0 9.2   GFR: Estimated Creatinine Clearance: 121 mL/min (by C-G formula based on SCr of 0.77 mg/dL). Liver Function Tests: Recent Labs  Lab 05/28/23  0007  AST 16  ALT 32  ALKPHOS 88  BILITOT 1.0  PROT 8.4*  ALBUMIN 4.2   No results for input(s): "LIPASE", "AMYLASE" in the last 168 hours. No results for input(s): "AMMONIA" in the last 168 hours. Coagulation Profile: Recent Labs  Lab 05/28/23 0007  INR 1.0   Cardiac Enzymes: No results for input(s): "CKTOTAL", "CKMB", "CKMBINDEX", "TROPONINI" in the last 168 hours. BNP (last 3 results) No results for input(s): "PROBNP" in the last 8760 hours. HbA1C: Recent Labs    05/28/23 1012  HGBA1C 6.5*   CBG: Recent Labs  Lab 05/30/23 0045 05/30/23 0909 05/30/23 1250 05/30/23 1646 05/30/23 1951  GLUCAP 80 108* 115* 107* 168*    Lipid Profile: Recent Labs    05/28/23 1012  CHOL 109  HDL 30*  LDLCALC 64  TRIG 77  CHOLHDL 3.6   Thyroid Function Tests: No results for input(s): "TSH", "T4TOTAL", "FREET4", "T3FREE", "THYROIDAB" in the last 72 hours. Anemia Panel: No results for input(s): "VITAMINB12", "FOLATE", "FERRITIN", "TIBC", "IRON", "RETICCTPCT" in the last 72 hours. Sepsis Labs: No results for input(s): "PROCALCITON", "LATICACIDVEN" in the last 168 hours.  No results found for this or any previous visit (from the past 240 hour(s)).       Radiology Studies: MR BRAIN WO CONTRAST  Result Date: 05/30/2023 CLINICAL DATA:  47 year old male recurrent code stroke presentation. Recent small bilateral brainstem infarcts, by report initially on the right side and 1st diagnosed and treated at [facility] system. EXAM: MRI HEAD WITHOUT CONTRAST TECHNIQUE: Multiplanar, multiecho pulse sequences of the brain and surrounding structures were obtained without intravenous contrast. COMPARISON:  Head CT 0059 hours today. Previous brain MRI 05/28/2023. CTA head and neck 05/28/2023. FINDINGS: Brain: Ongoing restricted diffusion in the lower pons near the pontomedullary junction, bilateral and nearly symmetric on series 9, image 11. Currently the left of midline involvement appears more intense on DWI (series 9, image 11) versus 2 days ago. Corresponding T2 hyperintensity has developed although FLAIR hyperintensity remains fairly mild. No hemorrhage or mass effect. No other diffusion restriction. And no other areas of convincing abnormal diffusion although on coronal imaging mildly abnormal diffusion in both mesial temporal lobes was considered (series 7, image 20). But not correlated on T2 or FLAIR, and isointense on ADC. No superimposed midline shift, mass effect, evidence of mass lesion, ventriculomegaly, extra-axial collection or acute intracranial hemorrhage. Cervicomedullary junction and pituitary are within normal limits.  Minimal scattered nonspecific cerebral white matter T2 and FLAIR hyperintensity is stable. Left lentiform involvement again noted. Deep gray nuclei otherwise appear normal. No cortical encephalomalacia or chronic cerebral blood products. Vascular: Major intracranial vascular flow voids are stable. Dominant appearing distal left vertebral artery. Skull and upper cervical spine: Negative. Visualized bone marrow signal is within normal limits. Sinuses/Orbits: Negative. Other: Visible internal auditory structures appear normal. Multiple scalp sebaceous cysts incidentally noted. IMPRESSION: 1. Bilateral lower pontine abnormal diffusion, now more intense on the left side versus two days ago. Increased T2 hyperintensity with no hemorrhage or mass effect. No new areas of involvement. Small-vessel ischemic etiology is possible. Other etiologies of symmetric brain diffusion abnormality were considered (such as toxic exposure or myelinosis), but seem unlikely given the pattern here. 2. New acute intracranial abnormality. And otherwise mild nonspecific cerebral white matter signal changes. Electronically Signed   By: Odessa Fleming M.D.   On: 05/30/2023 04:29   CT HEAD CODE STROKE WO CONTRAST`  Result Date: 05/30/2023 CLINICAL DATA:  Code stroke.  Acute neurologic deficit EXAM:  CT HEAD WITHOUT CONTRAST TECHNIQUE: Contiguous axial images were obtained from the base of the skull through the vertex without intravenous contrast. RADIATION DOSE REDUCTION: This exam was performed according to the departmental dose-optimization program which includes automated exposure control, adjustment of the mA and/or kV according to patient size and/or use of iterative reconstruction technique. COMPARISON:  05/27/2019 FINDINGS: Brain: There is no mass, hemorrhage or extra-axial collection. The size and configuration of the ventricles and extra-axial CSF spaces are normal. The brain parenchyma is normal, without evidence of acute or chronic  infarction. Vascular: No abnormal hyperdensity of the major intracranial arteries or dural venous sinuses. No intracranial atherosclerosis. Skull: The visualized skull base, calvarium and extracranial soft tissues are normal. Sinuses/Orbits: No fluid levels or advanced mucosal thickening of the visualized paranasal sinuses. No mastoid or middle ear effusion. The orbits are normal. ASPECTS [facility] Stroke Program Early CT Score) - Ganglionic level infarction (caudate, lentiform nuclei, internal capsule, insula, M1-M3 cortex): 7 - Supraganglionic infarction (M4-M6 cortex): 3 Total score (0-10 with 10 being normal): 10 IMPRESSION: 1. No acute intracranial abnormality. 2. ASPECTS is 10. These results were called by telephone at the time of interpretation on 05/30/2023 at 1:03 am to provider Manuela Schwartz, who verbally acknowledged these results. Electronically Signed   By: Deatra Robinson M.D.   On: 05/30/2023 01:04        Scheduled Meds:  aspirin EC  81 mg Oral Daily   atorvastatin  80 mg Oral Daily   clopidogrel  75 mg Oral Daily   docusate sodium  200 mg Oral BID   enoxaparin (LOVENOX) injection  50 mg Subcutaneous Q24H   insulin aspart  0-6 Units Subcutaneous TID WC   Continuous Infusions:     LOS: 3 days    Time spent: 25 mins     [name], MD [facility] Pager [phone]  If 7PM-7AM, please contact night-coverage [email or web address] 05/31/2023, 8:08 AM

## 2023-05-31 NOTE — Care Management Important Message (Signed)
Important Message  Patient Details  Name: Bradley Morrison MRN: [id number] Date of Birth: [birth year]   Medicare Important Message Given:  N/A - LOS <3 / Initial given by admissions     Olegario Messier A Jayelyn Barno 05/31/2023, 10:01 AM

## 2023-05-31 NOTE — Progress Notes (Signed)
Inpatient Rehab Admissions Coordinator:  Spoke to pt on the telephone to inform him that began insurance authorization. Also updated pt's parents who were in the room. Will continue to follow.   Wolfgang Phoenix, MS, CCC-SLP Admissions Coordinator [phone]

## 2023-05-31 NOTE — Progress Notes (Signed)
Speech Language Pathology Treatment: Dysphagia  Patient Details Name: Bradley Morrison MRN: [id number] DOB: 11-12-1975 Today's Date: 05/31/2023 Time: 4098-1191 SLP Time Calculation (min) (ACUTE ONLY): 13 min  Assessment / Plan / Recommendation Clinical Impression  Pt seen for follow up education re: results of MBSS. Pt's mother, Elnita Maxwell, present. Reviewed results of MBSS, diet recommendations, safe swallowing strategies/aspiration precautions, rationale for diet rec's/safe swallowing strategies, risks of aspiration/aspiration PNA, silent vs audible aspiration, changes to swallowing in setting of pontine CVA, and SLP POC. Pt and mother verbalized understanding/agreement. Observed pt with several sips of OJ via straw. Pt required initial verbal cues to implement full chin tuck for duration of swallow. SLP to f/u per established POC. RN and MD made aware of results, recommendations, and SLP POC.   HPI HPI: Pt is a 47 year old male with history of hypertension, hyperlipidemia, diabetes mellitus, cerebral palsy (mainly with some foot problems/deformity), recent Right Pontine stroke in the past week(05/18/23), cared for at [facility] with some residual left-sided weakness and minor speech disturbance; pt denied any difficulty swallowing, only min slower chewing.  Per MD admit note, "apparently this evening around 8 PM, he had worsened speech, mainly increased slurred speech. He also has Right facial weakness. Unclear if his left side is weaker than previously. Apparently symptoms resolved, he went to bed at 9 PM and woke up later with recurrent similar symptoms with the Right facial weakness, altered speech and difficulty swallowing.   On examination, patient has moderate to severe Dysarthria which is definitely worse than previously according to family as well as new right facial weakness. He does have significant left leg weakness, unclear if this is worse than previously. He also has diminished in station of the left  limbs. NIHSS = 7. Most recent MRI brain, 8/11, "1. Bilateral lower pontine abnormal diffusion, now more intense on  the left side versus two days ago. Increased T2 hyperintensity with  no hemorrhage or mass effect.  No new areas of involvement.  Small-vessel ischemic etiology is possible. Other etiologies of  symmetric brain diffusion abnormality were considered (such as toxic  exposure or myelinosis), but seem unlikely given the pattern here."      SLP Plan  Continue with current plan of care      Recommendations for follow up therapy are one component of a multi-disciplinary discharge planning process, led by the attending physician.  Recommendations may be updated based on patient status, additional functional criteria and insurance authorization.    Recommendations  Diet recommendations: Dysphagia 2 (fine chop) Liquids provided via: Cup;Straw;Teaspoon (CHIN TUCK WITH ALL LIQUID INTAKE) Medication Administration: Crushed with puree Supervision: Patient able to self feed;Intermittent supervision to cue for compensatory strategies Compensations: Minimize environmental distractions;Slow rate;Small sips/bites;Chin tuck Postural Changes and/or Swallow Maneuvers: Out of bed for meals;Seated upright 90 degrees;Upright 30-60 min after meal                Rehab consult Oral care BID   Intermittent Supervision/Assistance Dysphagia, oropharyngeal phase (R13.12)     Continue with current plan of care    Bradley Morrison, M.S., CCC-SLP Speech-Language Pathologist [facility] [phone] (ASCOM)  Bradley Morrison  05/31/2023, 9:22 AM

## 2023-06-01 DIAGNOSIS — I639 Cerebral infarction, unspecified: Secondary | ICD-10-CM | POA: Diagnosis not present

## 2023-06-01 LAB — CBC
HCT: 44.9 % (ref 39.0–52.0)
Hemoglobin: 14.4 g/dL (ref 13.0–17.0)
MCH: 28.1 pg (ref 26.0–34.0)
MCHC: 32.1 g/dL (ref 30.0–36.0)
MCV: 87.7 fL (ref 80.0–100.0)
Platelets: 319 10*3/uL (ref 150–400)
RBC: 5.12 MIL/uL (ref 4.22–5.81)
RDW: 13.4 % (ref 11.5–15.5)
WBC: 10.7 10*3/uL — ABNORMAL HIGH (ref 4.0–10.5)
nRBC: 0 % (ref 0.0–0.2)

## 2023-06-01 LAB — BASIC METABOLIC PANEL WITH GFR
Anion gap: 9 (ref 5–15)
BUN: 20 mg/dL (ref 6–20)
CO2: 25 mmol/L (ref 22–32)
Calcium: 9 mg/dL (ref 8.9–10.3)
Chloride: 103 mmol/L (ref 98–111)
Creatinine, Ser: 0.73 mg/dL (ref 0.61–1.24)
GFR, Estimated: >60 mL/min
Glucose, Bld: 96 mg/dL (ref 70–99)
Potassium: 3.4 mmol/L — ABNORMAL LOW (ref 3.5–5.1)
Sodium: 137 mmol/L (ref 135–145)

## 2023-06-01 LAB — GLUCOSE, CAPILLARY
Glucose-Capillary: 105 mg/dL — ABNORMAL HIGH (ref 70–99)
Glucose-Capillary: 100 mg/dL — ABNORMAL HIGH (ref 70–99)
Glucose-Capillary: 99 mg/dL (ref 70–99)
Glucose-Capillary: 104 mg/dL — ABNORMAL HIGH (ref 70–99)

## 2023-06-01 LAB — HOMOCYSTEINE: Homocysteine: 11.1 umol/L (ref 0.0–14.5)

## 2023-06-01 LAB — BETA-2-GLYCOPROTEIN I ABS, IGG/M/A
Beta-2 Glyco I IgG: <9 GPI IgG units (ref 0–20)
Beta-2-Glycoprotein I IgA: <9 GPI IgA units (ref 0–25)
Beta-2-Glycoprotein I IgM: <9 GPI IgM units (ref 0–32)

## 2023-06-01 LAB — CARDIOLIPIN ANTIBODIES, IGG, IGM, IGA
Anticardiolipin IgA: <9 U/mL (ref 0–11)
Anticardiolipin IgG: <9 GPL U/mL (ref 0–14)
Anticardiolipin IgM: <9 [MPL'U]/mL (ref 0–12)

## 2023-06-01 MED ORDER — ASPIRIN 81 MG PO CHEW
81.0000 mg | CHEWABLE_TABLET | Freq: Every day | ORAL | Status: DC
  Administered 2023-06-02: 81 mg via ORAL
  Filled 2023-06-01: qty 1

## 2023-06-01 MED ORDER — DOCUSATE SODIUM 50 MG/5ML PO LIQD
200.0000 mg | Freq: Two times a day (BID) | ORAL | Status: DC
  Administered 2023-06-01 – 2023-06-02 (×2): 200 mg via ORAL
  Filled 2023-06-01 (×2): qty 20

## 2023-06-01 MED ORDER — POTASSIUM CHLORIDE CRYS ER 20 MEQ PO TBCR
20.0000 meq | EXTENDED_RELEASE_TABLET | Freq: Once | ORAL | Status: AC
  Administered 2023-06-01: 20 meq via ORAL
  Filled 2023-06-01: qty 1

## 2023-06-01 NOTE — Progress Notes (Signed)
Occupational Therapy Treatment Patient Details Name: Bradley Morrison MRN: [id number] DOB: 05/18/1976 Today's Date: 06/01/2023   History of present illness Pt is a 47 y/o M admitted on 05/27/23 after presenting with c/o acute onset of worsening dysarthria & R facial droop. Pt recently admitted to [facility] & found to have R pontine stroke. During this admission pt found to have acute & subacute pontine infarction (expansion of stroke that initially happened <2 weeks ago, initially involving R pons but now involving L pons as well). Overnight on 8/10 pt with worsening symptoms & MRI showed more prominent areas in B pons compared to prior imaging from a day ago. PMH: hypertension, type 2 diabetes mellitus, anxiety, Cerebral palsy   OT comments  Bradley Morrison was seen for OT treatment on this date. Upon arrival to room pt reclined in bed, agreeable to tx. Pt requires MIN A + RW toilet t/f and standing hand washing, 1 episode of LOB while doffing shorts in standing. MIN A don L sock in sitting. Seated isolated digit tapping alternating L hand. Coin managment, pt picked up 10 coins x 2 trials, stored in palm, and worked on translating to fingertips to place on dowel. Pt making good progress toward goals, will continue to follow POC. Discharge recommendation remains appropriate.       If plan is discharge home, recommend the following:  A little help with walking and/or transfers;A little help with bathing/dressing/bathroom;Help with stairs or ramp for entrance;Direct supervision/assist for medications management;Assist for transportation;Assistance with cooking/housework   Equipment Recommendations  BSC/3in1    Recommendations for Other Services      Precautions / Restrictions Precautions Precautions: Fall Restrictions Weight Bearing Restrictions: No       Mobility Bed Mobility Overal bed mobility: Modified Independent             General bed mobility comments: increased time    Transfers Overall  transfer level: Needs assistance Equipment used: Rolling walker (2 wheels) Transfers: Sit to/from Stand Sit to Stand: Min assist                 Balance Overall balance assessment: Needs assistance Sitting-balance support: Feet supported, No upper extremity supported Sitting balance-Leahy Scale: Good     Standing balance support: No upper extremity supported, During functional activity Standing balance-Leahy Scale: Fair                             ADL either performed or assessed with clinical judgement   ADL Overall ADL's : Needs assistance/impaired                                       General ADL Comments: MIN A + RW toilet t/f and standing hand washing, 1 episode of LOB while doffing shorts in standing. MIN A don L sock in sitting.      Cognition Arousal: Alert Behavior During Therapy: WFL for tasks assessed/performed Overall Cognitive Status: Within Functional Limits for tasks assessed                                          Exercises Other Exercises Other Exercises: coin managment, pt picked up 10 coins x 2 trials, stored in palm, and worked on translating to fingertips to place  on dowel.        Pertinent Vitals/ Pain       Pain Assessment Pain Assessment: No/denies pain   Frequency  Min 1X/week        Progress Toward Goals  OT Goals(current goals can now be found in the care plan section)  Progress towards OT goals: Progressing toward goals  Acute Rehab OT Goals Patient Stated Goal: to go home OT Goal Formulation: With patient/family Time For Goal Achievement: 06/11/23 Potential to Achieve Goals: Good ADL Goals Pt Will Perform Grooming: with modified independence Pt Will Perform Lower Body Dressing: with modified independence;sit to/from stand Pt Will Transfer to Toilet: with modified independence;ambulating Pt Will Perform Toileting - Clothing Manipulation and hygiene: with modified  independence;sit to/from stand  Plan         AM-PAC OT "6 Clicks" Daily Activity     Outcome Measure   Help from another person eating meals?: A Little Help from another person taking care of personal grooming?: None Help from another person toileting, which includes using toliet, bedpan, or urinal?: A Little Help from another person bathing (including washing, rinsing, drying)?: A Little Help from another person to put on and taking off regular upper body clothing?: A Little Help from another person to put on and taking off regular lower body clothing?: A Lot 6 Click Score: 18    End of Session Equipment Utilized During Treatment: Gait belt;Rolling walker (2 wheels)  OT Visit Diagnosis: Unsteadiness on feet (R26.81);Other abnormalities of gait and mobility (R26.89);Muscle weakness (generalized) (M62.81);Other symptoms and signs involving the nervous system (R29.898)   Activity Tolerance Patient tolerated treatment well   Patient Left in bed;with call bell/phone within reach;with family/visitor present   Nurse Communication          Time: 1610-9604 OT Time Calculation (min): 24 min  Charges: OT General Charges $OT Visit: 1 Visit OT Treatments $Self Care/Home Management : 8-22 mins $Therapeutic Activity: 8-22 mins  Kathie Dike, M.S. OTR/L  06/01/23, 4:32 PM  ascom [phone]

## 2023-06-01 NOTE — Progress Notes (Addendum)
Speech Language Pathology Treatment: Dysphagia;Cognitive-Linquistic  Patient Details Name: Bradley Morrison MRN: [id number] DOB: July 12, 1976 Today's Date: 06/01/2023 Time: 2725-3664 SLP Time Calculation (min) (ACUTE ONLY): 50 min  Assessment / Plan / Recommendation Clinical Impression  Pt seen for follow up education re: results of MBSS; Dysphagia and Dysarthria txs. Reviewed results of MBSS, diet recommendations, safe swallowing strategies/aspiration precautions, rationale for safe swallowing strategies(chin tuck), and diet consistency currently, risks of aspiration/aspiration PNA, silent vs audible aspiration, changes to swallowing in setting of pontine CVA, and SLP POC. Pt verbalized understanding/agreement. Observed pt with several sips of water, juice via straw using the [employer] position. Pt required initial verbal cue and gentle reminder of where to place the cup/straw (low below the chin) to encourage the head down/chin tuck position naturally.   Deficits in pt's motor speech and articulation of speech sounds continues: deficits c/b monoloudness, rushed speech, and slurred speech/imprecise articulation of speech sounds, especially consonants in both isolate and connected speech. Min breathiness noted; lack of full breath support w/ lengthier phrases/sentences and speech noted. 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    SLP provided education re: importance of skilled ST services to address speech-related strategies to combat the "slurred" speech/Dysarthria. Pt educated on speech/articulatory strategies to include: increasing loudness(w/ appropriate breath support), slowing rate of speech, and over-articulation. These were practiced w/ Menu reading and automatic speech tasks. Pt exhibited improved speech intelligibility and precision of speech sounds when utilizing the strategies.  Education and handouts given on the strategies; practiced w/ pt. Questions  answered.     Recommend f/u skilled ST services post D/C to address patient's MOD Dysarthria and speech-related goals using articulatory strategies to increase overall intelligibility, communication effectiveness in ADLs, and quality of life. Recommend f/u w/ skilled ST services post D/C to address ongoing Dysphagia and strategies including chin tuck to reduce risk for aspiration; trials to upgrade food consistency as pt's status continues to improve.  MD and Team updated. SLP to f/u per established POC.     HPI HPI: Pt is a 47 year old male with history of hypertension, hyperlipidemia, diabetes mellitus, cerebral palsy (mainly with some foot problems/deformity), recent Right Pontine stroke in the past week(05/18/23), cared for at [facility] with some residual left-sided weakness and minor speech disturbance; pt denied any difficulty swallowing, only min slower chewing.  Per MD admit note," apparently this evening around 8 PM, he had worsened speech, mainly increased slurred speech. He also has Right facial weakness. Unclear if his left side is weaker than previously. Apparently symptoms resolved, he went to bed at 9 PM and woke up later with recurrent similar symptoms with the Right facial weakness, altered speech and difficulty swallowing.   On examination, patient has moderate to severe Dysarthria which is definitely worse than previously according to family as well as new right facial weakness. He does have significant left leg weakness, unclear if this is worse than previously. He also has diminished in station of the left limbs. NIHSS = 7. Most recent MRI brain, 8/11, "1. Bilateral lower pontine abnormal diffusion, now more intense on  the left side versus two days ago. Increased T2 hyperintensity with  no hemorrhage or mass effect.  No new areas of involvement.  Small-vessel ischemic etiology is possible.".  Repeat MRI brain shows bilateral lower pontine abnormal diffusion, now more intense on the left side  versus two days ago. Increased T2 hyperintensity with no hemorrhage or mass effect. Echo shows EF 55-60%, grade I diastolic dysfunction &  no intraatrial shunts identified.  MBSS completed this admit w/ oral diet and strict aspiration precautions initiated.      SLP Plan  Continue with current plan of care (f/u at next venue of care)      Recommendations for follow up therapy are one component of a multi-disciplinary discharge planning process, led by the attending physician.  Recommendations may be updated based on patient status, additional functional criteria and insurance authorization.    Recommendations  Diet recommendations: Dysphagia 2 (fine chop);Thin liquid Liquids provided via: Cup;Straw Medication Administration: Crushed with puree Supervision: Patient able to self feed;Intermittent supervision to cue for compensatory strategies Compensations: Minimize environmental distractions;Slow rate;Small sips/bites;Chin tuck Postural Changes and/or Swallow Maneuvers: Out of bed for meals;Seated upright 90 degrees;Upright 30-60 min after meal                Rehab consult (next venue of care) Oral care BID;Oral care before and after PO;Patient independent with oral care (setup)   Intermittent Supervision/Assistance Dysphagia, oropharyngeal phase (R13.12);Dysarthria and anarthria (R47.1)     Continue with current plan of care (f/u at next venue of care)       Jerilynn Som, MS, CCC-SLP Speech Language Pathologist Rehab Services; [facility] [phone] (ascom) Bradley Morrison  06/01/2023, 5:30 PM

## 2023-06-01 NOTE — Progress Notes (Addendum)
Physical Therapy Treatment Patient Details Name: Bradley Morrison MRN: [id number] DOB: May 29, 1976 Today's Date: 06/01/2023   History of Present Illness Pt is a 47 y/o M admitted on 05/27/23 after presenting with c/o acute onset of worsening dysarthria & R facial droop. 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 During this admission pt found to have acute & subacute pontine infarction (expansion of stroke that initially happened <2 weeks ago, initially involving R pons but now involving L pons as well). Overnight on 8/10 pt with worsening symptoms & MRI showed more prominent areas in B pons compared to prior imaging from a day ago. PMH: hypertension, type 2 diabetes mellitus, anxiety, Cerebral palsy    PT Comments  Pt presents laying in bed with family in the room, no complaints of pain. Pt able to perform sit<>stand with CGA-minA, increasing assistance needed as he becomes more fatigued. Pt ambulated ~30ft throughout session with varied UE assist and modA. Pt tolerated higher level balance and pregait training (see below) with varied UE assist and modA.  PT noted increased instability with single limb balance/ decreased UE support as well as difficulties with coordinating equal step length and L foot clearance.   Pt continues to have occasional LOB and requiring min-modA from PT for recovery. Pt continues to be extremely motivated during therapy. Pt would benefit from continued skilled care to maximize functional abilities.    If plan is discharge home, recommend the following: A lot of help with walking and/or transfers;A little help with bathing/dressing/bathroom;Assistance with cooking/housework;Direct supervision/assist for financial management;Assist for transportation;Help with stairs or ramp for entrance;Direct supervision/assist for medications management   Can travel by private vehicle        Equipment Recommendations  None recommended by PT (defer to next venue of care)     Recommendations for Other Services       Precautions / Restrictions Precautions Precautions: Fall Restrictions Weight Bearing Restrictions: No     Mobility  Bed Mobility Overal bed mobility: Modified Independent             General bed mobility comments: performed supine>sit with HOB elevated and scooted up in bed during session modi    Transfers Overall transfer level: Needs assistance Equipment used: Rolling walker (2 wheels) Transfers: Sit to/from Stand Sit to Stand: Contact guard assist, Min assist           General transfer comment: MinA as pt becomes more fatigue    Ambulation/Gait Ambulation/Gait assistance: Mod assist Gait Distance (Feet): 90 Feet Assistive device: Rolling walker (2 wheels), None Gait Pattern/deviations: Decreased step length - right, Decreased step length - left, Decreased weight shift to left, Decreased stance time - left Gait velocity: decreased     General Gait Details: occasional loss of balance, requiring minA from PT to recover.   Stairs             Wheelchair Mobility     Tilt Bed    Modified Rankin (Stroke Patients Only)       Balance Overall balance assessment: Needs assistance Sitting-balance support: Feet supported, No upper extremity supported Sitting balance-Leahy Scale: Good     Standing balance support: Bilateral upper extremity supported, During functional activity, Reliant on assistive device for balance Standing balance-Leahy Scale: Fair                              Cognition Arousal: Alert Behavior During Therapy: [facility]  for tasks assessed/performed Overall Cognitive Status: Within Functional Limits for tasks assessed                                          Exercises Other Exercises Other Exercises: B/L clock steps x5, R 2 finger support, minA Other Exercises: B/L side step 109ft each, RUE->no UE support, minA Other Exercises: backwards walking 38ft, RUE  support> no support, minA Other Exercises: b/l foot taps on box x10 ea, BUE>RUE support minA    General Comments General comments (skin integrity, edema, etc.): B/L ankle DF clonus continues to be present  (L sustained, R ~5 beat)      Pertinent Vitals/Pain Pain Assessment Pain Assessment: No/denies pain    Home Living                          Prior Function            PT Goals (current goals can now be found in the care plan section) Progress towards PT goals: Progressing toward goals    Frequency    Min 1X/week      PT Plan      Co-evaluation              AM-PAC PT "6 Clicks" Mobility   Outcome Measure  Help needed turning from your back to your side while in a flat bed without using bedrails?: None Help needed moving from lying on your back to sitting on the side of a flat bed without using bedrails?: None Help needed moving to and from a bed to a chair (including a wheelchair)?: A Lot Help needed standing up from a chair using your arms (e.g., wheelchair or bedside chair)?: A Little Help needed to walk in hospital room?: A Lot Help needed climbing 3-5 steps with a railing? : A Lot 6 Click Score: 17    End of Session Equipment Utilized During Treatment: Gait belt Activity Tolerance: Patient tolerated treatment well Patient left: in bed;with family/visitor present   PT Visit Diagnosis: Other symptoms and signs involving the nervous system (R29.898);Other abnormalities of gait and mobility (R26.89);Difficulty in walking, not elsewhere classified (R26.2);Muscle weakness (generalized) (M62.81);Unsteadiness on feet (R26.81);Hemiplegia and hemiparesis Hemiplegia - Right/Left: Left     Time: 1610-9604 PT Time Calculation (min) (ACUTE ONLY): 38 min  Charges:    $Gait Training: 8-22 mins $Neuromuscular Re-education: 23-37 mins PT General Charges $$ ACUTE PT VISIT: 1 Visit                    Lexi Conaty, PT, SPT 3:56 PM,06/01/23

## 2023-06-01 NOTE — Progress Notes (Signed)
OT Cancellation Note  Patient Details Name: Bradley Morrison MRN: [id number] DOB: 26-Nov-1975   Cancelled Treatment:    Reason Eval/Treat Not Completed: Other (comment). Pt working with PT upon attempt. Will re-attempt OT at later date/time as pt is available.   Arman Filter., MPH, MS, OTR/L ascom [phone] 06/01/23, 3:18 PM

## 2023-06-01 NOTE — Plan of Care (Signed)
  Problem: Education: Goal: Knowledge of disease or condition will improve Outcome: Progressing Goal: Knowledge of secondary prevention will improve (MUST DOCUMENT ALL) Outcome: Progressing Goal: Knowledge of patient specific risk factors will improve (Mark N/A or DELETE if not current risk factor) Outcome: Progressing   Problem: Ischemic Stroke/TIA Tissue Perfusion: Goal: Complications of ischemic stroke/TIA will be minimized Outcome: Progressing   Problem: Coping: Goal: Will verbalize positive feelings about self Outcome: Progressing Goal: Will identify appropriate support needs Outcome: Progressing   Problem: Health Behavior/Discharge Planning: Goal: Ability to manage health-related needs will improve Outcome: Progressing Goal: Goals will be collaboratively established with patient/family Outcome: Progressing   Problem: Self-Care: Goal: Ability to participate in self-care as condition permits will improve Outcome: Progressing Goal: Verbalization of feelings and concerns over difficulty with self-care will improve Outcome: Progressing Goal: Ability to communicate needs accurately will improve Outcome: Progressing   Problem: Nutrition: Goal: Risk of aspiration will decrease Outcome: Progressing Goal: Dietary intake will improve Outcome: Progressing   Problem: Education: Goal: Knowledge of General Education information will improve Description: Including pain rating scale, medication(s)/side effects and non-pharmacologic comfort measures Outcome: Progressing   Problem: Health Behavior/Discharge Planning: Goal: Ability to manage health-related needs will improve Outcome: Progressing   Problem: Clinical Measurements: Goal: Ability to maintain clinical measurements within normal limits will improve Outcome: Progressing Goal: Will remain free from infection Outcome: Progressing Goal: Diagnostic test results will improve Outcome: Progressing Goal: Respiratory  complications will improve Outcome: Progressing Goal: Cardiovascular complication will be avoided Outcome: Progressing   Problem: Activity: Goal: Risk for activity intolerance will decrease Outcome: Progressing   Problem: Nutrition: Goal: Adequate nutrition will be maintained Outcome: Progressing   Problem: Coping: Goal: Level of anxiety will decrease Outcome: Progressing   Problem: Elimination: Goal: Will not experience complications related to bowel motility Outcome: Progressing Goal: Will not experience complications related to urinary retention Outcome: Progressing   Problem: Pain Managment: Goal: General experience of comfort will improve Outcome: Progressing   Problem: Safety: Goal: Ability to remain free from injury will improve Outcome: Progressing   Problem: Skin Integrity: Goal: Risk for impaired skin integrity will decrease Outcome: Progressing   Problem: Education: Goal: Ability to describe self-care measures that may prevent or decrease complications (Diabetes Survival Skills Education) will improve Outcome: Progressing Goal: Individualized Educational Video(s) Outcome: Progressing   Problem: Coping: Goal: Ability to adjust to condition or change in health will improve Outcome: Progressing   Problem: Fluid Volume: Goal: Ability to maintain a balanced intake and output will improve Outcome: Progressing   Problem: Health Behavior/Discharge Planning: Goal: Ability to identify and utilize available resources and services will improve Outcome: Progressing Goal: Ability to manage health-related needs will improve Outcome: Progressing   Problem: Metabolic: Goal: Ability to maintain appropriate glucose levels will improve Outcome: Progressing   Problem: Nutritional: Goal: Maintenance of adequate nutrition will improve Outcome: Progressing Goal: Progress toward achieving an optimal weight will improve Outcome: Progressing   Problem: Skin  Integrity: Goal: Risk for impaired skin integrity will decrease Outcome: Progressing   Problem: Tissue Perfusion: Goal: Adequacy of tissue perfusion will improve Outcome: Progressing   

## 2023-06-01 NOTE — Progress Notes (Signed)
PROGRESS NOTE    Bradley Morrison  ZDG:[id number] DOB: [birth year] DOA: 05/27/2023 PCP: [facility]   Assessment & Plan:   Principal Problem:   Acute ischemic stroke [facility]) Active Problems:   Dyslipidemia   Type 2 diabetes mellitus with peripheral neuropathy (HCC)   Essential hypertension   Marijuana abuse   CVA (cerebral vascular accident) (HCC)  Assessment and Plan: Acute ischemic CVA: continue on aspirin, plavix x 3 weeks & then just aspirin thereafter as per neuro. Continue on statin. Continue w/ neuro checks. Repeat MRI brain shows bilateral lower pontine abnormal diffusion, now more intense on the left side versus two days ago. Increased T2 hyperintensity with no hemorrhage or mass effect. Echo shows EF 55-60%, grade I diastolic dysfunction & no intraatrial shunts identified. Neuro recs apprec  Dysphagia: likely secondary to acute CVA. Continue on dysphagia II diet as per speech    HLD: continue on statin    DM2: HbA1c 6.5, well controlled. Restarted home dose of farxiga & continue on SSI w/ accuchecks    HTN: BP goal around 140/90 & avoid hypotension as per neuro. Holding amlodipine, losartan    Marijuana abuse: received marijuana cessation counseling already     DVT prophylaxis: lovenox  Code Status: full  Family Communication: discussed pt's care w/ pt's family at bedside and answered their questions  Disposition Plan: possibly d/c to inpatient rehab   Level of care: Telemetry Medical  Status is: Inpatient Remains inpatient appropriate because: severity of illness, waiting on insurance auth      Consultants:  Neuro   Procedures:   Antimicrobials:   Subjective: Pt c/o fatigue   Objective: Vitals:   05/31/23 1818 05/31/23 2028 06/01/23 0024 06/01/23 0425  BP: 129/70 (!) 141/97 (!) 141/95 (!) 128/96  Pulse: 86 80 71 74  Resp: 18 18 18 16   Temp: 98.2 F (36.8 C) 99 F (37.2 C) 98.3 F (36.8 C) 98.1 F (36.7 C)  TempSrc: Oral Oral Oral  Oral  SpO2: 99% 98% 94% 92%  Weight:      Height:        Intake/Output Summary (Last 24 hours) at 06/01/2023 0813 Last data filed at 06/01/2023 0600 Gross per 24 hour  Intake 720 ml  Output 425 ml  Net 295 ml   Filed Weights   05/28/23 0009  Weight: 95.9 kg    Examination:  General exam: appears calm & comfortable  Respiratory system: clear breath sounds b/l  Cardiovascular system: S1/S2+. No rubs or clicks  Gastrointestinal system: abd is soft, NT, obese & normal bowel sounds  Central nervous system: alert & oriented. Dysarthria  Psychiatry:  judgement and insight appears normal. Flat mood and affect    Data Reviewed: I have personally reviewed following labs and imaging studies  CBC: Recent Labs  Lab 05/28/23 0007 05/30/23 0512 05/31/23 0521 06/01/23 0330  WBC 12.4* 11.4* 10.5 10.7*  NEUTROABS 7.8*  --   --   --   HGB 15.2 15.0 14.7 14.4  HCT 47.3 45.6 46.0 44.9  MCV 87.4 85.2 88.1 87.7  PLT 305 294 309 319   Basic Metabolic Panel: Recent Labs  Lab 05/28/23 0007 05/30/23 0512 05/31/23 0521 06/01/23 0330  NA 139 136 142 137  K 3.9 3.3* 3.7 3.4*  CL 106 104 110 103  CO2 24 22 24 25   GLUCOSE 138* 100* 113* 96  BUN 17 19 25* 20  CREATININE 0.68 0.70 0.77 0.73  CALCIUM 9.1 9.0 9.2 9.0  GFR: Estimated Creatinine Clearance: 121 mL/min (by C-G formula based on SCr of 0.73 mg/dL). Liver Function Tests: Recent Labs  Lab 05/28/23 0007  AST 16  ALT 32  ALKPHOS 88  BILITOT 1.0  PROT 8.4*  ALBUMIN 4.2   No results for input(s): "LIPASE", "AMYLASE" in the last 168 hours. No results for input(s): "AMMONIA" in the last 168 hours. Coagulation Profile: Recent Labs  Lab 05/28/23 0007  INR 1.0   Cardiac Enzymes: No results for input(s): "CKTOTAL", "CKMB", "CKMBINDEX", "TROPONINI" in the last 168 hours. BNP (last 3 results) No results for input(s): "PROBNP" in the last 8760 hours. HbA1C: No results for input(s): "HGBA1C" in the last 72  hours.  CBG: Recent Labs  Lab 05/30/23 1951 05/31/23 0852 05/31/23 1151 05/31/23 1804 05/31/23 2035  GLUCAP 168* 149* 119* 96 156*   Lipid Profile: No results for input(s): "CHOL", "HDL", "LDLCALC", "TRIG", "CHOLHDL", "LDLDIRECT" in the last 72 hours.  Thyroid Function Tests: No results for input(s): "TSH", "T4TOTAL", "FREET4", "T3FREE", "THYROIDAB" in the last 72 hours. Anemia Panel: No results for input(s): "VITAMINB12", "FOLATE", "FERRITIN", "TIBC", "IRON", "RETICCTPCT" in the last 72 hours. Sepsis Labs: No results for input(s): "PROCALCITON", "LATICACIDVEN" in the last 168 hours.  No results found for this or any previous visit (from the past 240 hour(s)).       Radiology Studies: DG Swallowing Func-Speech Pathology  Result Date: 05/31/2023 Table formatting from the original result was not included. Modified Barium Swallow Study Patient Details Name: Bradley Morrison MRN: [id number] Date of Birth: August 02, 1976 Today's Date: 05/31/2023 HPI/PMH: HPI: Pt is a 47 year old male with history of hypertension, hyperlipidemia, diabetes mellitus, cerebral palsy (mainly with some foot problems/deformity), recent Right Pontine stroke in the past week(05/18/23), cared for at [facility] with some residual left-sided weakness and minor speech disturbance; pt denied any difficulty swallowing, only min slower chewing.  Per MD admit note, "apparently this evening around 8 PM, he had worsened speech, mainly increased slurred speech. He also has Right facial weakness. Unclear if his left side is weaker than previously. Apparently symptoms resolved, he went to bed at 9 PM and woke up later with recurrent similar symptoms with the Right facial weakness, altered speech and difficulty swallowing.   On examination, patient has moderate to severe Dysarthria which is definitely worse than previously according to family as well as new right facial weakness. He does have significant left leg weakness, unclear if this is  worse than previously. He also has diminished in station of the left limbs. NIHSS = 7. Most recent MRI brain, 8/11, "1. Bilateral lower pontine abnormal diffusion, now more intense on  the left side versus two days ago. Increased T2 hyperintensity with  no hemorrhage or mass effect.  No new areas of involvement.  Small-vessel ischemic etiology is possible. Other etiologies of  symmetric brain diffusion abnormality were considered (such as toxic  exposure or myelinosis), but seem unlikely given the pattern here." Clinical Impression: Clinical Impression: Pt presents with a mild oral dysphagia. Pt with delayed swallow initiation and mistimed/reduced laryngeal vesibule closure which attributed to before the swallow penetration and aspiration of thin liquids. Aspiration was inconsistently audible due to reduced laryngeal sensation. Oral phase notable for prolonged mastication and delayed lingual motion due to lingual weakness/incoorindation. Chin tuck was effective in eliminating aspiration, but not penetration, with thin liquids via cup and straw. Penetration with a chin thuck occured before/during the swallow, transient in nature, and cleared with laryngeal vestibule closure. Recommend a Dysphagia 2 Diet  with Thin Liquids with safe swallowing strategies/aspiration precautions including, but not limited to, chin tuck with all liquid intake. SLP to f/u per POC for diet tolerance, dysphagia management, and dysarthria tx.  Pt educated immediately following MBSS re: diet recommendations, safe swallowing strategies/aspiration precautions, rationale for diet recommendations/safe swallowing strategies, and SLP POC. Pt shown videofluoroscopic images for reinforcement of content. Factors that may increase risk of adverse event in presence of aspiration Rubye Oaks & Clearance Coots 2021): Factors that may increase risk of adverse event in presence of aspiration Rubye Oaks & Clearance Coots 2021): Aspiration of thick, dense, and/or acidic materials  (recent stroke) Recommendations/Plan: Swallowing Evaluation Recommendations Swallowing Evaluation Recommendations Recommendations: PO diet PO Diet Recommendation: Dysphagia 2 (Finely chopped); Thin liquids (Level 0) Liquid Administration via: Spoon; Cup; Straw Medication Administration: Crushed with puree Supervision: Patient able to self-feed; Intermittent supervision/cueing for swallowing strategies Swallowing strategies  : Slow rate; Small bites/sips; Chin tuck Postural changes: Position pt fully upright for meals; Stay upright 30-60 min after meals; Out of bed for meals Oral care recommendations: Oral care BID (2x/day); Pt independent with oral care Treatment Plan Treatment Plan Treatment recommendations: Therapy as outlined in treatment plan below Follow-up recommendations: Acute inpatient rehab (3 hours/day) Functional status assessment: Patient has had a recent decline in their functional status and demonstrates the ability to make significant improvements in function in a reasonable and predictable amount of time. Treatment frequency: Min 2x/week Treatment duration: 2 weeks Interventions: Aspiration precaution training; Oropharyngeal exercises; Compensatory techniques; Patient/family education; Trials of upgraded texture/liquids; Diet toleration management by SLP Recommendations Recommendations for follow up therapy are one component of a multi-disciplinary discharge planning process, led by the attending physician.  Recommendations may be updated based on patient status, additional functional criteria and insurance authorization. Assessment: Orofacial Exam: Orofacial Exam Oral Cavity: Oral Hygiene: WFL Oral Cavity - Dentition: Adequate natural dentition Orofacial Anatomy: WFL Oral Motor/Sensory Function: Suspected cranial nerve impairment CN V - Trigeminal: Right motor impairment CN IX - Glossopharyngeal, CN X - Vagus: Right motor impairment CN XII - Hypoglossal: Right motor impairment Anatomy: Anatomy: WFL  Boluses Administered: Boluses Administered Boluses Administered: Thin liquids (Level 0); Puree; Solid  Oral Impairment Domain: Oral Impairment Domain Lip Closure: No labial escape Tongue control during bolus hold: Cohesive bolus between tongue to palatal seal Bolus preparation/mastication: Slow prolonged chewing/mashing with complete recollection Bolus transport/lingual motion: Slow tongue motion Oral residue: Trace residue lining oral structures Initiation of pharyngeal swallow : Posterior angle of the ramus; Valleculae  Pharyngeal Impairment Domain: Pharyngeal Impairment Domain Soft palate elevation: No bolus between soft palate (SP)/pharyngeal wall (PW) Laryngeal elevation: Complete superior movement of thyroid cartilage with complete approximation of arytenoids to epiglottic petiole Anterior hyoid excursion: Complete anterior movement Epiglottic movement: Complete inversion Laryngeal vestibule closure: Incomplete, narrow column air/contrast in laryngeal vestibule Pharyngeal stripping wave : Present - complete Pharyngeal contraction (A/P view only): N/A Pharyngoesophageal segment opening: Complete distension and complete duration, no obstruction of flow Tongue base retraction: No contrast between tongue base and posterior pharyngeal wall (PPW) Pharyngeal residue: Complete pharyngeal clearance  Esophageal Impairment Domain: Esophageal Impairment Domain Esophageal clearance upright position: -- (NA) Pill: No data recorded Penetration/Aspiration Scale Score: Penetration/Aspiration Scale Score 1.  Material does not enter airway: Puree; Solid 2.  Material enters airway, remains ABOVE vocal cords then ejected out: Thin liquids (Level 0) 3.  Material enters airway, remains ABOVE vocal cords and not ejected out: Thin liquids (Level 0) 6.  Material enters airway, passes BELOW cords then ejected out: Thin liquids (Level  0) 7.  Material enters airway, passes BELOW cords and not ejected out despite cough attempt by patient:  Thin liquids (Level 0) 8.  Material enters airway, passes BELOW cords without attempt by patient to eject out (silent aspiration) : Thin liquids (Level 0) Compensatory Strategies: Compensatory Strategies Compensatory strategies: Yes Straw: Effective (with chin tuck) Effective Straw: Thin liquid (Level 0) Chin tuck: Effective Effective Chin Tuck: Thin liquid (Level 0)   General Information: Caregiver present: No  Diet Prior to this Study: Dysphagia 2 (finely chopped); Extremely thick liquids (Level 4, pudding thick)   Temperature : Normal   Respiratory Status: WFL   Supplemental O2: None (Room air)   History of Recent Intubation: No  Behavior/Cognition: Alert; Cooperative; Pleasant mood (notable dysarthria) Self-Feeding Abilities: Able to self-feed Baseline vocal quality/speech: Hypophonia/low volume; Abnormal resonance (intermittent hypernasality) Volitional Cough: Able to elicit Volitional Swallow: Able to elicit No data recorded Goal Planning: Prognosis for improved oropharyngeal function: Fair Barriers to Reach Goals: Overall medical prognosis No data recorded Patient/Family Stated Goal: "[employer]" Consulted and agree with results and recommendations: Patient Pain: Pain Assessment Pain Assessment: No/denies pain End of Session: Start Time:SLP Start Time (ACUTE ONLY): 0815 Stop Time: SLP Stop Time (ACUTE ONLY): 0835 Time Calculation:SLP Time Calculation (min) (ACUTE ONLY): 20 min Charges: SLP Evaluations $ SLP Speech Visit: 1 Visit SLP Evaluations $MBS Swallow: 1 Procedure SLP visit diagnosis: SLP Visit Diagnosis: Dysphagia, oropharyngeal phase (R13.12) Past Medical History: Past Medical History: Diagnosis Date  Anxiety   CP (cerebral palsy) (HCC)   Hypertension   Stroke [facility])  Past Surgical History: No past surgical history on file. Clyde Canterbury, M.S., CCC-SLP Speech-Language Pathologist [facility] [phone] (ASCOM) Woodroe Chen 05/31/2023, 9:18 AM        Scheduled Meds:  aspirin EC  81 mg Oral Daily   atorvastatin  80 mg Oral Daily   clopidogrel  75 mg Oral Daily   dapagliflozin propanediol  10 mg Oral Daily   docusate sodium  200 mg Oral BID   enoxaparin (LOVENOX) injection  50 mg Subcutaneous Q24H   gabapentin  300 mg Oral TID   insulin aspart  0-6 Units Subcutaneous TID WC   polyethylene glycol  17 g Oral Daily   Continuous Infusions:     LOS: 4 days    Time spent: 25 mins     [name], MD [facility] Pager [phone]  If 7PM-7AM, please contact night-coverage [email or web address] 06/01/2023, 8:13 AM

## 2023-06-01 NOTE — Plan of Care (Signed)
  Problem: Ischemic Stroke/TIA Tissue Perfusion: Goal: Complications of ischemic stroke/TIA will be minimized Outcome: Progressing   Problem: Coping: Goal: Will verbalize positive feelings about self Outcome: Progressing Goal: Will identify appropriate support needs Outcome: Progressing   Problem: Health Behavior/Discharge Planning: Goal: Ability to manage health-related needs will improve Outcome: Progressing Goal: Goals will be collaboratively established with patient/family Outcome: Progressing   Problem: Self-Care: Goal: Ability to participate in self-care as condition permits will improve Outcome: Progressing Goal: Verbalization of feelings and concerns over difficulty with self-care will improve Outcome: Progressing Goal: Ability to communicate needs accurately will improve Outcome: Progressing   Problem: Nutrition: Goal: Risk of aspiration will decrease Outcome: Progressing Goal: Dietary intake will improve Outcome: Progressing   Problem: Education: Goal: Knowledge of General Education information will improve Description: Including pain rating scale, medication(s)/side effects and non-pharmacologic comfort measures Outcome: Progressing   Problem: Health Behavior/Discharge Planning: Goal: Ability to manage health-related needs will improve Outcome: Progressing   Problem: Clinical Measurements: Goal: Ability to maintain clinical measurements within normal limits will improve Outcome: Progressing Goal: Will remain free from infection Outcome: Progressing Goal: Diagnostic test results will improve Outcome: Progressing Goal: Respiratory complications will improve Outcome: Progressing Goal: Cardiovascular complication will be avoided Outcome: Progressing   Problem: Activity: Goal: Risk for activity intolerance will decrease Outcome: Progressing   Problem: Nutrition: Goal: Adequate nutrition will be maintained Outcome: Progressing   Problem: Coping: Goal:  Level of anxiety will decrease Outcome: Progressing   Problem: Elimination: Goal: Will not experience complications related to bowel motility Outcome: Progressing Goal: Will not experience complications related to urinary retention Outcome: Progressing

## 2023-06-01 NOTE — Progress Notes (Signed)
Inpatient Rehab Admissions Coordinator:   I continue to await insurance auth for CIR admit. Will follow for admit pending insurance auth.  Megan Salon, MS, CCC-SLP Rehab Admissions Coordinator  [phone] (celll) [phone] (office)

## 2023-06-01 NOTE — Care Management Important Message (Signed)
Important Message  Patient Details  Name: Bradley Morrison MRN: [id number] Date of Birth: 01-08-1976   Medicare Important Message Given:  Yes     Olegario Messier A Noemy Hallmon 06/01/2023, 12:31 PM

## 2023-06-01 NOTE — Progress Notes (Signed)
   06/01/23 1400  Spiritual Encounters  Type of Visit Initial  Care provided to: Pt and family  Referral source Patient request  Reason for visit Religious ritual  OnCall Visit Yes  Spiritual Framework  Presenting Themes Meaning/purpose/sources of inspiration;Rituals and practive  Community/Connection Family  Patient Stress Factors Major life changes  Interventions  Spiritual Care Interventions Made Established relationship of care and support;Compassionate presence;Reflective listening;Normalization of emotions;Prayer  Intervention Outcomes  Outcomes Connection to spiritual care  Spiritual Care Plan  Spiritual Care Issues Still Outstanding No further spiritual care needs at this time (see row info)   Spoke with patient about his purpose in the world and [place] of God. 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 Prayer for patient and his family for spiritual guidance and acceptance. Marland Kitchen

## 2023-06-02 ENCOUNTER — Other Ambulatory Visit: Payer: Self-pay

## 2023-06-02 ENCOUNTER — Encounter: Payer: Self-pay | Admitting: Internal Medicine

## 2023-06-02 ENCOUNTER — Inpatient Hospital Stay (HOSPITAL_COMMUNITY)
Admission: EM | Admit: 2023-06-02 | Discharge: 2023-06-11 | DRG: 057 | Disposition: A | Discharge status: Home Health | Payer: Medicare HMO | Source: Other Acute Inpatient Hospital | Attending: Physical Medicine & Rehabilitation | Admitting: Physical Medicine & Rehabilitation

## 2023-06-02 ENCOUNTER — Encounter (HOSPITAL_COMMUNITY): Payer: Self-pay

## 2023-06-02 DIAGNOSIS — K5900 Constipation, unspecified: Secondary | ICD-10-CM | POA: Insufficient documentation

## 2023-06-02 DIAGNOSIS — Z79899 Other long term (current) drug therapy: Secondary | ICD-10-CM | POA: Diagnosis not present

## 2023-06-02 DIAGNOSIS — I69322 Dysarthria following cerebral infarction: Secondary | ICD-10-CM | POA: Diagnosis not present

## 2023-06-02 DIAGNOSIS — F17210 Nicotine dependence, cigarettes, uncomplicated: Secondary | ICD-10-CM | POA: Diagnosis present

## 2023-06-02 DIAGNOSIS — E1140 Type 2 diabetes mellitus with diabetic neuropathy, unspecified: Secondary | ICD-10-CM | POA: Diagnosis present

## 2023-06-02 DIAGNOSIS — E669 Obesity, unspecified: Secondary | ICD-10-CM | POA: Diagnosis present

## 2023-06-02 DIAGNOSIS — E876 Hypokalemia: Secondary | ICD-10-CM | POA: Diagnosis present

## 2023-06-02 DIAGNOSIS — Z7984 Long term (current) use of oral hypoglycemic drugs: Secondary | ICD-10-CM | POA: Diagnosis not present

## 2023-06-02 DIAGNOSIS — Z888 Allergy status to other drugs, medicaments and biological substances status: Secondary | ICD-10-CM

## 2023-06-02 DIAGNOSIS — M7062 Trochanteric bursitis, left hip: Secondary | ICD-10-CM | POA: Diagnosis present

## 2023-06-02 DIAGNOSIS — Z7982 Long term (current) use of aspirin: Secondary | ICD-10-CM | POA: Diagnosis not present

## 2023-06-02 DIAGNOSIS — R1312 Dysphagia, oropharyngeal phase: Secondary | ICD-10-CM | POA: Diagnosis present

## 2023-06-02 DIAGNOSIS — I69391 Dysphagia following cerebral infarction: Secondary | ICD-10-CM

## 2023-06-02 DIAGNOSIS — Z6832 Body mass index (BMI) 32.0-32.9, adult: Secondary | ICD-10-CM | POA: Diagnosis not present

## 2023-06-02 DIAGNOSIS — E1142 Type 2 diabetes mellitus with diabetic polyneuropathy: Secondary | ICD-10-CM | POA: Diagnosis present

## 2023-06-02 DIAGNOSIS — Z7902 Long term (current) use of antithrombotics/antiplatelets: Secondary | ICD-10-CM

## 2023-06-02 DIAGNOSIS — I69354 Hemiplegia and hemiparesis following cerebral infarction affecting left non-dominant side: Secondary | ICD-10-CM | POA: Diagnosis present

## 2023-06-02 DIAGNOSIS — Z716 Tobacco abuse counseling: Secondary | ICD-10-CM | POA: Diagnosis not present

## 2023-06-02 DIAGNOSIS — G479 Sleep disorder, unspecified: Secondary | ICD-10-CM | POA: Diagnosis not present

## 2023-06-02 DIAGNOSIS — E1169 Type 2 diabetes mellitus with other specified complication: Secondary | ICD-10-CM | POA: Diagnosis present

## 2023-06-02 DIAGNOSIS — I152 Hypertension secondary to endocrine disorders: Secondary | ICD-10-CM | POA: Diagnosis present

## 2023-06-02 DIAGNOSIS — I10 Essential (primary) hypertension: Secondary | ICD-10-CM | POA: Diagnosis not present

## 2023-06-02 DIAGNOSIS — E1159 Type 2 diabetes mellitus with other circulatory complications: Secondary | ICD-10-CM | POA: Diagnosis present

## 2023-06-02 DIAGNOSIS — E785 Hyperlipidemia, unspecified: Secondary | ICD-10-CM | POA: Diagnosis present

## 2023-06-02 DIAGNOSIS — J449 Chronic obstructive pulmonary disease, unspecified: Secondary | ICD-10-CM | POA: Diagnosis present

## 2023-06-02 DIAGNOSIS — I639 Cerebral infarction, unspecified: Secondary | ICD-10-CM | POA: Diagnosis not present

## 2023-06-02 DIAGNOSIS — R1310 Dysphagia, unspecified: Secondary | ICD-10-CM

## 2023-06-02 DIAGNOSIS — I6350 Cerebral infarction due to unspecified occlusion or stenosis of unspecified cerebral artery: Secondary | ICD-10-CM | POA: Diagnosis not present

## 2023-06-02 DIAGNOSIS — F17200 Nicotine dependence, unspecified, uncomplicated: Secondary | ICD-10-CM | POA: Insufficient documentation

## 2023-06-02 LAB — GLUCOSE, CAPILLARY
Glucose-Capillary: 107 mg/dL — ABNORMAL HIGH (ref 70–99)
Glucose-Capillary: 126 mg/dL — ABNORMAL HIGH (ref 70–99)

## 2023-06-02 MED ORDER — ATORVASTATIN CALCIUM 80 MG PO TABS: 80.0000 mg | ORAL_TABLET | Freq: Every day | ORAL | 0 refills | Status: DC

## 2023-06-02 MED ORDER — DOCUSATE SODIUM 100 MG PO CAPS
200.0000 mg | ORAL_CAPSULE | Freq: Two times a day (BID) | ORAL | Status: DC
  Administered 2023-06-02 – 2023-06-11 (×12): 200 mg via ORAL
  Filled 2023-06-02 (×16): qty 2

## 2023-06-02 MED ORDER — ACETAMINOPHEN 325 MG PO TABS
325.0000 mg | ORAL_TABLET | ORAL | Status: DC | PRN
  Administered 2023-06-02 – 2023-06-11 (×17): 650 mg via ORAL
  Filled 2023-06-02 (×17): qty 2

## 2023-06-02 MED ORDER — DIPHENHYDRAMINE HCL 25 MG PO CAPS: 25.0000 mg | ORAL_CAPSULE | Freq: Four times a day (QID) | ORAL | Status: DC | PRN

## 2023-06-02 MED ORDER — CLOPIDOGREL BISULFATE 75 MG PO TABS: 75.0000 mg | ORAL_TABLET | Freq: Every day | ORAL | 0 refills | Status: DC

## 2023-06-02 MED ORDER — CLOPIDOGREL BISULFATE 75 MG PO TABS
75.0000 mg | ORAL_TABLET | Freq: Every day | ORAL | Status: DC
  Administered 2023-06-03 – 2023-06-11 (×9): 75 mg via ORAL
  Filled 2023-06-02 (×9): qty 1

## 2023-06-02 MED ORDER — MAGNESIUM GLUCONATE 500 MG PO TABS
250.0000 mg | ORAL_TABLET | Freq: Every day | ORAL | Status: DC
  Administered 2023-06-02 – 2023-06-10 (×9): 250 mg via ORAL
  Filled 2023-06-02 (×9): qty 1

## 2023-06-02 MED ORDER — ATORVASTATIN CALCIUM 80 MG PO TABS
80.0000 mg | ORAL_TABLET | Freq: Every day | ORAL | Status: DC
  Administered 2023-06-03 – 2023-06-11 (×9): 80 mg via ORAL
  Filled 2023-06-02 (×9): qty 1

## 2023-06-02 MED ORDER — PROCHLORPERAZINE MALEATE 5 MG PO TABS: 5.0000 mg | ORAL_TABLET | Freq: Four times a day (QID) | ORAL | Status: DC | PRN

## 2023-06-02 MED ORDER — METFORMIN HCL 500 MG PO TABS
500.0000 mg | ORAL_TABLET | Freq: Every day | ORAL | Status: DC
  Administered 2023-06-03 – 2023-06-11 (×9): 500 mg via ORAL
  Filled 2023-06-02 (×9): qty 1

## 2023-06-02 MED ORDER — GABAPENTIN 300 MG PO CAPS
300.0000 mg | ORAL_CAPSULE | Freq: Three times a day (TID) | ORAL | Status: DC
  Administered 2023-06-02 – 2023-06-11 (×27): 300 mg via ORAL
  Filled 2023-06-02 (×27): qty 1

## 2023-06-02 MED ORDER — PROCHLORPERAZINE EDISYLATE 10 MG/2ML IJ SOLN: 5.0000 mg | Freq: Four times a day (QID) | INTRAMUSCULAR | Status: DC | PRN

## 2023-06-02 MED ORDER — ASPIRIN 81 MG PO CHEW
81.0000 mg | CHEWABLE_TABLET | Freq: Every day | ORAL | Status: DC
  Administered 2023-06-03 – 2023-06-11 (×9): 81 mg via ORAL
  Filled 2023-06-02 (×9): qty 1

## 2023-06-02 MED ORDER — BISACODYL 10 MG RE SUPP
10.0000 mg | Freq: Every day | RECTAL | Status: DC | PRN
  Administered 2023-06-09: 10 mg via RECTAL
  Filled 2023-06-02: qty 1

## 2023-06-02 MED ORDER — POLYETHYLENE GLYCOL 3350 17 G PO PACK
17.0000 g | PACK | Freq: Every day | ORAL | Status: DC
  Administered 2023-06-03 – 2023-06-11 (×6): 17 g via ORAL
  Filled 2023-06-02 (×8): qty 1

## 2023-06-02 MED ORDER — TRAZODONE HCL 50 MG PO TABS
25.0000 mg | ORAL_TABLET | Freq: Every evening | ORAL | Status: DC | PRN
  Administered 2023-06-03 – 2023-06-10 (×6): 50 mg via ORAL
  Filled 2023-06-02 (×6): qty 1

## 2023-06-02 MED ORDER — GUAIFENESIN-DM 100-10 MG/5ML PO SYRP: 5.0000 mL | ORAL_SOLUTION | Freq: Four times a day (QID) | ORAL | Status: DC | PRN

## 2023-06-02 MED ORDER — PROCHLORPERAZINE 25 MG RE SUPP: 12.5000 mg | Freq: Four times a day (QID) | RECTAL | Status: DC | PRN

## 2023-06-02 MED ORDER — ENOXAPARIN SODIUM 40 MG/0.4ML IJ SOSY
40.0000 mg | PREFILLED_SYRINGE | INTRAMUSCULAR | Status: DC
  Administered 2023-06-03 – 2023-06-10 (×7): 40 mg via SUBCUTANEOUS
  Filled 2023-06-02 (×8): qty 0.4

## 2023-06-02 MED ORDER — FLEET ENEMA RE ENEM: 1.0000 | ENEMA | Freq: Once | RECTAL | Status: DC | PRN

## 2023-06-02 MED ORDER — LACTULOSE 10 GM/15ML PO SOLN
30.0000 g | Freq: Every day | ORAL | Status: DC | PRN
  Administered 2023-06-04: 30 g via ORAL
  Filled 2023-06-02: qty 45

## 2023-06-02 MED ORDER — DAPAGLIFLOZIN PROPANEDIOL 10 MG PO TABS
10.0000 mg | ORAL_TABLET | Freq: Every day | ORAL | Status: DC
  Administered 2023-06-03 – 2023-06-11 (×9): 10 mg via ORAL
  Filled 2023-06-02 (×9): qty 1

## 2023-06-02 MED ORDER — ASPIRIN 81 MG PO CHEW: 81.0000 mg | CHEWABLE_TABLET | Freq: Every day | ORAL | 0 refills | Status: DC

## 2023-06-02 MED ORDER — ALUM & MAG HYDROXIDE-SIMETH 200-200-20 MG/5ML PO SUSP: 30.0000 mL | ORAL | Status: DC | PRN

## 2023-06-02 NOTE — Progress Notes (Signed)
Inpatient Rehabilitation Admission Medication Review by a Pharmacist  A complete drug regimen review was completed for this patient to identify any potential clinically significant medication issues.  High Risk Drug Classes Is patient taking? Indication by Medication  Antipsychotic Yes Compazine prn N/V  Anticoagulant No Lovenox - VTE ppx  Antibiotic No   Opioid No   Antiplatelet Yes Aspirin, clopidogrel x 3 weeks - CVA  Hypoglycemics/insulin Yes Bradley Morrison - DM  Vasoactive Medication No   Chemotherapy No   Other Yes Atorvastatin - HLD Gabapentin - pain Lactulose prn constipation  Trazodone prn sleep     Type of Medication Issue Identified Description of Issue Recommendation(s)  Drug Interaction(s) (clinically significant)     Duplicate Therapy     Allergy     No Medication Administration End Date     Incorrect Dose     Additional Drug Therapy Needed     Significant med changes from prior encounter (inform family/care partners about these prior to discharge). Amlodipine 10 mg daily Losartan 25 mg daily Metformin 500 BID  Not yet resumed - resume if and when appropriate  Other       Clinically significant medication issues were identified that warrant physician communication and completion of prescribed/recommended actions by midnight of the next day:  No  Pharmacist comments: None  Time spent performing this drug regimen review (minutes):  20 minutes  Thank you Okey Regal, PharmD

## 2023-06-02 NOTE — Plan of Care (Signed)
  Problem: Education: Goal: Knowledge of disease or condition will improve Outcome: Progressing Goal: Knowledge of secondary prevention will improve (MUST DOCUMENT ALL) Outcome: Progressing Goal: Knowledge of patient specific risk factors will improve Bradley Morrison N/A or DELETE if not current risk factor) Outcome: Progressing   Problem: Ischemic Stroke/TIA Tissue Perfusion: Goal: Complications of ischemic stroke/TIA will be minimized Outcome: Progressing   Problem: Coping: Goal: Will verbalize positive feelings about self Outcome: Progressing Goal: Will identify appropriate support needs Outcome: Progressing   Problem: Health Behavior/Discharge Planning: Goal: Ability to manage health-related needs will improve Outcome: Progressing Goal: Goals will be collaboratively established with patient/family Outcome: Progressing   Problem: Self-Care: Goal: Ability to participate in self-care as condition permits will improve Outcome: Progressing Goal: Verbalization of feelings and concerns over difficulty with self-care will improve Outcome: Progressing Goal: Ability to communicate needs accurately will improve Outcome: Progressing   Problem: Nutrition: Goal: Risk of aspiration will decrease Outcome: Progressing Goal: Dietary intake will improve Outcome: Progressing   Problem: Education: Goal: Knowledge of General Education information will improve Description: Including pain rating scale, medication(s)/side effects and non-pharmacologic comfort measures Outcome: Progressing   Problem: Health Behavior/Discharge Planning: Goal: Ability to manage health-related needs will improve Outcome: Progressing   Problem: Clinical Measurements: Goal: Ability to maintain clinical measurements within normal limits will improve Outcome: Progressing Goal: Will remain free from infection Outcome: Progressing Goal: Diagnostic test results will improve Outcome: Progressing Goal: Respiratory  complications will improve Outcome: Progressing Goal: Cardiovascular complication will be avoided Outcome: Progressing   Problem: Activity: Goal: Risk for activity intolerance will decrease Outcome: Progressing   Problem: Nutrition: Goal: Adequate nutrition will be maintained Outcome: Progressing   Problem: Coping: Goal: Level of anxiety will decrease Outcome: Progressing   Problem: Elimination: Goal: Will not experience complications related to bowel motility Outcome: Progressing Goal: Will not experience complications related to urinary retention Outcome: Progressing   Problem: Pain Managment: Goal: General experience of comfort will improve Outcome: Progressing   Problem: Safety: Goal: Ability to remain free from injury will improve Outcome: Progressing   Problem: Skin Integrity: Goal: Risk for impaired skin integrity will decrease Outcome: Progressing   Problem: Education: Goal: Ability to describe self-care measures that may prevent or decrease complications (Diabetes Survival Skills Education) will improve Outcome: Progressing Goal: Individualized Educational Video(s) Outcome: Progressing   Problem: Coping: Goal: Ability to adjust to condition or change in health will improve Outcome: Progressing   Problem: Fluid Volume: Goal: Ability to maintain a balanced intake and output will improve Outcome: Progressing   Problem: Health Behavior/Discharge Planning: Goal: Ability to identify and utilize available resources and services will improve Outcome: Progressing Goal: Ability to manage health-related needs will improve Outcome: Progressing   Problem: Metabolic: Goal: Ability to maintain appropriate glucose levels will improve Outcome: Progressing

## 2023-06-02 NOTE — Plan of Care (Signed)
  Problem: Education: Goal: Knowledge of disease or condition will improve Outcome: Progressing Goal: Knowledge of secondary prevention will improve (MUST DOCUMENT ALL) Outcome: Progressing Goal: Knowledge of patient specific risk factors will improve (Mark N/A or DELETE if not current risk factor) Outcome: Progressing   Problem: Ischemic Stroke/TIA Tissue Perfusion: Goal: Complications of ischemic stroke/TIA will be minimized Outcome: Progressing   Problem: Coping: Goal: Will verbalize positive feelings about self Outcome: Progressing Goal: Will identify appropriate support needs Outcome: Progressing   Problem: Health Behavior/Discharge Planning: Goal: Ability to manage health-related needs will improve Outcome: Progressing Goal: Goals will be collaboratively established with patient/family Outcome: Progressing   Problem: Self-Care: Goal: Ability to participate in self-care as condition permits will improve Outcome: Progressing Goal: Verbalization of feelings and concerns over difficulty with self-care will improve Outcome: Progressing Goal: Ability to communicate needs accurately will improve Outcome: Progressing   Problem: Nutrition: Goal: Risk of aspiration will decrease Outcome: Progressing Goal: Dietary intake will improve Outcome: Progressing   Problem: Education: Goal: Knowledge of General Education information will improve Description: Including pain rating scale, medication(s)/side effects and non-pharmacologic comfort measures Outcome: Progressing   Problem: Health Behavior/Discharge Planning: Goal: Ability to manage health-related needs will improve Outcome: Progressing   Problem: Clinical Measurements: Goal: Ability to maintain clinical measurements within normal limits will improve Outcome: Progressing Goal: Will remain free from infection Outcome: Progressing Goal: Diagnostic test results will improve Outcome: Progressing Goal: Respiratory  complications will improve Outcome: Progressing Goal: Cardiovascular complication will be avoided Outcome: Progressing   Problem: Activity: Goal: Risk for activity intolerance will decrease Outcome: Progressing   Problem: Nutrition: Goal: Adequate nutrition will be maintained Outcome: Progressing   Problem: Coping: Goal: Level of anxiety will decrease Outcome: Progressing   Problem: Elimination: Goal: Will not experience complications related to bowel motility Outcome: Progressing Goal: Will not experience complications related to urinary retention Outcome: Progressing   Problem: Pain Managment: Goal: General experience of comfort will improve Outcome: Progressing   Problem: Safety: Goal: Ability to remain free from injury will improve Outcome: Progressing   Problem: Skin Integrity: Goal: Risk for impaired skin integrity will decrease Outcome: Progressing   Problem: Education: Goal: Ability to describe self-care measures that may prevent or decrease complications (Diabetes Survival Skills Education) will improve Outcome: Progressing Goal: Individualized Educational Video(s) Outcome: Progressing   Problem: Coping: Goal: Ability to adjust to condition or change in health will improve Outcome: Progressing   Problem: Fluid Volume: Goal: Ability to maintain a balanced intake and output will improve Outcome: Progressing   Problem: Health Behavior/Discharge Planning: Goal: Ability to identify and utilize available resources and services will improve Outcome: Progressing Goal: Ability to manage health-related needs will improve Outcome: Progressing   Problem: Metabolic: Goal: Ability to maintain appropriate glucose levels will improve Outcome: Progressing   Problem: Nutritional: Goal: Maintenance of adequate nutrition will improve Outcome: Progressing Goal: Progress toward achieving an optimal weight will improve Outcome: Progressing   Problem: Skin  Integrity: Goal: Risk for impaired skin integrity will decrease Outcome: Progressing   Problem: Tissue Perfusion: Goal: Adequacy of tissue perfusion will improve Outcome: Progressing   

## 2023-06-02 NOTE — TOC Transition Note (Signed)
Transition of Care [facility]) - CM/SW Discharge Note   Patient Details  Name: Bradley Morrison MRN: [id number] Date of Birth: 1976/03/28  Transition of Care [facility] CM/SW Contact:  Margarito Liner, LCSW Phone Number: 06/02/2023, 11:28 AM   Clinical Narrative:  Patient has orders to discharge to [facility] today. Admissions coordinator arranged transport by [facility]. Transport paperwork is in his discharge packet. RN will call report. No further concerns. CSW signing off.   Final next level of care: IP Rehab Facility Barriers to Discharge: Barriers Resolved   Patient Goals and CMS Choice      Discharge Placement                  Patient to be transferred to facility by: [facility]   Patient and family notified of of transfer: 06/02/23  Discharge Plan and Services Additional resources added to the After Visit Summary for                                       Social Determinants of Health (SDOH) Interventions SDOH Screenings   Food Insecurity: No Food Insecurity (05/28/2023)  Housing: Low Risk  (05/28/2023)  Transportation Needs: No Transportation Needs (05/28/2023)  Utilities: Not At Risk (05/28/2023)  Financial Resource Strain: Low Risk  (05/19/2023)   Received from [facility]  Tobacco Use: High Risk (05/28/2023)     Readmission Risk Interventions     No data to display

## 2023-06-02 NOTE — Progress Notes (Signed)
Patient ID: Bradley Morrison, male   DOB: [birth year], 47 y.o.   MRN: [id number]  Patient family member brought patient's dog vaccination report to unit. Placed in patient chart.

## 2023-06-02 NOTE — H&P (Signed)
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 CTA head was negative for LVO or high grade stenosis. UDS positive for cannabinoids.  MRI brain showed multifocal acute ischemia ventral pons and Dr. Wilford Corner felt that patient with acute and subacute pontine infarct 2 weeks PTA with expansion now involving right pons as well as small vessel etiology. He recommended DAPT X 3 weeks followed by ASA alone. Hypercoagulable panel ordered.   He had worsening of symptoms on 08/11 and MRI brain showing abnormal diffusion more intense on left with increased T2 intensity and no new areas of involvement or new abnormality.  Dr. Wilford Corner felt that it was not uncommon for small vessel stroke to expand and get worse before they start to get better and to continue current management. To avoid hypotension and counsel of smoking cessation. MBS performed 08/12 revealing mild oral dysphagia with inconsistent aspiration of thins and chin tuck effective in eliminating aspiration.  On D2, thins with chin tuck and intermittent supervision. Therapy has been ongoing and patient is limited by left sided weakness with balance deficits, requires multiple rest breaks. He requires min to mod assist with ADLs and mobility. CIR recommended due to functional decline. Father asks what are potential causes of stroke.     Review of Systems  Constitutional:  Negative for fever.  HENT:  Positive for hearing loss.   Eyes:  Negative for blurred vision.  Respiratory:  Positive for cough (chronic). Negative for shortness of breath.   Cardiovascular:  Negative for chest pain.  Gastrointestinal:  Negative for abdominal pain.   Neurological:  Positive for speech change and focal weakness.     Past Medical History:  Diagnosis Date   Anxiety    CP (cerebral palsy) (HCC)    Hypertension    Stroke (HCC) 05/18/2023    History reviewed. No pertinent surgical history.   History reviewed. No pertinent family history.   Social History:   Used to live alone- disabled due to CP. Does some yard work on the side. He reports that he has been smoking cigarettes--1 PPD. He has never used smokeless tobacco. He reports that he does not drink alcohol anymore--does have history of excessive use in the past. UDS positive for THC.   :  Allergies  Allergen Reactions   Lisinopril Swelling    Lip swelling    Medications Prior to Admission  Medication Sig Dispense Refill   amLODipine (NORVASC) 10 MG tablet Take 1 tablet by mouth daily.     [START ON 06/03/2023] aspirin 81 MG chewable tablet Chew 1 tablet (81 mg total) by mouth daily. 30 tablet 0   [START ON 06/03/2023] atorvastatin (LIPITOR) 80 MG tablet Take 1 tablet (80 mg total) by mouth daily. 30 tablet 0   [START ON 06/03/2023] clopidogrel (PLAVIX) 75 MG tablet Take 1 tablet (75 mg total) by mouth daily for 15 days. 15 tablet 0   FARXIGA 10 MG TABS tablet Take 10 mg by mouth daily.     gabapentin (NEURONTIN) 300 MG capsule Take 300 mg by mouth 3 (three) times daily.     loperamide (IMODIUM A-D) 2 MG tablet Take 1 tablet (2  mg total) by mouth 4 (four) times daily as needed for diarrhea or loose stools. 12 tablet 0   losartan (COZAAR) 25 MG tablet Take 1 tablet by mouth daily.     metFORMIN (GLUCOPHAGE) 500 MG tablet Take 1,000 mg by mouth 2 (two) times daily.        Home: Home Living Family/patient expects to be discharged to:: Private residence Living Arrangements: Alone Available Help at Discharge: Family, Available 24 hours/day Type of Home: Other(Comment) (camper in a friend's yard) Home Access: Stairs to enter Secretary/administrator of Steps: 3 Entrance  Stairs-Rails: None Home Layout: One level Bathroom Shower/Tub: None Firefighter:  (none in camper.) Bathroom Accessibility:  (NA- no bathroom in camper) Home Equipment: Agricultural consultant (2 wheels)  Lives With: Alone   Functional History: Prior Function Prior Level of Function : Independent/Modified Independent Mobility Comments: 2 weeks ago, pt was IND with ambulation ADLs Comments: approx 2 weeks ago, pt MOD I-I in ADL/IADL, since previous stroke was amb with AD, dressing with MOD I, SET  UP for feeding/grooming, assist for IADLs   Functional Status:  Mobility: Bed Mobility Overal bed mobility: Modified Independent Bed Mobility: Supine to Sit Supine to sit: HOB elevated, Supervision General bed mobility comments: not tested Transfers Overall transfer level: Needs assistance Equipment used: None Transfers: Sit to/from Stand Sit to Stand: Contact guard assist General transfer comment: MIN A funcitonal mobility Ambulation/Gait Ambulation/Gait assistance: Mod assist Gait Distance (Feet): 90 Feet Assistive device: Rolling walker (2 wheels), None Gait Pattern/deviations: Decreased step length - right, Decreased step length - left, Decreased weight shift to left, Decreased stance time - left General Gait Details: occasional loss of balance, requiring minA from PT to recover. Gait velocity: decreased Stairs: Yes Stairs assistance: Min assist, Mod assist Stair Management: Two rails, Step to pattern Number of Stairs: 4 (6") General stair comments: PT provides demonstration & education re: compensatory pattern. Pt ascends stairs with B rails & min assist, min<>mod assist to safely descend stairs.   ADL: ADL Overall ADL's : Needs assistance/impaired Eating/Feeding: Set up, Supervision/ safety, Cueing for safety, Cueing for sequencing, Cueing for compensatory techinques, Sitting Eating/Feeding Details (indicate cue type and reason): OT facilitated carryover and training in use of  chin tuck for thin liquids (orders in place) with use of cup to chest and straw. Pt required supervision and intermittent VC for sequencing to ensure chin tuck. Educated pt/family in minimizing distractions and multitasking. RN provided lactuose (thicker) and pt demonstrated more difficulty with this, twice coughing to clear secretions. Chin tuck was utilized both times and pt able to clear secretions with coughing afterwards. Family member provided excellent cues for pt to support carryover of learned techniques. No difficulty holding cup with R or L hand. Grooming: Brushing hair, Sitting Grooming Details (indicate cue type and reason): with mirror assist, pt able to brush hair incorporating both hands into task to brush out tangles and pull hair back into ponytail. Increased difficulty and time to complete, decr coordination when leading with R hand but no direct assist required. Lower Body Bathing: Sit to/from stand, Minimal assistance Upper Body Dressing : Minimal assistance, Sitting Lower Body Dressing: Moderate assistance Lower Body Dressing Details (indicate cue type and reason): donn/doff socks, shorts seated on edge of bed Toilet Transfer: Rolling walker (2 wheels), Ambulation, Contact guard assist, Regular Toilet Toileting- Clothing Manipulation and Hygiene: Minimal assistance, Sit to/from stand Toileting - Clothing Manipulation Details (indicate cue type and reason): Min A for thoroughness in doffing shorts to  ensure shorts did not get caught on the edge of the toilet bowl prior to sitting. Pt requesting additional time to complete BM. Provided with call bell. Family member in room. RN notified. Functional mobility during ADLs: Minimal assistance, Moderate assistance, Rolling walker (2 wheels) General ADL Comments: MIN A toilet t/f no AD use. MOD A shower transfer, assist to stabilize stepping over shower lip. SBA seated LB bathing, CGA standing UB bathing with single UE support on rail.  Increased time don B socks, requires BUE assist to position LLE in figure 4 to complete.   Cognition: Cognition Overall Cognitive Status: Impaired/Different from baseline Orientation Level: Oriented X4 Cognition Arousal: Alert Behavior During Therapy: WFL for tasks assessed/performed Overall Cognitive Status: Impaired/Different from baseline Area of Impairment: Safety/judgement Safety/Judgement: Decreased awareness of safety Problem Solving: Requires verbal cues, Requires tactile cues General Comments: very motivated, VC for sequencing to support fluids intake while minimizing aspiration risk     Blood pressure 128/89, pulse 73, temperature 98.5 F (36.9 C), temperature source Oral, resp. rate 14, height 5\' 6"  (1.676 m), weight 90.9 kg, SpO2 95%.   Physical Exam Vitals and nursing note reviewed.  Constitutional:      Appearance: Normal appearance. HEENT: [place], AT  Pulmonary:     Effort: Pulmonary effort is normal.     Breath sounds: Rales present.     Comments: Congested cough with audible rhonchi and scattered rales.  Abdomen: NT, ND Psych: friendly and motivated Neurological:     Mental Status: He is alert.     Comments: Mild dysarthria. Able to follow simple motor commands. LLE weakness with ataxia (3/5). Strength is otherwise intact, sensation is intact  Results for orders placed or performed during the hospital encounter of 05/27/23 (from the past 48 hour(s))  Glucose, capillary     Status: None   Collection Time: 05/31/23  6:04 PM  Result Value Ref Range   Glucose-Capillary 96 70 - 99 mg/dL    Comment: Glucose reference range applies only to samples taken after fasting for at least 8 hours.  Glucose, capillary     Status: Abnormal   Collection Time: 05/31/23  8:35 PM  Result Value Ref Range   Glucose-Capillary 156 (H) 70 - 99 mg/dL    Comment: Glucose reference range applies only to samples taken after fasting for at least 8 hours.  CBC     Status: Abnormal    Collection Time: 06/01/23  3:30 AM  Result Value Ref Range   WBC 10.7 (H) 4.0 - 10.5 K/uL   RBC 5.12 4.22 - 5.81 MIL/uL   Hemoglobin 14.4 13.0 - 17.0 g/dL   HCT 16.1 09.6 - 04.5 %   MCV 87.7 80.0 - 100.0 fL   MCH 28.1 26.0 - 34.0 pg   MCHC 32.1 30.0 - 36.0 g/dL   RDW 40.9 81.1 - 91.4 %   Platelets 319 150 - 400 K/uL   nRBC 0.0 0.0 - 0.2 %    Comment: Performed at [facility], [address] [place], [place] [address]  Basic metabolic panel     Status: Abnormal   Collection Time: 06/01/23  3:30 AM  Result Value Ref Range   Sodium 137 135 - 145 mmol/L   Potassium 3.4 (L) 3.5 - 5.1 mmol/L   Chloride 103 98 - 111 mmol/L   CO2 25 22 - 32 mmol/L   Glucose, Bld 96 70 - 99 mg/dL    Comment: Glucose reference range applies only to samples taken after  fasting for at least 8 hours.   BUN 20 6 - 20 mg/dL   Creatinine, Ser 1.60 0.61 - 1.24 mg/dL   Calcium 9.0 8.9 - 10.9 mg/dL   GFR, Estimated >32 >35 mL/min    Comment: (NOTE) Calculated using the CKD-EPI Creatinine Equation (2021)    Anion gap 9 5 - 15    Comment: Performed at [facility], [address] [place], [place] [address]  Glucose, capillary     Status: Abnormal   Collection Time: 06/01/23  8:19 AM  Result Value Ref Range   Glucose-Capillary 105 (H) 70 - 99 mg/dL    Comment: Glucose reference range applies only to samples taken after fasting for at least 8 hours.  Glucose, capillary     Status: Abnormal   Collection Time: 06/01/23 12:06 PM  Result Value Ref Range   Glucose-Capillary 100 (H) 70 - 99 mg/dL    Comment: Glucose reference range applies only to samples taken after fasting for at least 8 hours.  Glucose, capillary     Status: None   Collection Time: 06/01/23  5:15 PM  Result Value Ref Range   Glucose-Capillary 99 70 - 99 mg/dL    Comment: Glucose reference range applies only to samples taken after fasting for at least 8 hours.  Glucose, capillary     Status: Abnormal   Collection Time:  06/01/23  9:23 PM  Result Value Ref Range   Glucose-Capillary 104 (H) 70 - 99 mg/dL    Comment: Glucose reference range applies only to samples taken after fasting for at least 8 hours.  CBC     Status: None   Collection Time: 06/02/23  2:57 AM  Result Value Ref Range   WBC 10.5 4.0 - 10.5 K/uL   RBC 5.03 4.22 - 5.81 MIL/uL   Hemoglobin 14.0 13.0 - 17.0 g/dL   HCT 02.5 42.7 - 06.2 %   MCV 86.9 80.0 - 100.0 fL   MCH 27.8 26.0 - 34.0 pg   MCHC 32.0 30.0 - 36.0 g/dL   RDW 37.6 28.3 - 15.1 %   Platelets 321 150 - 400 K/uL   nRBC 0.0 0.0 - 0.2 %    Comment: Performed at [facility], [address], [place], [place] [address]  Basic metabolic panel     Status: None   Collection Time: 06/02/23  2:57 AM  Result Value Ref Range   Sodium 137 135 - 145 mmol/L   Potassium 3.5 3.5 - 5.1 mmol/L   Chloride 103 98 - 111 mmol/L   CO2 25 22 - 32 mmol/L   Glucose, Bld 99 70 - 99 mg/dL    Comment: Glucose reference range applies only to samples taken after fasting for at least 8 hours.   BUN 20 6 - 20 mg/dL   Creatinine, Ser 7.37 0.61 - 1.24 mg/dL   Calcium 9.1 8.9 - 10.6 mg/dL   GFR, Estimated >26 >94 mL/min    Comment: (NOTE) Calculated using the CKD-EPI Creatinine Equation (2021)    Anion gap 9 5 - 15    Comment: Performed at [facility], [address] [place], [place] [address]  Glucose, capillary     Status: Abnormal   Collection Time: 06/02/23  8:49 AM  Result Value Ref Range   Glucose-Capillary 107 (H) 70 - 99 mg/dL    Comment: Glucose reference range applies only to samples taken after fasting for at least 8 hours.  Glucose, capillary  Status: Abnormal   Collection Time: 06/02/23 11:00 AM  Result Value Ref Range   Glucose-Capillary 126 (H) 70 - 99 mg/dL    Comment: Glucose reference range applies only to samples taken after fasting for at least 8 hours.   No results found.    Blood pressure 128/89, pulse 73, temperature 98.5 F (36.9 C), temperature  source Oral, resp. rate 14, height 5\' 6"  (1.676 m), weight 90.9 kg, SpO2 95%.  Medical Problem List and Plan: 1. 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   -patient may shower  -ELOS/Goals: 10-12 days S  Admit to CIR 2.  Antithrombotics: -DVT/anticoagulation:  Pharmaceutical: Lovenox  -antiplatelet therapy: DAPT X 3 weeks followed by ASA alone.  3. Diabetic neuropathy/Pain Management:  Continue gabapentin tid 4. Mood/Behavior/Sleep:  LCSW to follow for evaluation and support  --trazodone prn.  -antipsychotic agents: N/A 5. Neuropsych/cognition: This patient is not capable of making decisions on his own behalf. 6. Skin/Wound Care: Routine pressure relief measures.  7. Fluids/Electrolytes/Nutrition: Monitor I/O- check CMET in am. 8. HTN: Monitor BP TID- continue to hold amlodipine and hyzaar  --avoid hypotension/hypoperfusion -will add magnesium gluconate 250mg  HS. 9. T2DM: Better control with Hgb A1C-8.5( 02/24)-->6.5 @ admission. Monitor BS ac/hs  --SSI for elevated BS  --On farxiga daily 10. Intermittent hypokalemia: Recheck in am. 11. Constipation: continue colace and miralax.  12. Tobacco/Marijuana use: Educate on importance of cessation.  13. COPD?: I PPD smoker PTA. Congested cough with rales--will order CXR for work up. Incentive spirometer ordered.   14. Screening for vitamin D deficiency: check vitamin D level tomorrow.   I have personally performed a face to face diagnostic evaluation, including, but not limited to relevant history and physical exam findings, of this patient and developed relevant assessment and plan.  Additionally, I have reviewed and concur with the physician assistant's documentation above.  Jacquelynn Cree, PA-C   Horton Chin, MD 06/02/2023

## 2023-06-02 NOTE — Progress Notes (Signed)
Per Dr Mayford Knife, dc tele monitoring

## 2023-06-02 NOTE — Progress Notes (Addendum)
PMR Admission Coordinator Pre-Admission Assessment   Patient: Bradley Morrison is an 47 y.o., male MRN: [id number] DOB: Aug 12, 1976 Height: 5\' 6"  (167.6 cm) Weight: 95.9 kg   Insurance Information HMO: yes    PPO:      PCP:      IPA:      80/20:      OTHER:  PRIMARY: [employer]      Policy#: [id number]      Subscriber: patient CM Name: Karin Golden      Phone#: [phone]     Fax#: [phone] Baird Lyons with [employer] called 8/14 with Berkley Harvey 4/25-9/56 Pre-Cert#: [id number]      Employer:  Benefits:  Phone #: n/a-online at [email or web address]     Name:  Eff. Date: 10/19/22     Deduct: $240 ($240 met)      Out of Pocket Max: [phone] met)      Life Max: NA CIR: $2,080 co-pay/admission     SNF: 100% coverage for days 1-20, $203/day co-pay for days 21-100 Outpatient: 80% coverage     Co-Pay: 20% co-insurance Home Health: 100% coverage      Co-Pay:  DME: 80% coverage     Co-Pay: 20% co-insurance Providers: in-network SECONDARY: Medicaid [facility]       Policy#: [id number] n     Phone#: [phone]   Financial Counselor:       Phone#:    The "Data Collection Information Summary" for patients in Inpatient Rehabilitation Facilities with attached "Privacy Act Statement-Health Care Records" was provided and verbally reviewed with: Patient   Emergency Contact Information Contact Information       Name Relation Home Work Mobile    Bell,Cheryl Mother [phone]   [phone]    Glory Rosebush [phone]   [phone]         Other Contacts   None on File        Current Medical History  Patient Admitting Diagnosis: CVA History of Present Illness: Pt is a 47 year old male with medical hx significant for: CP, HTN, hyperlipidemia, DM, anxiety, right pontine stroke (week prior). Pt presented to [facility] on 05/27/23 d/t worsening speech deficits and new right-sided facial weakness. CT head showed no acute bleeding, midline shift or acute core  infarct. CTA negative for LVO. Pt not a candidate for tPA. Echo showed EF 55-60%. MRI showed multifocal ventral pontine infarct. Code stroke activated on 8/11. Repeat imaging was negative for acute abnormalities. Therapy evaluations completed and CIR recommended d/t pt's deficits in functional mobility, dysarthria and dysphagia. Complete NIHSS TOTAL: 3   Patient's medical record from [facility] has been reviewed by the rehabilitation admission coordinator and physician.   Past Medical History      Past Medical History:  Diagnosis Date   Anxiety     CP (cerebral palsy) (HCC)     Hypertension     Stroke [facility])            Has the patient had major surgery during 100 days prior to admission? No   Family History   family history is not on file.   Current Medications  Current Medications    Current Facility-Administered Medications:    acetaminophen (TYLENOL) tablet 650 mg, 650 mg, Oral, Q4H PRN, 650 mg at 05/31/23 0058 **OR** acetaminophen (TYLENOL) 160 MG/5ML solution 650 mg, 650 mg, Per Tube, Q4H PRN **OR** acetaminophen (TYLENOL) suppository 650 mg, 650 mg, Rectal, Q4H PRN, Mansy, Vernetta Honey, MD   aspirin EC tablet 81  mg, 81 mg, Oral, Daily, Milon Dikes, MD, 81 mg at 05/31/23 0935   atorvastatin (LIPITOR) tablet 80 mg, 80 mg, Oral, Daily, Mansy, Jan A, MD, 80 mg at 05/31/23 0935   clopidogrel (PLAVIX) tablet 75 mg, 75 mg, Oral, Daily, Mansy, Jan A, MD, 75 mg at 05/31/23 0935   docusate sodium (COLACE) capsule 200 mg, 200 mg, Oral, BID, Fabienne Bruns M, MD, 200 mg at 05/31/23 0935   enoxaparin (LOVENOX) injection 50 mg, 50 mg, Subcutaneous, Q24H, Mansy, Jan A, MD, 50 mg at 05/31/23 0936   guaiFENesin-dextromethorphan (ROBITUSSIN DM) 100-10 MG/5ML syrup 5 mL, 5 mL, Oral, Q4H PRN, [name], MD, 5 mL at 05/30/23 2312   insulin aspart (novoLOG) injection 0-6 Units, 0-6 Units, Subcutaneous, TID WC, [name], MD, 1 Units at 05/29/23 1239    lactulose (CHRONULAC) 10 GM/15ML solution 30 g, 30 g, Oral, Daily PRN, [name], MD, 30 g at 05/31/23 1056   LORazepam (ATIVAN) tablet 0.5 mg, 0.5 mg, Oral, Q4H PRN, Mansy, Jan A, MD, 0.5 mg at 05/31/23 0325   morphine (PF) 2 MG/ML injection 1 mg, 1 mg, Intravenous, Q3H PRN, [name], MD, 1 mg at 05/28/23 0943   [START ON 06/01/2023] polyethylene glycol (MIRALAX / GLYCOLAX) packet 17 g, 17 g, Oral, Daily, Mayford Knife, Jamiese M, MD   senna-docusate (Senokot-S) tablet 1 tablet, 1 tablet, Oral, QHS PRN, Mansy, Vernetta Honey, MD     Patients Current Diet:  Diet Order                  DIET DYS 2 Room service appropriate? Yes; Fluid consistency: Thin  Diet effective now                         Precautions / Restrictions Precautions Precautions: Fall Precaution Comments: aspiration Restrictions Weight Bearing Restrictions: No    Has the patient had 2 or more falls or a fall with injury in the past year? No   Prior Activity Level Community (5-7x/wk): drives, gets out of house often   Prior Functional Level Self Care: Did the patient need help bathing, dressing, using the toilet or eating? Independent   Indoor Mobility: Did the patient need assistance with walking from room to room (with or without device)? Independent   Stairs: Did the patient need assistance with internal or external stairs (with or without device)? Independent   Functional Cognition: Did the patient need help planning regular tasks such as shopping or remembering to take medications? Independent   Patient Information Are you of Hispanic, Latino/a,or Spanish origin?: A. No, not of Hispanic, Latino/a, or Spanish origin What is your race?: A. White Do you need or want an interpreter to communicate with a doctor or health care staff?: 0. No   Patient's Response To:  Health Literacy and Transportation Is the patient able to respond to health literacy and transportation needs?: Yes Health Literacy -  How often do you need to have someone help you when you read instructions, pamphlets, or other written material from your doctor or pharmacy?: Never In the past 12 months, has lack of transportation kept you from medical appointments or from getting medications?: No In the past 12 months, has lack of transportation kept you from meetings, work, or from getting things needed for daily living?: No   Home Assistive Devices / Equipment Home Assistive Devices/Equipment: Environmental consultant (specify type), Eyeglasses Home Equipment: Rolling Walker (2 wheels)   Prior Device Use:  Indicate devices/aids used by the patient prior to current illness, exacerbation or injury? None of the above   Current Functional Level Cognition   Overall Cognitive Status: Within Functional Limits for tasks assessed Orientation Level: Oriented X4 General Comments: very motivated, VC for sequencing to support fluids intake while minimizing aspiration risk    Extremity Assessment (includes Sensation/Coordination)   Upper Extremity Assessment: Overall WFL for tasks assessed, LUE deficits/detail, RUE deficits/detail RUE Coordination: decreased fine motor  Lower Extremity Assessment: LLE deficits/detail, RLE deficits/detail, Defer to PT evaluation (+ clonus LLE) RLE Deficits / Details: MMT gross 4-5/5 RLE Sensation: WNL LLE Deficits / Details: L hip flexion 3/5, L knee ext 3+/5 LLE Sensation: WNL LLE Coordination: decreased gross motor (Unable to maintain alternate heel tapping with increased speed)     ADLs   Overall ADL's : Needs assistance/impaired Eating/Feeding: Set up, Supervision/ safety, Cueing for safety, Cueing for sequencing, Cueing for compensatory techinques, Sitting Eating/Feeding Details (indicate cue type and reason): OT facilitated carryover and training in use of chin tuck for thin liquids (orders in place) with use of cup to chest and straw. Pt required supervision and intermittent VC for sequencing to ensure chin  tuck. Educated pt/family in minimizing distractions and multitasking. RN provided lactuose (thicker) and pt demonstrated more difficulty with this, twice coughing to clear secretions. Chin tuck was utilized both times and pt able to clear secretions with coughing afterwards. Family member provided excellent cues for pt to support carryover of learned techniques. No difficulty holding cup with R or L hand. Grooming: Brushing hair, Sitting Grooming Details (indicate cue type and reason): with mirror assist, pt able to brush hair incorporating both hands into task to brush out tangles and pull hair back into ponytail. Increased difficulty and time to complete, decr coordination when leading with R hand but no direct assist required. Lower Body Bathing: Sit to/from stand, Minimal assistance Upper Body Dressing : Minimal assistance, Sitting Lower Body Dressing: Moderate assistance Lower Body Dressing Details (indicate cue type and reason): donn/doff socks, shorts seated on edge of bed Toilet Transfer: Rolling walker (2 wheels), Ambulation, Contact guard assist, Regular Toilet Toileting- Clothing Manipulation and Hygiene: Minimal assistance, Sit to/from stand Toileting - Clothing Manipulation Details (indicate cue type and reason): Min A for thoroughness in doffing shorts to ensure shorts did not get caught on the edge of the toilet bowl prior to sitting. Pt requesting additional time to complete BM. Provided with call bell. Family member in room. RN notified. Functional mobility during ADLs: Minimal assistance, Moderate assistance, Rolling walker (2 wheels)     Mobility   Overal bed mobility: Needs Assistance Bed Mobility: Supine to Sit Supine to sit: HOB elevated, Supervision General bed mobility comments: NT     Transfers   Overall transfer level: Needs assistance Equipment used: Rolling walker (2 wheels) Transfers: Sit to/from Stand Sit to Stand: Contact guard assist, Min assist General transfer  comment: Requires extra time to achieve full upright standing and maintain balance     Ambulation / Gait / Stairs / Wheelchair Mobility   Ambulation/Gait Ambulation/Gait assistance: Mod assist Gait Distance (Feet): 200 Feet (160+40) Assistive device: Rolling walker (2 wheels), None Gait Pattern/deviations: Step-through pattern, Decreased step length - left, Decreased stance time - left, Shuffle, Knee hyperextension - left General Gait Details: Multiple standing breaks needed to regain balance, difficulty with turns (LOB requiring mod-maxA from PT to recover), improved clonus during functional activities, B/L lateral lean to assist with weight shifting/ foot clearance Gait velocity:  decreased Stairs: Yes Stairs assistance: Min assist, Mod assist Stair Management: Two rails, Step to pattern Number of Stairs: 4 (6") General stair comments: PT provides demonstration & education re: compensatory pattern. Pt ascends stairs with B rails & min assist, min<>mod assist to safely descend stairs.     Posture / Balance Balance Overall balance assessment: Needs assistance Sitting-balance support: Feet supported, No upper extremity supported Sitting balance-Leahy Scale: Good Standing balance support: Bilateral upper extremity supported, During functional activity, Reliant on assistive device for balance Standing balance-Leahy Scale: Fair Standing balance comment: Pt required use of bathroom promptly, RW utilized to maximize balance for task     Special needs/care consideration Diabetic management Novolog 0-6 units  3x daily with meals    Previous Home Environment (from acute therapy documentation) Living Arrangements: Alone  Lives With: Alone Available Help at Discharge: Family, Available 24 hours/day Type of Home: Other(Comment) (camper in a friend's yard) Home Layout: One level Home Access: Stairs to enter Entrance Stairs-Rails: None Secretary/administrator of Steps: 3 Bathroom Shower/Tub:  None Firefighter:  (none in camper.) Bathroom Accessibility:  (NA- no bathroom in camper) Home Care Services: No   Discharge Living Setting Plans for Discharge Living Setting: House (mother's house) Type of Home at Discharge: House Discharge Home Layout: One level (has a basement) Discharge Home Access: Stairs to enter Entrance Stairs-Rails: Left Entrance Stairs-Number of Steps: 5 Discharge Bathroom Shower/Tub: Tub/shower unit Discharge Bathroom Toilet: Handicapped height Discharge Bathroom Accessibility: No Does the patient have any problems obtaining your medications?: No   Social/Family/Support Systems Anticipated Caregiver: Eino Farber, mother Anticipated Caregiver's Contact Information: [phone] Caregiver Availability: 24/7 Discharge Plan Discussed with Primary Caregiver: Yes Is Caregiver In Agreement with Plan?: Yes Does Caregiver/Family have Issues with Lodging/Transportation while Pt is in Rehab?: No   Goals Patient/Family Goal for Rehab: Supervision:PT/OT,  Min A:ST Expected length of stay: 12-14 dyas Pt/Family Agrees to Admission and willing to participate: Yes Program Orientation Provided & Reviewed with Pt/Caregiver Including Roles  & Responsibilities: Yes   Decrease burden of Care through IP rehab admission: NA   Possible need for SNF placement upon discharge: Not anticipated   Patient Condition: I have reviewed medical records from [facility], spoken with CSW, and patient and family member. I discussed via phone for inpatient rehabilitation assessment.  Patient will benefit from ongoing PT, OT, and SLP, can actively participate in 3 hours of therapy a day 5 days of the week, and can make measurable gains during the admission.  Patient will also benefit from the coordinated team approach during an Inpatient Acute Rehabilitation admission.  The patient will receive intensive therapy as well as Rehabilitation physician, nursing, social  worker, and care management interventions.  Due to safety, disease management, medication administration, pain management, and patient education the patient requires 24 hour a day rehabilitation nursing.  The patient is currently min-mod A with mobility and basic ADLs.  Discharge setting and therapy post discharge at home with home health is anticipated.  Patient has agreed to participate in the Acute Inpatient Rehabilitation Program and will admit today.   Preadmission Screen Completed By:  Domingo Pulse, 05/31/2023 1:32 PM ______________________________________________________________________   Discussed status with Dr. Carlis Abbott  on 06/02/23 930 and received approval for admission today.   Admission Coordinator:  Domingo Pulse, CCC-SLP, time 1110/Date 06/02/23    Assessment/Plan: Diagnosis: Acute ischemic stroke Does the need for close, 24 hr/day Medical supervision in concert with the patient's rehab needs make  it unreasonable for this patient to be served in a less intensive setting? Yes Co-Morbidities requiring supervision/potential complications: obesity, hypertension, dyslipidemia, type 2 diabetes, marijuana abuse, OSA Due to bladder management, bowel management, safety, skin/wound care, disease management, medication administration, pain management, and patient education, does the patient require 24 hr/day rehab nursing? Yes Does the patient require coordinated care of a physician, rehab nurse, PT, OT, and SLP to address physical and functional deficits in the context of the above medical diagnosis(es)? Yes Addressing deficits in the following areas: balance, endurance, locomotion, strength, transferring, bowel/bladder control, bathing, dressing, feeding, grooming, toileting, cognition, and psychosocial support Can the patient actively participate in an intensive therapy program of at least 3 hrs of therapy 5 days a week? Yes The potential for patient to make measurable gains  while on inpatient rehab is excellent Anticipated functional outcomes upon discharge from inpatient rehab: supervision PT, supervision OT, supervision SLP Estimated rehab length of stay to reach the above functional goals is: 10-12 days Anticipated discharge destination: Home 10. Overall Rehab/Functional Prognosis: excellent     MD Signature: Sula Soda, MD

## 2023-06-02 NOTE — Progress Notes (Signed)
Occupational Therapy Treatment Patient Details Name: Bradley Morrison MRN: [id number] DOB: [birth year] Today's Date: 06/02/2023   History of present illness Pt is a 47 y/o M admitted on 05/27/23 after presenting with c/o acute onset of worsening dysarthria & R facial droop. Pt recently admitted to [facility] & found to have R pontine stroke. During this admission pt found to have acute & subacute pontine infarction (expansion of stroke that initially happened <2 weeks ago, initially involving R pons but now involving L pons as well). Overnight on 8/10 pt with worsening symptoms & MRI showed more prominent areas in B pons compared to prior imaging from a day ago. PMH: hypertension, type 2 diabetes mellitus, anxiety, Cerebral palsy   OT comments  Bradley Morrison was seen for OT treatment on this date. Upon arrival to room pt seated EOB, agreeable to tx. Pt requires MIN A functional mobility ~10 ft x 10 ft x 10 ft with no AD use, seated rest breaks, cues to clear L foot - 2 moderate lateral LOBs requiring assist to correct. Tolerated 10 min standing functional reaching task reaching outside BOS to tap sticky notes on wall at above head height. MOD A to prevent LOB reaching across midline to L side and reaching outside BOS on L side.   MOD A shower transfer, assist to stabilize stepping over shower lip. SBA seated LB bathing, CGA standing UB bathing with single UE support on rail. Increased time don B socks, requires BUE assist to position LLE in figure 4 to complete. Pt making good progress toward goals, will continue to follow POC. Discharge recommendation remains appropriate.        If plan is discharge home, recommend the following:  A little help with walking and/or transfers;A little help with bathing/dressing/bathroom;Help with stairs or ramp for entrance;Direct supervision/assist for medications management;Assist for transportation;Assistance with cooking/housework   Equipment Recommendations  BSC/3in1     Recommendations for Other Services      Precautions / Restrictions Precautions Precautions: Fall Precaution Comments: aspiration Restrictions Weight Bearing Restrictions: No       Mobility Bed Mobility               General bed mobility comments: not tested    Transfers Overall transfer level: Needs assistance Equipment used: None Transfers: Sit to/from Stand Sit to Stand: Contact guard assist           General transfer comment: MIN A funcitonal mobility     Balance Overall balance assessment: Needs assistance Sitting-balance support: Feet supported, No upper extremity supported Sitting balance-Leahy Scale: Good     Standing balance support: No upper extremity supported, During functional activity Standing balance-Leahy Scale: Fair Standing balance comment: minor LOBs reaching outside BOS, MIN A with no UE support                           ADL either performed or assessed with clinical judgement   ADL Overall ADL's : Needs assistance/impaired                                       General ADL Comments: MIN A toilet t/f no AD use. MOD A shower transfer, assist to stabilize stepping over shower lip. SBA seated LB bathing, CGA standing UB bathing with single UE support on rail. Increased time don B socks, requires BUE assist to position LLE in  figure 4 to complete.      Cognition Arousal: Alert Behavior During Therapy: WFL for tasks assessed/performed Overall Cognitive Status: Impaired/Different from baseline Area of Impairment: Safety/judgement                         Safety/Judgement: Decreased awareness of safety                         Pertinent Vitals/ Pain       Pain Assessment Pain Assessment: No/denies pain   Frequency  Min 1X/week        Progress Toward Goals  OT Goals(current goals can now be found in the care plan section)     Acute Rehab OT Goals Patient Stated Goal: to return to  PLOF OT Goal Formulation: With patient/family Time For Goal Achievement: 06/11/23 Potential to Achieve Goals: Good   AM-PAC OT "6 Clicks" Daily Activity     Outcome Measure   Help from another person eating meals?: A Little Help from another person taking care of personal grooming?: None Help from another person toileting, which includes using toliet, bedpan, or urinal?: A Little Help from another person bathing (including washing, rinsing, drying)?: A Little Help from another person to put on and taking off regular upper body clothing?: A Little Help from another person to put on and taking off regular lower body clothing?: A Lot 6 Click Score: 18    End of Session    OT Visit Diagnosis: Unsteadiness on feet (R26.81);Other abnormalities of gait and mobility (R26.89);Muscle weakness (generalized) (M62.81);Other symptoms and signs involving the nervous system (R29.898)   Activity Tolerance Patient tolerated treatment well   Patient Left in bed;with call bell/phone within reach;with family/visitor present;with nursing/sitter in room   Nurse Communication          Time: 2956-2130 OT Time Calculation (min): 55 min  Charges: OT General Charges $OT Visit: 1 Visit OT Treatments $Self Care/Home Management : 38-52 mins $Therapeutic Activity: 8-22 mins  Kathie Dike, M.S. OTR/L  06/02/23, 10:14 AM  ascom [phone]

## 2023-06-02 NOTE — Progress Notes (Signed)
Inpatient Rehab Admissions Coordinator:    Pt. To admit to CIR today. I will arrange transport with [facility].  Pt. In agreement to d/c to CIR to participate in intensive rehab for 10-12 days with the goal of reaching supervision level and returning home with his mother for support.   Megan Salon, MS, CCC-SLP Rehab Admissions Coordinator  [phone] (celll) [phone] (office)

## 2023-06-02 NOTE — Progress Notes (Signed)
Patient ID: Bradley Morrison, male   DOB: Jun 03, 1976, 47 y.o.   MRN: [id number]  INPATIENT REHABILITATION ADMISSION NOTE   Arrival Method: [facility]     Mental Orientation: x4   Assessment: see flowsheet   Skin: CDI   IV'S: N/A   Pain: none reported   Tubes and Drains: n/a   Safety Measures: in place   Vital Signs: see flowsheet   Height and Weight: see flowsheet   Rehab Orientation: completed   Family: at bedside

## 2023-06-02 NOTE — Discharge Summary (Signed)
Physician Discharge Summary  Bradley Morrison UJW:[id number] DOB: 10/19/1976 DOA: 05/27/2023  PCP: [facility]  Admit date: 05/27/2023 Discharge date: 06/02/2023  Admitted From: Home  Disposition:  inpatient rehab   Recommendations for Outpatient Follow-up:  Follow up with PCP in 1-2 weeks F/u w/ neuro, Dr. Malvin Johns or Dr. Sherryll Burger, in 4-6 weeks  Home Health: no  Equipment/Devices:  Discharge Condition: stable  CODE STATUS: full Diet recommendation: Dysphagia II: carb modified & heart healthy   Brief/Interim Summary: HPI was taken from Dr. Arville Care: Bradley Morrison is a 47 y.o. 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  He had headache without dizziness or blurred vision.  No tinnitus or vertigo.  No urinary or stool incontinence.  No weakness seizures.  No chest pain or palpitations.  No fever or chills.  No nausea or vomiting or abdominal pain.  No dysuria, oliguria, urinary frequency or urgency or flank pain.   ED Course: Upon presentation to the emergency room, BP was 160/96 with temperature 97.5 with otherwise normal vital signs.  Labs revealed unremarkable CMP.  CBC showed leukocytosis 12.4 with neutrophilia.  UA was negative.  Urine drug screen was positive for cannabinoids. EKG as reviewed by me : EKG showed sinus rhythm with a rate of 86 with nonspecific interventricular conduction delay and inferior Q waves. Imaging: Code stroke CT revealed old no acute intracranial abnormalities.  CTA of the head and neck revealed normal CT perfusion with no emergent large vessel occlusion or high-grade stenosis of the intracranial arteries.   The patient was not considered a candidate for tPA per teleneurology consult due to recent CVA.  He will be  admitted to an observation medical telemetry [facility] for evaluation and management.   Discharge Diagnoses:  Principal Problem:   Acute ischemic stroke [facility]) Active Problems:   Dyslipidemia   Type 2 diabetes mellitus with peripheral neuropathy (HCC)   Essential hypertension   Marijuana abuse   CVA (cerebral vascular accident) (HCC)  Acute ischemic CVA: continue on aspirin, plavix x 3 weeks & then just aspirin thereafter as per neuro. Continue on statin. Continue w/ neuro checks. Repeat MRI brain shows bilateral lower pontine abnormal diffusion, now more intense on the left side versus two days ago. Increased T2 hyperintensity with no hemorrhage or mass effect. Neuro was aware. Echo shows EF 55-60%, grade I diastolic dysfunction & no intraatrial shunts identified. Neuro recs apprec  Dysphagia: likely secondary to acute CVA. Continue on dysphagia II diet as per speech    HLD: continue on statin    DM2: HbA1c 6.5, well controlled. Continue on home dose of farxiga, metformin at d/c     HTN: BP goal around 140/90 & avoid hypotension as per neuro. Can restart home dose of losartan, amlodipine    Marijuana abuse: received marijuana cessation counseling already   Discharge Instructions  Discharge Instructions     Diet - low sodium heart healthy   Complete by: As directed    Dysphagia II diet   Diet Carb Modified   Complete by: As directed    Dysphagia II diet   Discharge instructions   Complete by: As directed    F/u w/ PCP in 1-2 weeks. F/u w/ neuro, Dr. Sherryll Burger or Dr. Malvin Johns, in 4-6 weeks   Increase activity  slowly   Complete by: As directed       Allergies as of 06/02/2023       Reactions   Lisinopril Swelling   Lip swelling        Medication List     STOP taking these medications    citalopram 40 MG tablet Commonly known as: CELEXA   dicyclomine 20 MG tablet Commonly known as: BENTYL   losartan-hydrochlorothiazide 50-12.5 MG tablet Commonly known as: HYZAAR    ondansetron 4 MG disintegrating tablet Commonly known as: ZOFRAN-ODT   predniSONE 10 MG (21) Tbpk tablet Commonly known as: STERAPRED UNI-PAK 21 TAB       TAKE these medications    amLODipine 10 MG tablet Commonly known as: NORVASC Take 1 tablet by mouth daily. What changed: Another medication with the same name was removed. Continue taking this medication, and follow the directions you see here.   aspirin 81 MG chewable tablet Chew 1 tablet (81 mg total) by mouth daily. Start taking on: June 03, 2023   atorvastatin 80 MG tablet Commonly known as: LIPITOR Take 1 tablet (80 mg total) by mouth daily. Start taking on: June 03, 2023 What changed:  medication strength how much to take Another medication with the same name was removed. Continue taking this medication, and follow the directions you see here.   clopidogrel 75 MG tablet Commonly known as: PLAVIX Take 1 tablet (75 mg total) by mouth daily for 15 days. Start taking on: June 03, 2023   Farxiga 10 MG Tabs tablet Generic drug: dapagliflozin propanediol Take 10 mg by mouth daily.   gabapentin 300 MG capsule Commonly known as: NEURONTIN Take 300 mg by mouth 3 (three) times daily.   loperamide 2 MG tablet Commonly known as: Imodium A-D Take 1 tablet (2 mg total) by mouth 4 (four) times daily as needed for diarrhea or loose stools.   losartan 25 MG tablet Commonly known as: COZAAR Take 1 tablet by mouth daily.   metFORMIN 500 MG tablet Commonly known as: GLUCOPHAGE Take 1,000 mg by mouth 2 (two) times daily.        Allergies  Allergen Reactions   Lisinopril Swelling    Lip swelling    Consultations: Neuro    Procedures/Studies: DG Swallowing Func-Speech Pathology  Result Date: 05/31/2023 Table formatting from the original result was not included. Modified Barium Swallow Study Patient Details Name: Bradley Morrison MRN: [id number] Date of Birth: 1975/11/29 Today's Date: 05/31/2023 HPI/PMH: HPI:  Pt is a 47 year old male with history of hypertension, hyperlipidemia, diabetes mellitus, cerebral palsy (mainly with some foot problems/deformity), recent Right Pontine stroke in the past week(05/18/23), cared for at [facility] with some residual left-sided weakness and minor speech disturbance; pt denied any difficulty swallowing, only min slower chewing.  Per MD admit note, "apparently this evening around 8 PM, he had worsened speech, mainly increased slurred speech. He also has Right facial weakness. Unclear if his left side is weaker than previously. Apparently symptoms resolved, he went to bed at 9 PM and woke up later with recurrent similar symptoms with the Right facial weakness, altered speech and difficulty swallowing.   On examination, patient has moderate to severe Dysarthria which is definitely worse than previously according to family as well as new right facial weakness. He does have significant left leg weakness, unclear if this is worse than previously. He also has diminished in station of the left limbs. NIHSS = 7. Most recent MRI brain, 8/11, "1. Bilateral lower pontine abnormal  diffusion, now more intense on  the left side versus two days ago. Increased T2 hyperintensity with  no hemorrhage or mass effect.  No new areas of involvement.  Small-vessel ischemic etiology is possible. Other etiologies of  symmetric brain diffusion abnormality were considered (such as toxic  exposure or myelinosis), but seem unlikely given the pattern here." Clinical Impression: Clinical Impression: Pt presents with a mild oral dysphagia. Pt with delayed swallow initiation and mistimed/reduced laryngeal vesibule closure which attributed to before the swallow penetration and aspiration of thin liquids. Aspiration was inconsistently audible due to reduced laryngeal sensation. Oral phase notable for prolonged mastication and delayed lingual motion due to lingual weakness/incoorindation. Chin tuck was effective in eliminating  aspiration, but not penetration, with thin liquids via cup and straw. Penetration with a chin thuck occured before/during the swallow, transient in nature, and cleared with laryngeal vestibule closure. Recommend a Dysphagia 2 Diet with Thin Liquids with safe swallowing strategies/aspiration precautions including, but not limited to, chin tuck with all liquid intake. SLP to f/u per POC for diet tolerance, dysphagia management, and dysarthria tx.  Pt educated immediately following MBSS re: diet recommendations, safe swallowing strategies/aspiration precautions, rationale for diet recommendations/safe swallowing strategies, and SLP POC. Pt shown videofluoroscopic images for reinforcement of content. Factors that may increase risk of adverse event in presence of aspiration Bradley Morrison & Bradley Morrison 2021): Factors that may increase risk of adverse event in presence of aspiration Bradley Morrison & Bradley Morrison 2021): Aspiration of thick, dense, and/or acidic materials (recent stroke) Recommendations/Plan: Swallowing Evaluation Recommendations Swallowing Evaluation Recommendations Recommendations: PO diet PO Diet Recommendation: Dysphagia 2 (Finely chopped); Thin liquids (Level 0) Liquid Administration via: Spoon; Cup; Straw Medication Administration: Crushed with puree Supervision: Patient able to self-feed; Intermittent supervision/cueing for swallowing strategies Swallowing strategies  : Slow rate; Small bites/sips; Chin tuck Postural changes: Position pt fully upright for meals; Stay upright 30-60 min after meals; Out of bed for meals Oral care recommendations: Oral care BID (2x/day); Pt independent with oral care Treatment Plan Treatment Plan Treatment recommendations: Therapy as outlined in treatment plan below Follow-up recommendations: Acute inpatient rehab (3 hours/day) Functional status assessment: Patient has had a recent decline in their functional status and demonstrates the ability to make significant improvements in function in  a reasonable and predictable amount of time. Treatment frequency: Min 2x/week Treatment duration: 2 weeks Interventions: Aspiration precaution training; Oropharyngeal exercises; Compensatory techniques; Patient/family education; Trials of upgraded texture/liquids; Diet toleration management by SLP Recommendations Recommendations for follow up therapy are one component of a multi-disciplinary discharge planning process, led by the attending physician.  Recommendations may be updated based on patient status, additional functional criteria and insurance authorization. Assessment: Orofacial Exam: Orofacial Exam Oral Cavity: Oral Hygiene: WFL Oral Cavity - Dentition: Adequate natural dentition Orofacial Anatomy: WFL Oral Motor/Sensory Function: Suspected cranial nerve impairment CN V - Trigeminal: Right motor impairment CN IX - Glossopharyngeal, CN X - Vagus: Right motor impairment CN XII - Hypoglossal: Right motor impairment Anatomy: Anatomy: WFL Boluses Administered: Boluses Administered Boluses Administered: Thin liquids (Level 0); Puree; Solid  Oral Impairment Domain: Oral Impairment Domain Lip Closure: No labial escape Tongue control during bolus hold: Cohesive bolus between tongue to palatal seal Bolus preparation/mastication: Slow prolonged chewing/mashing with complete recollection Bolus transport/lingual motion: Slow tongue motion Oral residue: Trace residue lining oral structures Initiation of pharyngeal swallow : Posterior angle of the ramus; Valleculae  Pharyngeal Impairment Domain: Pharyngeal Impairment Domain Soft palate elevation: No bolus between soft palate (SP)/pharyngeal wall (PW) Laryngeal elevation: Complete  superior movement of thyroid cartilage with complete approximation of arytenoids to epiglottic petiole Anterior hyoid excursion: Complete anterior movement Epiglottic movement: Complete inversion Laryngeal vestibule closure: Incomplete, narrow column air/contrast in laryngeal vestibule Pharyngeal  stripping wave : Present - complete Pharyngeal contraction (A/P view only): N/A Pharyngoesophageal segment opening: Complete distension and complete duration, no obstruction of flow Tongue base retraction: No contrast between tongue base and posterior pharyngeal wall (PPW) Pharyngeal residue: Complete pharyngeal Bradley  Esophageal Impairment Domain: Esophageal Impairment Domain Esophageal Bradley upright position: -- (NA) Pill: No data recorded Penetration/Aspiration Scale Score: Penetration/Aspiration Scale Score 1.  Material does not enter airway: Puree; Solid 2.  Material enters airway, remains ABOVE vocal cords then ejected out: Thin liquids (Level 0) 3.  Material enters airway, remains ABOVE vocal cords and not ejected out: Thin liquids (Level 0) 6.  Material enters airway, passes BELOW cords then ejected out: Thin liquids (Level 0) 7.  Material enters airway, passes BELOW cords and not ejected out despite cough attempt by patient: Thin liquids (Level 0) 8.  Material enters airway, passes BELOW cords without attempt by patient to eject out (silent aspiration) : Thin liquids (Level 0) Compensatory Strategies: Compensatory Strategies Compensatory strategies: Yes Straw: Effective (with chin tuck) Effective Straw: Thin liquid (Level 0) Chin tuck: Effective Effective Chin Tuck: Thin liquid (Level 0)   General Information: Caregiver present: No  Diet Prior to this Study: Dysphagia 2 (finely chopped); Extremely thick liquids (Level 4, pudding thick)   Temperature : Normal   Respiratory Status: WFL   Supplemental O2: None (Room air)   History of Recent Intubation: No  Behavior/Cognition: Alert; Cooperative; Pleasant mood (notable dysarthria) Self-Feeding Abilities: Able to self-feed Baseline vocal quality/speech: Hypophonia/low volume; Abnormal resonance (intermittent hypernasality) Volitional Cough: Able to elicit Volitional Swallow: Able to elicit No data recorded Goal Planning: Prognosis for improved  oropharyngeal function: Fair Barriers to Reach Goals: Overall medical prognosis No data recorded Patient/Family Stated Goal: "[employer]" Consulted and agree with results and recommendations: Patient Pain: Pain Assessment Pain Assessment: No/denies pain End of Session: Start Time:SLP Start Time (ACUTE ONLY): 0815 Stop Time: SLP Stop Time (ACUTE ONLY): 0835 Time Calculation:SLP Time Calculation (min) (ACUTE ONLY): 20 min Charges: SLP Evaluations $ SLP Speech Visit: 1 Visit SLP Evaluations $MBS Swallow: 1 Procedure SLP visit diagnosis: SLP Visit Diagnosis: Dysphagia, oropharyngeal phase (R13.12) Past Medical History: Past Medical History: Diagnosis Date  Anxiety   CP (cerebral palsy) (HCC)   Hypertension   Stroke [facility])  Past Surgical History: No past surgical history on file. Bradley Morrison, M.S., CCC-SLP Speech-Language Pathologist [facility] [phone] Arnette Felts) Woodroe Chen 05/31/2023, 9:18 AM  MR BRAIN WO CONTRAST  Result Date: 05/30/2023 CLINICAL DATA:  47 year old male recurrent code stroke presentation. Recent small bilateral brainstem infarcts, by report initially on the right side and 1st diagnosed and treated at [facility] system. EXAM: MRI HEAD WITHOUT CONTRAST TECHNIQUE: Multiplanar, multiecho pulse sequences of the brain and surrounding structures were obtained without intravenous contrast. COMPARISON:  Head CT 0059 hours today. Previous brain MRI 05/28/2023. CTA head and neck 05/28/2023. FINDINGS: Brain: Ongoing restricted diffusion in the lower pons near the pontomedullary junction, bilateral and nearly symmetric on series 9, image 11. Currently the left of midline involvement appears more intense on DWI (series 9, image 11) versus 2 days ago. Corresponding T2 hyperintensity has developed although FLAIR hyperintensity remains fairly mild. No hemorrhage or mass effect. No other diffusion restriction. And no other areas of convincing abnormal  diffusion although  on coronal imaging mildly abnormal diffusion in both mesial temporal lobes was considered (series 7, image 20). But not correlated on T2 or FLAIR, and isointense on ADC. No superimposed midline shift, mass effect, evidence of mass lesion, ventriculomegaly, extra-axial collection or acute intracranial hemorrhage. Cervicomedullary junction and pituitary are within normal limits. Minimal scattered nonspecific cerebral white matter T2 and FLAIR hyperintensity is stable. Left lentiform involvement again noted. Deep gray nuclei otherwise appear normal. No cortical encephalomalacia or chronic cerebral blood products. Vascular: Major intracranial vascular flow voids are stable. Dominant appearing distal left vertebral artery. Skull and upper cervical spine: Negative. Visualized bone marrow signal is within normal limits. Sinuses/Orbits: Negative. Other: Visible internal auditory structures appear normal. Multiple scalp sebaceous cysts incidentally noted. IMPRESSION: 1. Bilateral lower pontine abnormal diffusion, now more intense on the left side versus two days ago. Increased T2 hyperintensity with no hemorrhage or mass effect. No new areas of involvement. Small-vessel ischemic etiology is possible. Other etiologies of symmetric brain diffusion abnormality were considered (such as toxic exposure or myelinosis), but seem unlikely given the pattern here. 2. New acute intracranial abnormality. And otherwise mild nonspecific cerebral white matter signal changes. Electronically Signed   By: Odessa Fleming M.D.   On: 05/30/2023 04:29   CT HEAD CODE STROKE WO CONTRAST`  Result Date: 05/30/2023 CLINICAL DATA:  Code stroke.  Acute neurologic deficit EXAM: CT HEAD WITHOUT CONTRAST TECHNIQUE: Contiguous axial images were obtained from the base of the skull through the vertex without intravenous contrast. RADIATION DOSE REDUCTION: This exam was performed according to the departmental dose-optimization program which includes automated  exposure control, adjustment of the mA and/or kV according to patient size and/or use of iterative reconstruction technique. COMPARISON:  05/27/2019 FINDINGS: Brain: There is no mass, hemorrhage or extra-axial collection. The size and configuration of the ventricles and extra-axial CSF spaces are normal. The brain parenchyma is normal, without evidence of acute or chronic infarction. Vascular: No abnormal hyperdensity of the major intracranial arteries or dural venous sinuses. No intracranial atherosclerosis. Skull: The visualized skull base, calvarium and extracranial soft tissues are normal. Sinuses/Orbits: No fluid levels or advanced mucosal thickening of the visualized paranasal sinuses. No mastoid or middle ear effusion. The orbits are normal. ASPECTS [facility] Stroke Program Early CT Score) - Ganglionic level infarction (caudate, lentiform nuclei, internal capsule, insula, M1-M3 cortex): 7 - Supraganglionic infarction (M4-M6 cortex): 3 Total score (0-10 with 10 being normal): 10 IMPRESSION: 1. No acute intracranial abnormality. 2. ASPECTS is 10. These results were called by telephone at the time of interpretation on 05/30/2023 at 1:03 am to provider Manuela Schwartz, who verbally acknowledged these results. Electronically Signed   By: Deatra Robinson M.D.   On: 05/30/2023 01:04   ECHOCARDIOGRAM COMPLETE BUBBLE STUDY  Result Date: 05/28/2023    ECHOCARDIOGRAM REPORT   Patient Name:   Bradley Morrison Tijerino Date of Exam: 05/28/2023 Medical Rec #:  [id number]       Height:       66.0 in Accession #:    [id number]      Weight:       211.5 lb Date of Birth:  01-08-1976        BSA:          2.048 m Patient Age:    47 years        BP:           164/96 mmHg Patient Gender: M  HR:           74 bpm. Exam Location:  [facility] Procedure: 2D Echo, Cardiac Doppler, Color Doppler and Saline Contrast Bubble            Study Indications:     Stroke 434.91 / I63.9  History:         Patient has no prior history of Echocardiogram  examinations.                  Stroke, Signs/Symptoms:Chest Pain; Risk Factors:Hypertension.  Sonographer:     Cristela Blue Referring Phys:  [id number] JAN A MANSY Diagnosing Phys: Debbe Odea MD IMPRESSIONS  1. Left ventricular ejection fraction, by estimation, is 55 to 60%. Left ventricular ejection fraction by PLAX is 55 %. The left ventricle has normal function. The left ventricle has no regional wall motion abnormalities. Left ventricular diastolic parameters are consistent with Grade I diastolic dysfunction (impaired relaxation).  2. Right ventricular systolic function is normal. The right ventricular size is normal.  3. The mitral valve is normal in structure. No evidence of mitral valve regurgitation.  4. The aortic valve was not well visualized. Aortic valve regurgitation is not visualized.  5. The inferior vena cava is normal in size with greater than 50% respiratory variability, suggesting right atrial pressure of 3 mmHg.  6. Agitated saline contrast bubble study was negative, with no evidence of any interatrial shunt. FINDINGS  Left Ventricle: Left ventricular ejection fraction, by estimation, is 55 to 60%. Left ventricular ejection fraction by PLAX is 55 %. The left ventricle has normal function. The left ventricle has no regional wall motion abnormalities. The left ventricular internal cavity size was normal in size. There is no left ventricular hypertrophy. Left ventricular diastolic parameters are consistent with Grade I diastolic dysfunction (impaired relaxation). Right Ventricle: The right ventricular size is normal. No increase in right ventricular wall thickness. Right ventricular systolic function is normal. Left Atrium: Left atrial size was normal in size. Right Atrium: Right atrial size was normal in size. Pericardium: There is no evidence of pericardial effusion. Mitral Valve: The mitral valve is normal in structure. No evidence of mitral valve regurgitation. Tricuspid Valve: The tricuspid  valve is normal in structure. Tricuspid valve regurgitation is not demonstrated. Aortic Valve: The aortic valve was not well visualized. Aortic valve regurgitation is not visualized. Aortic valve mean gradient measures 4.0 mmHg. Aortic valve peak gradient measures 7.7 mmHg. Aortic valve area, by VTI measures 2.37 cm. Pulmonic Valve: The pulmonic valve was not well visualized. Pulmonic valve regurgitation is not visualized. Aorta: The aortic root is normal in size and structure. Venous: The inferior vena cava is normal in size with greater than 50% respiratory variability, suggesting right atrial pressure of 3 mmHg. IAS/Shunts: No atrial level shunt detected by color flow Doppler. Agitated saline contrast was given intravenously to evaluate for intracardiac shunting. Agitated saline contrast bubble study was negative, with no evidence of any interatrial shunt.  LEFT VENTRICLE PLAX 2D LV EF:         Left            Diastology                ventricular     LV e' medial:    6.20 cm/s                ejection        LV E/e' medial:  12.5  fraction by     LV e' lateral:   10.20 cm/s                PLAX is 55      LV E/e' lateral: 7.6                %. LVIDd:         4.20 cm LVIDs:         3.00 cm LV PW:         0.90 cm LV IVS:        1.10 cm LVOT diam:     2.10 cm LV SV:         61 LV SV Index:   30 LVOT Area:     3.46 cm  RIGHT VENTRICLE RV Basal diam:  2.60 cm RV Mid diam:    2.50 cm RV S prime:     12.80 cm/s TAPSE (M-mode): 2.6 cm LEFT ATRIUM           Index        RIGHT ATRIUM          Index LA diam:      2.80 cm 1.37 cm/m   RA Area:     9.94 cm LA Vol (A2C): 75.7 ml 36.96 ml/m  RA Volume:   20.70 ml 10.11 ml/m LA Vol (A4C): 22.3 ml 10.89 ml/m  AORTIC VALVE AV Area (Vmax):    2.62 cm AV Area (Vmean):   2.36 cm AV Area (VTI):     2.37 cm AV Vmax:           139.00 cm/s AV Vmean:          98.000 cm/s AV VTI:            0.259 m AV Peak Grad:      7.7 mmHg AV Mean Grad:      4.0 mmHg LVOT Vmax:          105.00 cm/s LVOT Vmean:        66.900 cm/s LVOT VTI:          0.177 m LVOT/AV VTI ratio: 0.68  AORTA Ao Root diam: 3.20 cm MITRAL VALVE               TRICUSPID VALVE MV Area (PHT): 3.33 cm    TR Peak grad:   11.2 mmHg MV Decel Time: 228 msec    TR Vmax:        167.00 cm/s MV E velocity: 77.60 cm/s MV A velocity: 95.40 cm/s  SHUNTS MV E/A ratio:  0.81        Systemic VTI:  0.18 m                            Systemic Diam: 2.10 cm Debbe Odea MD Electronically signed by Debbe Odea MD Signature Date/Time: 05/28/2023/11:59:33 AM    Final    MR BRAIN WO CONTRAST  Result Date: 05/28/2023 CLINICAL DATA:  Acute neurologic deficit EXAM: MRI HEAD WITHOUT CONTRAST TECHNIQUE: Multiplanar, multiecho pulse sequences of the brain and surrounding structures were obtained without intravenous contrast. COMPARISON:  Head CT 05/27/2023 FINDINGS: Brain: Multifocal acute ischemia within the ventral pons. No acute or chronic hemorrhage. Normal white matter signal, parenchymal volume and CSF spaces. The midline structures are normal. Vascular: Major flow voids are preserved. Skull and upper cervical spine: Normal calvarium and skull base. Multiple scalp soft tissue lesions likely inclusion  cysts. Sinuses/Orbits:No paranasal sinus fluid levels or advanced mucosal thickening. No mastoid or middle ear effusion. Normal orbits. IMPRESSION: Multifocal acute ischemia within the ventral pons. No hemorrhage or mass effect. Electronically Signed   By: Deatra Robinson M.D.   On: 05/28/2023 03:19   CT ANGIO HEAD NECK W WO CM W PERF (CODE STROKE)  Addendum Date: 05/28/2023   ADDENDUM REPORT: 05/28/2023 00:58 ADDENDUM: Right vertebral artery V4 segment not visualized. The right vertebral artery probably terminates in PICA. Electronically Signed   By: Deatra Robinson M.D.   On: 05/28/2023 00:58   Result Date: 05/28/2023 CLINICAL DATA:  Right facial weakness and left leg weakness. Dysarthria. EXAM: CT ANGIOGRAPHY HEAD AND NECK CT  PERFUSION BRAIN TECHNIQUE: Multidetector CT imaging of the head and neck was performed using the standard protocol during bolus administration of intravenous contrast. Multiplanar CT image reconstructions and MIPs were obtained to evaluate the vascular anatomy. Carotid stenosis measurements (when applicable) are obtained utilizing NASCET criteria, using the distal internal carotid diameter as the denominator. Multiphase CT imaging of the brain was performed following IV bolus contrast injection. Subsequent parametric perfusion maps were calculated using RAPID software. RADIATION DOSE REDUCTION: This exam was performed according to the departmental dose-optimization program which includes automated exposure control, adjustment of the mA and/or kV according to patient size and/or use of iterative reconstruction technique. CONTRAST:  OMNIPAQUE IOHEXOL 350 MG/ML SOLN COMPARISON:  None Available. FINDINGS: CT Brain Perfusion Findings: ASPECTS: 10 CBF (<30%) Volume: 0mL Perfusion (Tmax>6.0s) volume: 0mL Mismatch Volume: 0mL Infarction Location:None CTA NECK FINDINGS SKELETON: There is no bony spinal canal stenosis. No lytic or blastic lesion. OTHER NECK: Normal pharynx, larynx and major salivary glands. No cervical lymphadenopathy. Unremarkable thyroid gland. UPPER CHEST: No pneumothorax or pleural effusion. No nodules or masses. AORTIC ARCH: There is no calcific atherosclerosis of the aortic arch. Conventional 3 vessel aortic branching pattern. RIGHT CAROTID SYSTEM: Normal without aneurysm, dissection or stenosis. LEFT CAROTID SYSTEM: Normal without aneurysm, dissection or stenosis. VERTEBRAL ARTERIES: Left dominant configuration.There is no dissection, occlusion or flow-limiting stenosis to the skull base (V1-V3 segments). CTA HEAD FINDINGS POSTERIOR CIRCULATION: --Vertebral arteries: Normal V4 segments. --Inferior cerebellar arteries: Normal. --Basilar artery: Normal. --Superior cerebellar arteries: Normal.  --Posterior cerebral arteries (PCA): Normal. ANTERIOR CIRCULATION: --Intracranial internal carotid arteries: Normal. --Anterior cerebral arteries (ACA): Normal. Both A1 segments are present. Patent anterior communicating artery (a-comm). --Middle cerebral arteries (MCA): Normal. VENOUS SINUSES: As permitted by contrast timing, patent. ANATOMIC VARIANTS: None Review of the MIP images confirms the above findings. IMPRESSION: 1. No emergent large vessel occlusion or high-grade stenosis of the intracranial arteries. 2. Normal CT perfusion. Electronically Signed: By: Deatra Robinson M.D. On: 05/28/2023 00:55   CT HEAD CODE STROKE WO CONTRAST  Result Date: 05/28/2023 CLINICAL DATA:  Code stroke.  Aphasia EXAM: CT HEAD WITHOUT CONTRAST TECHNIQUE: Contiguous axial images were obtained from the base of the skull through the vertex without intravenous contrast. RADIATION DOSE REDUCTION: This exam was performed according to the departmental dose-optimization program which includes automated exposure control, adjustment of the mA and/or kV according to patient size and/or use of iterative reconstruction technique. COMPARISON:  None Available. FINDINGS: Brain: There is no mass, hemorrhage or extra-axial collection. The size and configuration of the ventricles and extra-axial CSF spaces are normal. The brain parenchyma is normal, without evidence of acute or chronic infarction. Vascular: No abnormal hyperdensity of the major intracranial arteries or dural venous sinuses. No intracranial atherosclerosis. Skull: Normal calvarium.  Left anterior scalp  trichilemmal cyst. Sinuses/Orbits: No fluid levels or advanced mucosal thickening of the visualized paranasal sinuses. No mastoid or middle ear effusion. The orbits are normal. ASPECTS [facility] Stroke Program Early CT Score) - Ganglionic level infarction (caudate, lentiform nuclei, internal capsule, insula, M1-M3 cortex): 7 - Supraganglionic infarction (M4-M6 cortex): 3 Total score  (0-10 with 10 being normal): 10 IMPRESSION: 1. No acute intracranial abnormality. 2. ASPECTS is 10. These results were called by telephone at the time of interpretation on 05/28/2023 at 12:02 am to provider [facility] , who verbally acknowledged these results. Electronically Signed   By: Deatra Robinson M.D.   On: 05/28/2023 00:03   CT Head Wo Contrast  Result Date: 05/18/2023 CLINICAL DATA:  Headache. EXAM: CT HEAD WITHOUT CONTRAST TECHNIQUE: Contiguous axial images were obtained from the base of the skull through the vertex without intravenous contrast. RADIATION DOSE REDUCTION: This exam was performed according to the departmental dose-optimization program which includes automated exposure control, adjustment of the mA and/or kV according to patient size and/or use of iterative reconstruction technique. COMPARISON:  None Available. FINDINGS: Brain: The ventricles and sulci appropriate size for patient's age. The gray-white matter discrimination is preserved. There is no acute intracranial hemorrhage. No mass effect or midline shift. There is apparent mild separation of the cerebellar tonsil versus less likely mild hyperplasia. The fourth ventricle extends slightly posteriorly. Mild enlargement of the posterior fossa. 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 Mild Dandy-Walker is not excluded. Further evaluation with MRI on a nonemergent/outpatient basis recommended. Vascular: No hyperdense vessel or unexpected calcification. Skull: Normal. Negative for fracture or focal lesion. Sinuses/Orbits: No acute finding. Other: A 1.7 cm partially calcified ovoid lesion in the frontal scalp as well as a smaller scalp lesion over the left parietal calvarial, likely sebaceous cysts. IMPRESSION: 1. No acute intracranial pathology. 2. Possible small posterior fossa cyst versus other congenital malformation. Further evaluation with MRI on a nonemergent/outpatient basis recommended. Electronically Signed   By:  Elgie Collard M.D.   On: 05/18/2023 02:12   (Echo, Carotid, EGD, Colonoscopy, ERCP)    Subjective: Pt c/o fatigue   Discharge Exam: Vitals:   06/02/23 0402 06/02/23 1008  BP: 121/83 (!) 149/96  Pulse: 69 78  Resp: 18 18  Temp: 98.1 F (36.7 C) 98 F (36.7 C)  SpO2: 94% 96%   Vitals:   06/01/23 2000 06/02/23 0038 06/02/23 0402 06/02/23 1008  BP: 134/87 (!) 128/98 121/83 (!) 149/96  Pulse: 81 79 69 78  Resp: 18 16 18 18   Temp: 98.4 F (36.9 C) 98.3 F (36.8 C) 98.1 F (36.7 C) 98 F (36.7 C)  TempSrc:    Oral  SpO2: 97% 94% 94% 96%  Weight:      Height:        General: Pt is alert, awake, not in acute distress Cardiovascular: S1/S2 +, no rubs, no gallops Respiratory: CTA bilaterally, no wheezing, no rhonchi Abdominal: Soft, NT, obese, bowel sounds + Extremities: no edema, no cyanosis    The results of significant diagnostics from this hospitalization (including imaging, microbiology, ancillary and laboratory) are listed below for reference.     Microbiology: No results found for this or any previous visit (from the past 240 hour(s)).   Labs: BNP (last 3 results) No results for input(s): "BNP" in the last 8760 hours. Basic Metabolic Panel: Recent Labs  Lab 05/28/23 0007 05/30/23 0512 05/31/23 0521 06/01/23 0330 06/02/23 0257  NA 139 136 142 137 137  K 3.9 3.3* 3.7  3.4* 3.5  CL 106 104 110 103 103  CO2 24 22 24 25 25   GLUCOSE 138* 100* 113* 96 99  BUN 17 19 25* 20 20  CREATININE 0.68 0.70 0.77 0.73 0.72  CALCIUM 9.1 9.0 9.2 9.0 9.1   Liver Function Tests: Recent Labs  Lab 05/28/23 0007  AST 16  ALT 32  ALKPHOS 88  BILITOT 1.0  PROT 8.4*  ALBUMIN 4.2   No results for input(s): "LIPASE", "AMYLASE" in the last 168 hours. No results for input(s): "AMMONIA" in the last 168 hours. CBC: Recent Labs  Lab 05/28/23 0007 05/30/23 0512 05/31/23 0521 06/01/23 0330 06/02/23 0257  WBC 12.4* 11.4* 10.5 10.7* 10.5  NEUTROABS 7.8*  --   --   --    --   HGB 15.2 15.0 14.7 14.4 14.0  HCT 47.3 45.6 46.0 44.9 43.7  MCV 87.4 85.2 88.1 87.7 86.9  PLT 305 294 309 319 321   Cardiac Enzymes: No results for input(s): "CKTOTAL", "CKMB", "CKMBINDEX", "TROPONINI" in the last 168 hours. BNP: Invalid input(s): "POCBNP" CBG: Recent Labs  Lab 06/01/23 1206 06/01/23 1715 06/01/23 2123 06/02/23 0849 06/02/23 1100  GLUCAP 100* 99 104* 107* 126*   D-Dimer No results for input(s): "DDIMER" in the last 72 hours. Hgb A1c No results for input(s): "HGBA1C" in the last 72 hours. Lipid Profile No results for input(s): "CHOL", "HDL", "LDLCALC", "TRIG", "CHOLHDL", "LDLDIRECT" in the last 72 hours. Thyroid function studies No results for input(s): "TSH", "T4TOTAL", "T3FREE", "THYROIDAB" in the last 72 hours.  Invalid input(s): "FREET3" Anemia work up No results for input(s): "VITAMINB12", "FOLATE", "FERRITIN", "TIBC", "IRON", "RETICCTPCT" in the last 72 hours. Urinalysis    Component Value Date/Time   COLORURINE YELLOW (A) 05/28/2023 0036   APPEARANCEUR CLEAR (A) 05/28/2023 0036   APPEARANCEUR Clear 01/02/2014 0513   LABSPEC 1.015 05/28/2023 0036   LABSPEC 1.026 01/02/2014 0513   PHURINE 5.0 05/28/2023 0036   GLUCOSEU NEGATIVE 05/28/2023 0036   GLUCOSEU Negative 01/02/2014 0513   HGBUR NEGATIVE 05/28/2023 0036   BILIRUBINUR NEGATIVE 05/28/2023 0036   BILIRUBINUR Negative 01/02/2014 0513   KETONESUR NEGATIVE 05/28/2023 0036   PROTEINUR NEGATIVE 05/28/2023 0036   NITRITE NEGATIVE 05/28/2023 0036   LEUKOCYTESUR NEGATIVE 05/28/2023 0036   LEUKOCYTESUR Negative 01/02/2014 0513   Sepsis Labs Recent Labs  Lab 05/30/23 0512 05/31/23 0521 06/01/23 0330 06/02/23 0257  WBC 11.4* 10.5 10.7* 10.5   Microbiology No results found for this or any previous visit (from the past 240 hour(s)).   Time coordinating discharge: Over 30 minutes  SIGNED:   [name], MD  [facility] 06/02/2023, 11:14 AM Pager   If 7PM-7AM,  please contact night-coverage [email or web address]

## 2023-06-03 LAB — CBC WITH DIFFERENTIAL/PLATELET
Abs Immature Granulocytes: 0.03 10*3/uL (ref 0.00–0.07)
Basophils Absolute: 0.1 10*3/uL (ref 0.0–0.1)
Basophils Relative: 1 %
Eosinophils Absolute: 0.2 10*3/uL (ref 0.0–0.5)
Eosinophils Relative: 2 %
HCT: 46.8 % (ref 39.0–52.0)
Hemoglobin: 15 g/dL (ref 13.0–17.0)
Immature Granulocytes: 0 %
Lymphocytes Relative: 16 %
Lymphs Abs: 1.6 10*3/uL (ref 0.7–4.0)
MCH: 27.7 pg (ref 26.0–34.0)
MCHC: 32.1 g/dL (ref 30.0–36.0)
MCV: 86.5 fL (ref 80.0–100.0)
Monocytes Absolute: 1 10*3/uL (ref 0.1–1.0)
Monocytes Relative: 10 %
Neutro Abs: 7.4 10*3/uL (ref 1.7–7.7)
Neutrophils Relative %: 71 %
Platelets: 355 10*3/uL (ref 150–400)
RBC: 5.41 MIL/uL (ref 4.22–5.81)
RDW: 13.2 % (ref 11.5–15.5)
WBC: 10.3 10*3/uL (ref 4.0–10.5)
nRBC: 0 % (ref 0.0–0.2)

## 2023-06-03 LAB — COMPREHENSIVE METABOLIC PANEL
ALT: 44 U/L (ref 0–44)
AST: 26 U/L (ref 15–41)
Albumin: 3.5 g/dL (ref 3.5–5.0)
Alkaline Phosphatase: 92 U/L (ref 38–126)
Anion gap: 8 (ref 5–15)
BUN: 16 mg/dL (ref 6–20)
CO2: 26 mmol/L (ref 22–32)
Calcium: 9.2 mg/dL (ref 8.9–10.3)
Chloride: 104 mmol/L (ref 98–111)
Creatinine, Ser: 1.02 mg/dL (ref 0.61–1.24)
GFR, Estimated: >60 mL/min (ref >60)
Glucose, Bld: 151 mg/dL — ABNORMAL HIGH (ref 70–99)
Potassium: 3.6 mmol/L (ref 3.5–5.1)
Sodium: 138 mmol/L (ref 135–145)
Total Bilirubin: 0.7 mg/dL (ref 0.3–1.2)
Total Protein: 7.6 g/dL (ref 6.5–8.1)

## 2023-06-03 LAB — GLUCOSE, CAPILLARY
Glucose-Capillary: 96 mg/dL (ref 70–99)
Glucose-Capillary: 123 mg/dL — ABNORMAL HIGH (ref 70–99)

## 2023-06-03 LAB — VITAMIN D 25 HYDROXY (VIT D DEFICIENCY, FRACTURES): Vit D, 25-Hydroxy: 20.74 ng/mL — ABNORMAL LOW (ref 30–100)

## 2023-06-03 MED ORDER — INSULIN ASPART 100 UNIT/ML IJ SOLN: 0.0000 [IU] | Freq: Every day | INTRAMUSCULAR | Status: DC

## 2023-06-03 MED ORDER — ACETAMINOPHEN 325 MG PO TABS: 325.0000 mg | ORAL_TABLET | ORAL | Status: AC | PRN

## 2023-06-03 MED ORDER — LIDOCAINE HCL URETHRAL/MUCOSAL 2 % EX GEL: CUTANEOUS | Status: DC | PRN

## 2023-06-03 MED ORDER — INSULIN ASPART 100 UNIT/ML IJ SOLN: 0.0000 [IU] | Freq: Three times a day (TID) | INTRAMUSCULAR | Status: DC

## 2023-06-03 NOTE — Progress Notes (Signed)
Patient ID: Bradley Morrison, male   DOB: 1976-01-15, 47 y.o.   MRN: [id number]  SW left VM for patient mother, Elnita Maxwell. Sw will wait for FU,

## 2023-06-03 NOTE — Plan of Care (Signed)
  Problem: RH Balance Goal: LTG Patient will maintain dynamic standing with ADLs (OT) Description: LTG:  Patient will maintain dynamic standing balance with assist during activities of daily living (OT)  Flowsheets (Taken 06/03/2023 1144) LTG: Pt will maintain dynamic standing balance during ADLs with: Independent with assistive device   Problem: Sit to Stand Goal: LTG:  Patient will perform sit to stand in prep for activites of daily living with assistance level (OT) Description: LTG:  Patient will perform sit to stand in prep for activites of daily living with assistance level (OT) Flowsheets (Taken 06/03/2023 1144) LTG: PT will perform sit to stand in prep for activites of daily living with assistance level: Independent with assistive device   Problem: RH Bathing Goal: LTG Patient will bathe all body parts with assist levels (OT) Description: LTG: Patient will bathe all body parts with assist levels (OT) Flowsheets (Taken 06/03/2023 1144) LTG: Pt will perform bathing with assistance level/cueing: Independent with assistive device    Problem: RH Dressing Goal: LTG Patient will perform upper body dressing (OT) Description: LTG Patient will perform upper body dressing with assist, with/without cues (OT). Flowsheets (Taken 06/03/2023 1144) LTG: Pt will perform upper body dressing with assistance level of: Independent with assistive device Goal: LTG Patient will perform lower body dressing w/assist (OT) Description: LTG: Patient will perform lower body dressing with assist, with/without cues in positioning using equipment (OT) Flowsheets (Taken 06/03/2023 1144) LTG: Pt will perform lower body dressing with assistance level of: Independent with assistive device   Problem: RH Toileting Goal: LTG Patient will perform toileting task (3/3 steps) with assistance level (OT) Description: LTG: Patient will perform toileting task (3/3 steps) with assistance level (OT)  Flowsheets (Taken 06/03/2023  1144) LTG: Pt will perform toileting task (3/3 steps) with assistance level: Independent with assistive device   Problem: RH Toilet Transfers Goal: LTG Patient will perform toilet transfers w/assist (OT) Description: LTG: Patient will perform toilet transfers with assist, with/without cues using equipment (OT) Flowsheets (Taken 06/03/2023 1144) LTG: Pt will perform toilet transfers with assistance level of: Independent with assistive device   Problem: RH Tub/Shower Transfers Goal: LTG Patient will perform tub/shower transfers w/assist (OT) Description: LTG: Patient will perform tub/shower transfers with assist, with/without cues using equipment (OT) Flowsheets (Taken 06/03/2023 1144) LTG: Pt will perform tub/shower stall transfers with assistance level of: Set up assist

## 2023-06-03 NOTE — Progress Notes (Signed)
Patient ID: TUCK TIN, male   DOB: 06/26/1976, 47 y.o.   MRN: [id number] Met with the patient to review current situation, rehab process, team conference and plan of care. Discussed secondary risks including HTN, HLD, prediabetes (A1C 6.5) and smoking.  Reported aware of risks but not taking medication due to communication glitch with his PCP.  Reviewed current medications, and dietary modification recommendations.  Reports cessation of smoking is not an issue; no need for nicotine patch at present.  Discussed urinary urgency/frequency; forwarded concern to MD/PAC. Continue to follow along to address educational needs to facilitate preparation for discharge. Pamelia Hoit

## 2023-06-03 NOTE — Discharge Instructions (Addendum)
Inpatient Rehab Discharge Instructions  Bradley Morrison Discharge date and time:  06/11/23  Activities/Precautions/ Functional Status: Activity: no lifting, driving, or strenuous exercise till cleared by MD Diet: diabetic diet  Low fat/low cholesterol. Needs Chin tuck with all liquids.  Wound Care: none needed   Functional status:  ___ No restrictions     ___ Walk up steps independently ___ 24/7 supervision/assistance   ___ Walk up steps with assistance _X__ Intermittent supervision/assistance  ___ Bathe/dress independently ___ Walk with walker     ___ Bathe/dress with assistance ___ Walk Independently    ___ Shower independently ___ Walk with assistance    _X__ Shower with assistance _X__ No alcohol     ___ Return to work/school ________   Special Instructions:   COMMUNITY REFERRALS UPON DISCHARGE:    Home Health:   PT     OT     ST                Agency:[facility]   Phone:[phone] *Please expect follow-up within 2-3 days to schedule your home visit. If you have not received follow-up, be sure    Medical Equipment/Items Ordered: already has DME needed.                                                       STROKE/TIA DISCHARGE INSTRUCTIONS SMOKING Cigarette smoking nearly doubles your risk of having a stroke & is the single most alterable risk factor  If you smoke or have smoked in the last 12 months, you are advised to quit smoking for your health. Most of the excess cardiovascular risk related to smoking disappears within a year of stopping. Ask you doctor about anti-smoking medications [facility] Quit Line: 1-800-QUIT NOW Free Smoking Cessation Classes [phone]  CHOLESTEROL Know your levels; limit fat & cholesterol in your diet  Lipid Panel     Component Value Date/Time   CHOL 109 05/28/2023 1012   TRIG 77 05/28/2023 1012   HDL 30 (L) 05/28/2023 1012   CHOLHDL 3.6 05/28/2023 1012   VLDL 15 05/28/2023 1012   LDLCALC 64 05/28/2023 1012     Many  patients benefit from treatment even if their cholesterol is at goal. Goal: Total Cholesterol (CHOL) less than 160 Goal:  Triglycerides (TRIG) less than 150 Goal:  HDL greater than 40 Goal:  LDL (LDLCALC) less than 100   BLOOD PRESSURE American Stroke Association blood pressure target is less that 120/80 mm/Hg  Your discharge blood pressure is:  BP: 125/76 Monitor your blood pressure Limit your salt and alcohol intake Many individuals will require more than one medication for high blood pressure  DIABETES (A1c is a blood sugar average for last 3 months) Goal HGBA1c is under 7% (HBGA1c is blood sugar average for last 3 months)  Diabetes:     Lab Results  Component Value Date   HGBA1C 6.5 (H) 05/28/2023    Your HGBA1c can be lowered with medications, healthy diet, and exercise. Check your blood sugar as directed by your physician Call your physician if you experience unexplained or low blood sugars.  PHYSICAL ACTIVITY/REHABILITATION Goal is 30 minutes at least 4 days per week  Activity: No driving, Therapies: see above Return to work:  N/A Activity decreases your risk of heart attack and stroke and makes your heart stronger.  It helps control your weight and blood pressure; helps you relax and can improve your mood. Participate in a regular exercise program. Talk with your doctor about the best form of exercise for you (dancing, walking, swimming, cycling).  DIET/WEIGHT Goal is to maintain a healthy weight  Your discharge diet is:  Diet Order             DIET DYS 3 Room service appropriate? Yes; Fluid consistency: Thin  Diet effective now                   liquids Your height is:  Height: 5\' 6"  (167.6 cm) Your current weight is: Weight: 90.9 kg Your Body Mass Index (BMI) is:  BMI (Calculated): 32.34 Following the type of diet specifically designed for you will help prevent another stroke. Your goal weight  is:   Your goal Body Mass Index (BMI) is 19-24. Healthy food habits  can help reduce 3 risk factors for stroke:  High cholesterol, hypertension, and excess weight.  RESOURCES Stroke/Support Group:  Call [phone]   STROKE EDUCATION PROVIDED/REVIEWED AND GIVEN TO PATIENT Stroke warning signs and symptoms How to activate emergency medical system (call 911). Medications prescribed at discharge. Need for follow-up after discharge. Personal risk factors for stroke. Pneumonia vaccine given:  Flu vaccine given:  My questions have been answered, the writing is legible, and I understand these instructions.  I will adhere to these goals & educational materials that have been provided to me after my discharge from the hospital.     My questions have been answered and I understand these instructions. I will adhere to these goals and the provided educational materials after my discharge from the hospital.  Patient/Caregiver Signature _______________________________ Date __________  Clinician Signature _______________________________________ Date __________  Please bring this form and your medication list with you to all your follow-up doctor's appointments.

## 2023-06-03 NOTE — Evaluation (Signed)
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 No pertinent surgical history.  Assessment & Plan Clinical Impression: Patient is a 47 y.o. year old male history of CP, HTN, recent R-CVA with residual left sided weakness and dysarthria, T2DM, anxiety who was admitted to [facility] 05/28/23 with right facial droop and worsening of dysarthria. CTA head was negative for LVO or high grade stenosis. UDS positive for cannabinoids.  MRI brain showed multifocal acute ischemia ventral pons and Dr. Wilford Corner felt that patient with acute and subacute pontine infarct 2 weeks PTA with expansion now involving right pons as well as small vessel etiology. He recommended DAPT X 3 weeks followed by ASA alone. Hypercoagulable panel ordered.    He had worsening of symptoms on 08/11 and MRI brain showing abnormal diffusion more intense on left with increased T2 intensity and no new areas of involvement or new abnormality.  Dr. Wilford Corner felt that it was not uncommon for small vessel stroke to expand and get worse before they start to get better and to continue current management. To avoid hypotension and counsel of smoking cessation. MBS performed 08/12 revealing mild oral dysphagia with inconsistent aspiration of thins and chin tuck effective in eliminating aspiration.  On D2, thins with chin  tuck and intermittent supervision. Therapy has been ongoing and patient is limited by left sided weakness with balance deficits, requires multiple rest breaks. He requires min to mod assist with ADLs and mobility.  Patient transferred to CIR on 06/02/2023 .    Patient currently requires min with basic self-care skills secondary to muscle weakness, decreased cardiorespiratoy endurance, impaired timing and sequencing, unbalanced muscle activation, and decreased coordination, and decreased sitting balance, decreased standing balance, decreased postural control, and decreased balance strategies.  Prior to hospitalization, patient could complete ADL with modified independent .  Patient will benefit from skilled intervention to decrease level of assist with basic self-care skills and increase independence with basic self-care skills prior to discharge home with care partner.  Anticipate patient will require intermittent supervision and follow up home health.  OT - End of Session Activity Tolerance: Tolerates < 10 min activity, no significant change in vital signs Endurance Deficit: Yes OT Assessment Rehab Potential (ACUTE ONLY): Good OT Patient demonstrates impairments in the following area(s): Balance;Endurance;Motor;Pain;Safety OT Basic ADL's Functional Problem(s): Bathing;Dressing;Toileting OT Transfers Functional Problem(s): Toilet;Tub/Shower OT Additional Impairment(s): None OT Plan OT Intensity: Minimum of 1-2 x/day, 45 to 90 minutes OT Frequency: 5 out of 7 days OT Duration/Estimated Length of Stay: ~ 7-9 days OT Treatment/Interventions: Balance/vestibular training;Neuromuscular re-education;Self Care/advanced ADL retraining;Therapeutic Exercise;DME/adaptive equipment instruction;Pain management;Skin care/wound managment;UE/LE Strength taining/ROM;Community reintegration;Patient/family education;Splinting/orthotics;UE/LE Coordination activities;Discharge planning;Functional mobility  training;Psychosocial support;Therapeutic Activities OT Self Feeding Anticipated Outcome(s): n/a OT Basic Self-Care Anticipated Outcome(s): mod I OT Toileting Anticipated Outcome(s): mod I OT Bathroom Transfers Anticipated Outcome(s): mod I OT Recommendation Patient destination: Home Follow Up Recommendations: Home health  OT Equipment Recommended: None recommended by OT   OT Evaluation Precautions/Restrictions  Precautions Precautions: Fall Precaution Comments: old residual left hemiparesis Restrictions Weight Bearing Restrictions: No General Chart Reviewed: Yes Family/Caregiver Present: No Pain Pain Assessment Pain Scale: 0-10 Pain Score: 0-No pain Home Living/Prior Functioning Home Living Family/patient expects to be discharged to:: Private residence Living Arrangements: Parent Available Help at Discharge: Family, Available 24 hours/day Type of Home: Other(Comment) Home Access: Stairs to enter [employer] of Steps: 5 at parents home Entrance Stairs-Rails: Left Home Layout: One level Bathroom Shower/Tub: Engineer, manufacturing systems: Standard Additional Comments: has a tub bench= plans to d/c back to parents' house  Lives With: Family Vision Baseline Vision/History: 1 Wears glasses Ability to See in Adequate Light: 0 Adequate Patient Visual Report: No change from baseline Vision Assessment?: Yes Eye Alignment: Within Functional Limits Ocular Range of Motion: Within Functional Limits Alignment/Gaze Preference: Within Defined Limits Tracking/Visual Pursuits: Able to track stimulus in all quads without difficulty Perception  Perception: Within Functional Limits Praxis Praxis: [facility] Cognition Cognition Arousal/Alertness: Awake/alert Orientation Level: Person;Place;Situation Person: Oriented Place: Oriented Situation: Oriented Memory: Appears intact Attention: Sustained Sustained Attention: Appears intact Awareness: Appears  intact Safety/Judgment: Impaired Brief Interview for Mental Status (BIMS) Repetition of Three Words (First Attempt): 3 Temporal Orientation: Year: Correct Temporal Orientation: Month: Accurate within 5 days Temporal Orientation: Day: Correct Recall: "Sock": Yes, after cueing ("something to wear") Recall: "Blue": Yes, no cue required Recall: "Bed": Yes, no cue required BIMS Summary Score: 14 Sensation Sensation Light Touch: Appears Intact Hot/Cold: Appears Intact Proprioception: Appears Intact Coordination Gross Motor Movements are Fluid and Coordinated: No Fine Motor Movements are Fluid and Coordinated: Yes Finger Nose Finger Test: slightly off on the right Motor  Motor Motor: Hemiplegia Motor - Skilled Clinical Observations: old hemiplegia with increased weakness on left side  Trunk/Postural Assessment  Cervical Assessment Cervical Assessment: Within Functional Limits Thoracic Assessment Thoracic Assessment: Within Functional Limits Lumbar Assessment Lumbar Assessment: Within Functional Limits Postural Control Postural Control: Deficits on evaluation Righting Reactions: delayed Protective Responses: delayed  Balance Balance Balance Assessed: Yes Dynamic Sitting Balance Dynamic Sitting - Balance Support: During functional activity Dynamic Sitting - Level of Assistance: 5: Stand by assistance Static Standing Balance Static Standing - Balance Support: During functional activity Static Standing - Level of Assistance: 5: Stand by assistance Dynamic Standing Balance Dynamic Standing - Balance Support: During functional activity Dynamic Standing - Level of Assistance: 4: Min assist Extremity/Trunk Assessment RUE Assessment RUE Assessment: Within Functional Limits LUE Assessment LUE Assessment: Within Functional Limits  Care Tool Care Tool Self Care Eating   Eating Assist Level: Set up assist    Oral Care    Oral Care Assist Level: Set up assist    Bathing    Body parts bathed by patient: Right arm;Left arm;Chest;Abdomen;Front perineal area;Buttocks;Right upper leg;Left upper leg;Right lower leg;Left lower leg;Face     Assist Level: Contact Guard/Touching assist    Upper Body Dressing(including orthotics)   What is the patient wearing?: Pull over shirt   Assist Level: Set up assist    Lower Body Dressing (excluding footwear)   What is the patient wearing?: Pants;Incontinence brief Assist for lower body dressing: Minimal Assistance - Patient > 75%    Putting on/Taking off footwear   What is the patient wearing?: Socks;Shoes Assist for footwear: Supervision/Verbal cueing       Care Tool Toileting Toileting activity   Assist for toileting: Contact Guard/Touching assist     Care Tool Bed Mobility Roll  left and right activity        Sit to lying activity        Lying to sitting on side of bed activity   Lying to sitting on side of bed assist level: the ability to move from lying on the back to sitting on the side of the bed with no back support.: Minimal Assistance - Patient > 75%     Care Tool Transfers Sit to stand transfer   Sit to stand assist level: Minimal Assistance - Patient > 75%    Chair/bed transfer   Chair/bed transfer assist level: Minimal Assistance - Patient > 75%     Toilet transfer   Assist Level: Minimal Assistance - Patient > 75%     Care Tool Cognition  Expression of Ideas and Wants Expression of Ideas and Wants: 4. Without difficulty (complex and basic) - expresses complex messages without difficulty and with speech that is clear and easy to understand  Understanding Verbal and Non-Verbal Content Understanding Verbal and Non-Verbal Content: 4. Understands (complex and basic) - clear comprehension without cues or repetitions   Memory/Recall Ability     Refer to Care Plan for Long Term Goals  SHORT TERM GOAL WEEK 1 OT Short Term Goal 1 (Week 1): LTG=STG  Recommendations for other services: None     Skilled Therapeutic Intervention Ot eval initiated with purpose, role and goals discussed with pt. Pt reports he feels like his left side is weaker than it was before. He was using a RW prior to admit. Pt ambulated to the bathroom with RW with contact guard/min A with cues for safety. Pt ambulates at slow speed. Pt able to shower sit to stand with grab bar. Pt able to dress all parts with steadying A for standing balance. Pt able to stand at the sink to brush teeth with supervision. Pt does have urgency with urination - having to go twice in session - reported to nursing. Pt voided second time in standing with RW over the toilet with steadying A. Pt ambulated from room around the RN station (through the dayroom) ~ 120 feet with RW  with contact guard. Noted that pt does walk on the outside of hisleft foot and does have decr knee control / hyperextending his left knee (new now) in ambulation. Pt left resting in the w/c with chair alarm pad.   ADL ADL Eating: Set up Grooming: Setup Upper Body Bathing: Contact guard Where Assessed-Upper Body Bathing: Shower Lower Body Bathing: Contact guard Where Assessed-Lower Body Bathing: Shower Upper Body Dressing: Setup Where Assessed-Upper Body Dressing: Wheelchair Lower Body Dressing: Contact guard Where Assessed-Lower Body Dressing: Wheelchair Toileting: Minimal assistance Where Assessed-Toileting: Teacher, adult education: Curator Method: Event organiser: Insurance underwriter Method: Designer, industrial/product: Shower seat with back Mobility  Bed Mobility Bed Mobility: Supine to Sit Supine to Sit: Minimal Assistance - Patient > 75% Transfers Sit to Stand: Contact Guard/Touching assist   Discharge Criteria: Patient will be discharged from OT if patient refuses treatment 3 consecutive times without medical reason, if treatment goals not met, if there is a change in  medical status, if patient makes no progress towards goals or if patient is discharged from hospital.  The above assessment, treatment plan, treatment alternatives and goals were discussed and mutually agreed upon: by patient  Adan Sis 06/03/2023, 11:29 AM

## 2023-06-03 NOTE — Evaluation (Signed)
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 No pertinent surgical history.  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  MRI brain showed multifocal acute ischemia ventral pons and Dr. Wilford Corner felt that patient with acute and subacute pontine infarct 2 weeks PTA with expansion now involving right pons as well as small vessel etiology.  He had worsening of symptoms on 08/11 and MRI brain showing abnormal diffusion more intense on left with increased T2 intensity and no new areas of involvement or new abnormality.  Dr. Wilford Corner felt that it was not uncommon for small vessel stroke to expand and get worse before they start to get better and to continue current management. MBS performed 08/12 revealing mild oral dysphagia with inconsistent aspiration of thins and chin tuck effective in eliminating aspiration.  On D2, thins with chin tuck and intermittent supervision. Therapy has been ongoing and patient is limited by left sided weakness with balance deficits, requires multiple rest breaks. He requires min to mod assist with ADLs and mobility.    Clinical Impression:   Bedside Swallow Evaluation:  Patient with overt s/sx of moderate oropharyngeal dysphagia. Oral mechanism exam revealed R sided facial/oral weakness and reduced sensation. Patient consumed thin liquids via cup/straw, puree, and dys 2 textures with x1 occurrence of immediate cough though unsure if due to bolus misdirection and/or chronic cough. Patient with prolonged mastication, though adequate oral clearance with dys 2 textures. Reports he benefits from double swallows during solids.  MBS completed on 8/12 revealed aspiration of thin liquids however improvement via use of chin tuck. Recommend dys 2 textures/thin liquids with chin tuck due to oropharyngeal deficits. Intermittent supervision of meals. Medications crushed in puree.    Cognition: Patient was administered the Cognistat standardized assessment to evaluate cognitive-linguistic functioning. Patient scored WFL on orientation, comprehension, repetition, naming, visual construction, memory, calculations and reasoning/executive functioning. Patient with mild deficits in attention and registration likely due to short term memory/retrieval deficits. Though patient scored WFL on problem solving and memory subtest's, patient reports changes in memory and high level cognitive functioning.   Dysarthria: Patient presents with mild dysarthria characterized by hoarse vocal quality, hypernasality, and imprecise articulation likely due to R sided weakness. Patient was ~80% intelligible at the conversational level with increased intelligibility during utilization of slow rate and increased vocal intensity.   Pt would benefit from skilled SLP intervention to maximize swallowing safety, speech intelligibility, and cognition in order to maximize his functional independence at d/c. Anticipate patient will require supervision at d/c and OP SLP f/u.    Skilled Therapeutic Interventions          Patient evaluated using a standardized cognitive linguistic assessment and bedside swallow evaluation to  assess current cognitive, communicative and  swallowing function. See above for details.   SLP Assessment  Patient will need skilled Speech Lanaguage Pathology Services during CIR admission    Recommendations  SLP Diet Recommendations: Dysphagia 2 (Fine chop);Thin Liquid Administration via: Cup;Straw Medication Administration: Crushed with puree Supervision: Patient able to self feed;Intermittent supervision to cue for compensatory strategies Compensations: Minimize environmental distractions;Slow rate;Small sips/bites;Chin tuck Postural Changes and/or Swallow Maneuvers: Out of bed for meals;Seated upright 90 degrees Oral Care Recommendations: Oral care BID;Patient independent with oral care Recommendations for Other Services: Neuropsych consult Patient destination: Home Follow up Recommendations: Outpatient SLP Equipment Recommended: None recommended by SLP    SLP Frequency 1 to 3 out of 7 days   SLP Duration  SLP Intensity  SLP Treatment/Interventions 12-14  Minumum of 1-2 x/day, 30 to 90 minutes  Cognitive remediation/compensation;Dysphagia/aspiration precaution training;Internal/external aids;Speech/Language facilitation;Cueing hierarchy;Therapeutic Activities;Functional tasks;Patient/family education    Pain No pain reported during evaluation  Prior Functioning Type of Home: Other(Comment)  Lives With: Family Available Help at Discharge: Family;Available 24 hours/day  SLP Evaluation Cognition Overall Cognitive Status: Impaired/Different from baseline Arousal/Alertness: Awake/alert Orientation Level: Oriented X4 Year: 2024 Month: August Day of Week: Incorrect Attention: Sustained Sustained Attention: Appears intact Memory: Impaired Memory Impairment: Decreased short term memory Decreased Short Term Memory: Verbal complex;Functional complex Awareness: Appears intact Problem Solving: Impaired Problem Solving Impairment: Verbal complex;Functional  complex Safety/Judgment: Appears intact  Comprehension Auditory Comprehension Overall Auditory Comprehension: Appears within functional limits for tasks assessed Expression Expression Primary Mode of Expression: Verbal Verbal Expression Overall Verbal Expression: Appears within functional limits for tasks assessed Oral Motor Oral Motor/Sensory Function Overall Oral Motor/Sensory Function: Moderate impairment Facial ROM: Reduced right;Suspected CN VII (facial) dysfunction Facial Symmetry: Abnormal symmetry right;Suspected CN VII (facial) dysfunction Facial Strength: Reduced right;Suspected CN VII (facial) dysfunction Facial Sensation: Reduced right;Suspected CN V (Trigeminal) dysfunction Lingual ROM: Reduced right Lingual Symmetry: Within Functional Limits Lingual Strength: Within Functional Limits Mandible: Within Functional Limits Motor Speech Overall Motor Speech: Impaired Respiration: Within functional limits Phonation: Low vocal intensity;Hoarse Resonance: Hypernasality Articulation: Impaired Intelligibility: Intelligibility reduced Word: 75-100% accurate Phrase: 75-100% accurate Sentence: 75-100% accurate Conversation: 75-100% accurate Effective Techniques: Slow rate;Increased vocal intensity  Care Tool Care Tool Cognition Ability to hear (with hearing aid or hearing appliances if normally used Ability to hear (with hearing aid or hearing appliances if normally used): 0. Adequate - no difficulty in normal conservation, social interaction, listening to TV   Expression of Ideas and Wants Expression of Ideas and Wants: 4. Without difficulty (complex and basic) - expresses complex messages without difficulty and with speech that is clear and easy to understand   Understanding Verbal and Non-Verbal Content Understanding Verbal and Non-Verbal Content: 4. Understands (complex and basic) - clear comprehension without cues or repetitions  Memory/Recall Ability     Bedside  Swallowing Assessment General Previous Swallow Assessment: 8/9 Diet Prior to this Study: Dysphagia 2 (finely chopped);Thin liquids (Level 0) Respiratory Status: Room air History of Recent Intubation: No Behavior/Cognition: Alert;Cooperative;Pleasant mood Oral Cavity - Dentition: Adequate natural dentition Self-Feeding Abilities: Able to feed self;Needs assist Vision: Functional for self-feeding Patient Positioning: Upright in chair/Tumbleform Baseline Vocal Quality: Hoarse Volitional Cough: Strong;Congested Volitional Swallow: Able to elicit  Ice Chips Ice chips: Not tested Thin Liquid Thin Liquid: Impaired Presentation: Cup;Self Fed;Straw Oral Phase Impairments: Reduced lingual movement/coordination Pharyngeal  Phase Impairments: Suspected delayed Swallow;Cough - Immediate;Cough - Delayed Nectar Thick Nectar Thick Liquid: Not tested Honey Thick Honey Thick Liquid: Not tested Puree Puree: Within functional limits Presentation: Self Fed;Spoon  Other Comments: WFL bolus management and swallow Solid Solid: Impaired Presentation: Self Fed;Spoon Oral Phase Impairments: Impaired mastication Oral Phase Functional Implications: Impaired mastication Pharyngeal Phase Impairments: Multiple swallows BSE Assessment Risk for Aspiration Impact on safety and function: Moderate aspiration risk Other Related Risk Factors: History of dysphagia;Previous CVA;Deconditioning  Short Term Goals: Week 1: SLP Short Term Goal 1 (Week 1): Patient will consume least restrictive diet utilizing compensatory with no overt s/sx of aspiration with min verbal A SLP Short Term Goal 2 (Week 1): Patient will increase speech intelligibility to 90% at the conversational level utilizing compensatory strategies with min verbal A SLP Short Term Goal 3 (Week 1): Patient will demonstrate problem solving skills during mildly complex functional tasks with min multimodal A SLP Short Term Goal 4 (Week 1): Patient will  recall and utilize memory compensatory strategies with mulitmodal minA  Refer to Care Plan for Long Term Goals  Recommendations for other services: Neuropsych  Discharge Criteria: Patient will be discharged from SLP if patient refuses treatment 3 consecutive times without medical reason, if treatment goals not met, if there is a change in medical status, if patient makes no progress towards goals or if patient is discharged from hospital.  The above assessment, treatment plan, treatment alternatives and goals were discussed and mutually agreed upon: by patient  Bradley Morrison M.A., CF-SLP 06/03/2023, 12:52 PM

## 2023-06-03 NOTE — Plan of Care (Signed)
  Problem: RH Swallowing Goal: LTG Patient will consume least restrictive diet using compensatory strategies with assistance (SLP) Description: LTG:  Patient will consume least restrictive diet using compensatory strategies with assistance (SLP) 06/03/2023 1252 by Elby Showers, Caretha Rumbaugh F, CCC-SLP Flowsheets (Taken 06/03/2023 1252) LTG: Pt Patient will consume least restrictive diet using compensatory strategies with assistance of (SLP): Supervision 06/03/2023 1248 by Lynne Takemoto F, CCC-SLP Flowsheets (Taken 06/03/2023 1248) LTG: Pt Patient will consume least restrictive diet using compensatory strategies with assistance of (SLP): Modified Independent   Problem: RH Expression Communication Goal: LTG Patient will increase speech intelligibility (SLP) Description: LTG: Patient will increase speech intelligibility at word/phrase/conversation level with cues, % of the time (SLP) 06/03/2023 1252 by Elby Showers, Ericka Marcellus F, CCC-SLP Flowsheets (Taken 06/03/2023 1252) LTG: Patient will increase speech intelligibility (SLP): Supervision 06/03/2023 1248 by Aby Gessel F, CCC-SLP Flowsheets (Taken 06/03/2023 1248) LTG: Patient will increase speech intelligibility (SLP): Modified Independent Level: Conversation level Percent of time patient will use intelligible speech: 90   Problem: RH Problem Solving Goal: LTG Patient will demonstrate problem solving for (SLP) Description: LTG:  Patient will demonstrate problem solving for basic/complex daily situations with cues  (SLP) 06/03/2023 1252 by Elby Showers, Bowen Goyal F, CCC-SLP Flowsheets (Taken 06/03/2023 1252) LTG Patient will demonstrate problem solving for: Supervision 06/03/2023 1248 by Vivia Rosenburg F, CCC-SLP Flowsheets (Taken 06/03/2023 1248) LTG: Patient will demonstrate problem solving for (SLP): (mildy complex) -- LTG Patient will demonstrate problem solving for: Modified Independent   Problem: RH Memory Goal: LTG Patient will use memory  compensatory aids to (SLP) Description: LTG:  Patient will use memory compensatory aids to recall biographical/new, daily complex information with cues (SLP) 06/03/2023 1252 by Elby Showers, Tamica Covell F, CCC-SLP Flowsheets (Taken 06/03/2023 1252) LTG: Patient will use memory compensatory aids to (SLP): Supervision 06/03/2023 1248 by Winn Muehl F, CCC-SLP Flowsheets (Taken 06/03/2023 1248) LTG: Patient will use memory compensatory aids to (SLP): Modified Independent

## 2023-06-03 NOTE — Progress Notes (Signed)
[facility]patient Rehabilitation [facility] Individual Statement of Services  Patient Name:  Bradley Morrison  Date:  06/03/2023  Welcome to the Inpatient Rehabilitation Center.  Our goal is to provide you with an individualized program based on your diagnosis and situation, designed to meet your specific needs.  With this comprehensive rehabilitation program, you will be expected to participate in at least 3 hours of rehabilitation therapies Monday-Friday, with modified therapy programming on the weekends.  Your rehabilitation program will include the following services:  Physical Therapy (PT), Occupational Therapy (OT), Speech Therapy (ST), 24 hour per day rehabilitation nursing, Therapeutic Recreaction (TR), Neuropsychology, Care Coordinator, Rehabilitation Medicine, Nutrition Services, Pharmacy Services, and Other  Weekly team conferences will be held on Wednesdays to discuss your progress.  Your Inpatient Rehabilitation Care Coordinator will talk with you frequently to get your input and to update you on team discussions.  Team conferences with you and your family in attendance may also be held.  Expected length of stay: 12-14 Days  Overall anticipated outcome:  Supervision  Depending on your progress and recovery, your program may change. Your Inpatient Rehabilitation Care Coordinator will coordinate services and will keep you informed of any changes. Your Inpatient Rehabilitation Care Coordinator's name and contact numbers are listed  below.  The following services may also be recommended but are not provided by the Inpatient Rehabilitation Center:   Home Health Rehabiltiation Services Outpatient Rehabilitation Services    Arrangements will be made to provide these services after discharge if needed.  Arrangements include referral to agencies that provide these services.  Your insurance has been verified to be:  [employer] Your primary doctor is:  Bear Stearns, [employer]  Pertinent  information will be shared with your doctor and your insurance company.  Inpatient Rehabilitation Care Coordinator:  Lavera Guise, [place] [phone] or [phone]  Information discussed with and copy given to patient by: Andria Rhein, 06/03/2023, 8:32 AM

## 2023-06-03 NOTE — Evaluation (Signed)
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 No pertinent surgical history.  Assessment & Plan Clinical Impression: HAWKINS DUYCK is a 47 year old male with history of CP, HTN, recent R-CVA with residual left sided weakness and dysarthria, T2DM, anxiety who was admitted to [facility] 05/28/23 with right facial droop and worsening of dysarthria. CTA head was negative for LVO or high grade stenosis. UDS positive for cannabinoids.  MRI brain showed multifocal acute ischemia ventral pons and Dr. Wilford Corner felt that patient with acute and subacute pontine infarct 2 weeks PTA with expansion now involving right pons as well as small vessel etiology. He recommended DAPT X 3 weeks followed by ASA alone. Hypercoagulable panel ordered.    He had worsening of symptoms on 08/11 and MRI brain showing abnormal diffusion more intense on left with increased T2 intensity and no new areas of involvement or new abnormality.  Dr. Wilford Corner felt that it was not uncommon for small vessel stroke to expand and get worse before they start to get better and to continue current management. To avoid hypotension and counsel of smoking cessation. MBS performed 08/12 revealing mild oral dysphagia with inconsistent aspiration of thins and chin tuck effective in eliminating aspiration.  On D2, thins with chin tuck and intermittent supervision. Therapy has been  ongoing and patient is limited by left sided weakness with balance deficits, requires multiple rest breaks. He requires min to mod assist with ADLs and mobility. CIR recommended due to functional decline. Father asks what are potential causes of stroke.  Patient currently requires min with mobility secondary to decreased coordination and decreased motor planning.  Prior to hospitalization, patient was supervision with mobility and lived with Family (prior to recent medical condition, lived w/ friend.  will be D/Cing to mother's home.) in a Other(Comment) home.  Home access is 5 at parents homeStairs to enter.  Patient will benefit from skilled PT intervention to maximize safe functional mobility, minimize fall risk, and decrease caregiver burden for planned discharge home with 24 hour supervision.  Anticipate patient will benefit from follow up OP at discharge.  PT - End of Session Activity Tolerance: Tolerates 30+ min activity with multiple rests Endurance Deficit: Yes PT Assessment Rehab Potential (ACUTE/IP ONLY): Good PT Barriers to Discharge: Inaccessible home environment;Home environment access/layout PT Patient demonstrates impairments in the following area(s): Balance;Safety;Endurance;Motor PT Transfers Functional Problem(s): Bed Mobility;Bed to Chair;Car;Furniture;Floor PT Locomotion Functional Problem(s): Ambulation;Wheelchair Mobility;Stairs PT Plan PT Intensity: Minimum of 1-2 x/day ,45 to 90 minutes PT Frequency: 5 out of 7 days PT Duration Estimated Length of Stay: 7-9 days PT Treatment/Interventions: Ambulation/gait training;Community reintegration;Neuromuscular re-education;Stair training;UE/LE Strength taining/ROM;Wheelchair propulsion/positioning;Balance/vestibular training;Discharge planning;Therapeutic Activities;UE/LE Coordination activities;Therapeutic Exercise;Patient/family education;Functional mobility training PT Transfers Anticipated Outcome(s): Mod I PT Locomotion  Anticipated Outcome(s): Mod I w/ LRAD PT Recommendation Follow Up Recommendations: Outpatient PT Patient destination: Home Equipment Recommended: To be determined   PT Evaluation Precautions/Restrictions Precautions Precautions: Fall Precaution  Comments: old residual left hemiparesis Restrictions Weight Bearing Restrictions: No General Chart Reviewed: Yes Family/Caregiver Present: No Vital Signs  Pain Pain Assessment Pain Scale: 0-10 Pain Score: 0-No pain (some soreness L thigh.) Pain Interference Pain Interference Pain Effect on Sleep: 0. Does not apply - I have not had any pain or hurting in the past 5 days Pain Interference with Therapy Activities: 0. Does not apply - I have not received rehabilitationtherapy in the past 5 days Pain Interference with Day-to-Day Activities: 1. Rarely or not at all Home Living/Prior Functioning Home Living Home Access: Stairs to enter Entrance Stairs-Number of Steps: 5 at parents home Entrance Stairs-Rails: Left Home Layout: One level Additional Comments: has a tub bench= plans to d/c back to parents' house  Lives With: Family (prior to recent medical condition, lived w/ friend.  will be D/Cing to mother's home.) Prior Function Level of Independence: Independent with transfers;Independent with gait  Able to Take Stairs?: Yes Vision/Perception     Cognition Overall Cognitive Status: No family/caregiver present to determine baseline cognitive functioning Arousal/Alertness: Awake/alert Orientation Level: Oriented X4 Year: 2024 Month: August Day of Week: Incorrect Attention: Sustained Sustained Attention: Appears intact Memory: Impaired Memory Impairment: Decreased short term memory Decreased Short Term Memory: Verbal complex;Functional complex Awareness: Appears intact Problem Solving: Impaired Problem Solving Impairment: Verbal complex;Functional complex Safety/Judgment: Appears intact Sensation Sensation Light Touch: Appears  Intact Coordination Gross Motor Movements are Fluid and Coordinated: No Fine Motor Movements are Fluid and Coordinated: No Heel Shin Test: unable to perform LLE. Motor  Motor Motor: Hemiplegia Motor - Skilled Clinical Observations: old hemiplegia with increased weakness on left side   Trunk/Postural Assessment  Cervical Assessment Cervical Assessment: Within Functional Limits Thoracic Assessment Thoracic Assessment: Within Functional Limits Lumbar Assessment Lumbar Assessment: Within Functional Limits Postural Control Postural Control: Deficits on evaluation Righting Reactions: delayed Protective Responses: delayed  Balance Balance Balance Assessed: Yes Dynamic Sitting Balance Dynamic Sitting - Balance Support: During functional activity Dynamic Sitting - Level of Assistance: 5: Stand by assistance Static Standing Balance Static Standing - Balance Support: During functional activity Static Standing - Level of Assistance: 5: Stand by assistance Dynamic Standing Balance Dynamic Standing - Balance Support: During functional activity Dynamic Standing - Level of Assistance: 4: Min assist Extremity Assessment      RLE Assessment RLE Assessment: Within Functional Limits LLE Assessment LLE Assessment: Exceptions to [facility] General Strength Comments: knee grossly 4/5, hip 2+/5.  Care Tool Care Tool Bed Mobility Roll left and right activity   Roll left and right assist level: Independent    Sit to lying activity   Sit to lying assist level: Contact Guard/Touching assist    Lying to sitting on side of bed activity   Lying to sitting on side of bed assist level: the ability to move from lying on the back to sitting on the side of the bed with no back support.: Contact Guard/Touching assist     Care Tool Transfers Sit to stand transfer   Sit to stand assist level: Minimal Assistance - Patient > 75%    Chair/bed transfer   Chair/bed transfer assist level: Minimal Assistance -  Patient > 75%     Psychologist, clinical transfer assist level: Contact Guard/Touching assist      Care Tool Locomotion Ambulation   Assist level: Contact Guard/Touching assist Assistive device: Walker-rolling Max distance: 90  Walk 10 feet activity   Assist level: Minimal Assistance -  Patient > 75% Assistive device: Walker-rolling   Walk 50 feet with 2 turns activity   Assist level: Minimal Assistance - Patient > 75% Assistive device: Walker-rolling  Walk 150 feet activity Walk 150 feet activity did not occur: Safety/medical concerns      Walk 10 feet on uneven surfaces activity   Assist level: Minimal Assistance - Patient > 75% Assistive device: Walker-rolling  Stairs   Assist level: Minimal Assistance - Patient > 75% Stairs assistive device: 2 hand rails Max number of stairs: 12  Walk up/down 1 step activity   Walk up/down 1 step (curb) assist level: Minimal Assistance - Patient > 75% Walk up/down 1 step or curb assistive device: 2 hand rails  Walk up/down 4 steps activity   Walk up/down 4 steps assist level: Minimal Assistance - Patient > 75% Walk up/down 4 steps assistive device: 2 hand rails  Walk up/down 12 steps activity   Walk up/down 12 steps assist level: Minimal Assistance - Patient > 75% Walk up/down 12 steps assistive device: 2 hand rails  Pick up small objects from floor   Pick up small object from the floor assist level: Contact Guard/Touching assist Pick up small object from the floor assistive device: no AD  Wheelchair Is the patient using a wheelchair?: Yes Type of Wheelchair: Manual   Wheelchair assist level: Supervision/Verbal cueing Max wheelchair distance: 150+  Wheel 50 feet with 2 turns activity   Assist Level: Supervision/Verbal cueing  Wheel 150 feet activity   Assist Level: Supervision/Verbal cueing    Refer to Care Plan for Long Term Goals  SHORT TERM GOAL WEEK 1 PT Short Term Goal 1 (Week 1): STG=LTGs 2/2  ELOS  Recommendations for other services: None   Skilled Therapeutic Intervention Evaluation completed (see details above and below) with education on PT POC and goals and individual treatment initiated with focus on  balance, NMR LLE, strengthening, gait, stairs.  Pt presents sitting in w/c and agreeable to therapy.  Pt performs w/c <> bed transfer w/ min A.  Pt wheeled w/c x 150+ using B UES and LES and supervision.  Pt amb x 10' w/ RW and min A/CGA to sedan height car simulator.  Pt transfers w/ CGA.  Pt negotiated up/down ramped surface w/ RW and min A.  Pt amb x 90' w/ RW and min to CGA, verbal cues for decreased hyperextension L knee as well as swing phase.  Pt performed TUG test for average of 30 seconds.  Discussed meaning of test w/ pt for increased risk of falls.  Pt amb w/o AD and min A, verbal cues for safe turns.  Pt negotiated 12 steps w/ B rails and min A, step-to gait pattern, verbal cues for BOS and complete step up to step.  Pt amb into BR and continent of bladder in toilet standing before.  Pt stood to wash hands and then amb w/ RW to bed w/ min A.  Pt transferred sit to supine w/ CGA.  Bed alarm on and all needs in reach.     Mobility Bed Mobility Bed Mobility: Rolling Right;Rolling Left;Sit to Sidelying Right;Right Sidelying to Sit Rolling Right: Independent with assistive device Rolling Left: Independent with assistive device Right Sidelying to Sit: Contact Guard/Touching assist Sit to Sidelying Right: Contact Guard/Touching assist Transfers Transfers: Sit to Stand;Stand to Sit;Stand Pivot Transfers Sit to Stand: Minimal Assistance - Patient > 75%;Contact Guard/Touching assist Stand to Sit: Contact Guard/Touching assist Stand Pivot Transfers: Minimal Assistance - Patient > 75% Stand  Pivot Transfer Details: Verbal cues for technique;Verbal cues for sequencing Transfer (Assistive device): None Locomotion  Gait Ambulation: Yes Gait Assistance: Minimal Assistance -  Patient > 75% Gait Distance (Feet): 90 Feet Assistive device: Rolling walker Gait Assistance Details: Verbal cues for gait pattern;Verbal cues for sequencing Gait Gait: Yes Gait Pattern: Decreased dorsiflexion - left;Decreased hip/knee flexion - left Stairs / Additional Locomotion Stairs: Yes Stairs Assistance: Minimal Assistance - Patient > 75% Stair Management Technique: Two rails Number of Stairs: 12 Height of Stairs: 6 Ramp: Minimal Assistance - Patient >75% Curb: Minimal Assistance - Patient >75% Wheelchair Mobility Wheelchair Mobility: Yes Wheelchair Assistance: Doctor, general practice: Both upper extremities;Both lower extermities Wheelchair Parts Management: Needs assistance Distance: 150+   Discharge Criteria: Patient will be discharged from PT if patient refuses treatment 3 consecutive times without medical reason, if treatment goals not met, if there is a change in medical status, if patient makes no progress towards goals or if patient is discharged from hospital.  The above assessment, treatment plan, treatment alternatives and goals were discussed and mutually agreed upon: by patient  Lucio Edward 06/03/2023, 3:45 PM

## 2023-06-03 NOTE — Plan of Care (Deleted)
  Problem: RH Swallowing Goal: LTG Patient will consume least restrictive diet using compensatory strategies with assistance (SLP) Description: LTG:  Patient will consume least restrictive diet using compensatory strategies with assistance (SLP) Flowsheets (Taken 06/03/2023 1248) LTG: Pt Patient will consume least restrictive diet using compensatory strategies with assistance of (SLP): Modified Independent   Problem: RH Expression Communication Goal: LTG Patient will increase speech intelligibility (SLP) Description: LTG: Patient will increase speech intelligibility at word/phrase/conversation level with cues, % of the time (SLP) Flowsheets (Taken 06/03/2023 1248) LTG: Patient will increase speech intelligibility (SLP): Modified Independent Level: Conversation level Percent of time patient will use intelligible speech: 90   Problem: RH Problem Solving Goal: LTG Patient will demonstrate problem solving for (SLP) Description: LTG:  Patient will demonstrate problem solving for basic/complex daily situations with cues  (SLP) Flowsheets (Taken 06/03/2023 1248) LTG: Patient will demonstrate problem solving for (SLP): (mildy complex) -- LTG Patient will demonstrate problem solving for: Modified Independent   Problem: RH Memory Goal: LTG Patient will use memory compensatory aids to (SLP) Description: LTG:  Patient will use memory compensatory aids to recall biographical/new, daily complex information with cues (SLP) Flowsheets (Taken 06/03/2023 1248) LTG: Patient will use memory compensatory aids to (SLP): Modified Independent

## 2023-06-03 NOTE — Progress Notes (Signed)
PROGRESS NOTE   Subjective/Complaints:  Pt working with OT in room, no new issues overnite, states that LLE "a little weaker" since most recent stroke but has used a walker at home since initial stroke  Discussed CVA risk factors or HTN, DM, and smoking.  Pt vows to quit smoking   ROS neg CP SOB, N/V/D  Objective:   No results found. Recent Labs    06/01/23 0330 06/02/23 0257  WBC 10.7* 10.5  HGB 14.4 14.0  HCT 44.9 43.7  PLT 319 321   Recent Labs    06/01/23 0330 06/02/23 0257  NA 137 137  K 3.4* 3.5  CL 103 103  CO2 25 25  GLUCOSE 96 99  BUN 20 20  CREATININE 0.73 0.72  CALCIUM 9.0 9.1    Intake/Output Summary (Last 24 hours) at 06/03/2023 0853 Last data filed at 06/03/2023 0809 Gross per 24 hour  Intake 476 ml  Output 1275 ml  Net -799 ml        Physical Exam: Vital Signs Blood pressure 130/85, pulse 66, temperature 98.5 F (36.9 C), temperature source Oral, resp. rate 18, height 5\' 6"  (1.676 m), weight 90.9 kg, SpO2 96%.    Assessment/Plan: 1. Functional deficits which require 3+ hours per day of interdisciplinary therapy in a comprehensive inpatient rehab setting. Physiatrist is providing close team supervision and 24 hour management of active medical problems listed below. Physiatrist and rehab team continue to assess barriers to discharge/monitor patient progress toward functional and medical goals  Care Tool:  Bathing              Bathing assist       Upper Body Dressing/Undressing Upper body dressing        Upper body assist      Lower Body Dressing/Undressing Lower body dressing            Lower body assist       Toileting Toileting    Toileting assist       Transfers Chair/bed transfer  Transfers assist           Locomotion Ambulation   Ambulation assist              Walk 10 feet activity   Assist           Walk 50 feet  activity   Assist           Walk 150 feet activity   Assist           Walk 10 feet on uneven surface  activity   Assist           Wheelchair     Assist               Wheelchair 50 feet with 2 turns activity    Assist            Wheelchair 150 feet activity     Assist          Blood pressure 130/85, pulse 66, temperature 98.5 F (36.9 C), temperature source Oral, resp. rate 18, height 5\' 6"  (1.676 m), weight 90.9 kg, SpO2 96%.  Medical Problem List and Plan:  1. 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              -patient may shower             -ELOS/Goals: 10-12 days S             Admit to CIR 2.  Antithrombotics: -DVT/anticoagulation:  Pharmaceutical: Lovenox             -antiplatelet therapy: DAPT X 3 weeks followed by ASA alone.  3. Diabetic neuropathy/Pain Management:  Continue gabapentin tid 4. Mood/Behavior/Sleep:  LCSW to follow for evaluation and support             --trazodone prn.             -antipsychotic agents: N/A 5. Neuropsych/cognition: This patient is not capable of making decisions on his own behalf. 6. Skin/Wound Care: Routine pressure relief measures.  7. Fluids/Electrolytes/Nutrition: Monitor I/O- check CMET in am. 8. HTN: Monitor BP TID- continue to hold amlodipine and hyzaar            Vitals:   06/02/23 1936 06/03/23 0328  BP: (!) 142/82 130/85  Pulse: 79 66  Resp: 16 18  Temp: 98.4 F (36.9 C) 98.5 F (36.9 C)  SpO2: 94% 96%  Good range thus far 8/15  -will add magnesium gluconate 250mg  HS. 9. T2DM: Better control with Hgb A1C-8.5( 02/24)-->6.5 @ admission. Monitor BS ac/hs             --SSI for elevated BS             --On farxiga daily CBG (last 3)  Recent Labs    06/01/23 2123 06/02/23 0849 06/02/23 1100  GLUCAP 104* 107* 126*  Controlled 8/15 10. Intermittent hypokalemia: Recheck in am. 11. Constipation: continue colace and miralax.  12.  Tobacco/Marijuana use: Educate on importance of cessation.  13. COPD?: I PPD smoker PTA. Congested cough with rales--will order CXR for work up. Incentive spirometer ordered.    14. Screening for vitamin D deficiency: check vitamin D level tomorrow.    LOS: 1 days A FACE TO FACE EVALUATION WAS PERFORMED  Erick Colace 06/03/2023, 8:53 AM

## 2023-06-03 NOTE — Progress Notes (Signed)
Inpatient Rehabilitation  Patient information reviewed and entered into eRehab system by Melissa M. Bowie, M.A., CCC/SLP, PPS Coordinator.  Information including medical coding, functional ability and quality indicators will be reviewed and updated through discharge.    

## 2023-06-04 LAB — GLUCOSE, CAPILLARY
Glucose-Capillary: 99 mg/dL (ref 70–99)
Glucose-Capillary: 84 mg/dL (ref 70–99)

## 2023-06-04 MED ORDER — TROLAMINE SALICYLATE 10 % EX CREA: TOPICAL_CREAM | Freq: Two times a day (BID) | CUTANEOUS | Status: DC | PRN

## 2023-06-04 NOTE — IPOC Note (Signed)
Overall Plan of Care [facility]) Patient Details Name: Bradley Morrison MRN: [id number] DOB: 02-19-1976  Admitting Diagnosis: Cerebrovascular accident (CVA) of pontine structure [facility])  Hospital Problems: Principal Problem:   Cerebrovascular accident (CVA) of pontine structure (HCC)     Functional Problem List: Nursing Bladder, Bowel, Safety, Endurance, Medication Management, Motor  PT Balance, Safety, Endurance, Motor  OT Balance, Endurance, Motor, Pain, Safety  SLP Cognition, Nutrition  TR         Basic ADL's: OT Bathing, Dressing, Toileting     Advanced  ADL's: OT       Transfers: PT Bed Mobility, Bed to Chair, Car, Furniture, Floor  OT Toilet, Tub/Shower     Locomotion: PT Ambulation, Psychologist, prison and probation services, Stairs     Additional Impairments: OT None  SLP Swallowing, Social Cognition, Communication expression Problem Solving, Memory  TR      Anticipated Outcomes Item Anticipated Outcome  Self Feeding n/a  Swallowing  modI   Basic self-care  mod I  Toileting  mod I   Bathroom Transfers mod I  Bowel/Bladder  regain control of bowel/bladder  Transfers  Mod I  Locomotion  Mod I w/ LRAD  Communication  modI  Cognition  modI  Pain  less than 3  Safety/Judgment  no falls   Therapy Plan: PT Intensity: Minimum of 1-2 x/day ,45 to 90 minutes PT Frequency: 5 out of 7 days PT Duration Estimated Length of Stay: 7-9 days OT Intensity: Minimum of 1-2 x/day, 45 to 90 minutes OT Frequency: 5 out of 7 days OT Duration/Estimated Length of Stay: ~ 7-9 days SLP Intensity: Minumum of 1-2 x/day, 30 to 90 minutes SLP Frequency: 1 to 3 out of 7 days SLP Duration/Estimated Length of Stay: 12-14   Team Interventions: Nursing Interventions Patient/Family Education, Dysphagia/Aspiration Precaution Training, Bladder Management, Bowel Management, Medication Management, Discharge Planning, Psychosocial Support, Disease Management/Prevention  PT interventions Ambulation/gait  training, Community reintegration, Neuromuscular re-education, Stair training, UE/LE Strength taining/ROM, Wheelchair propulsion/positioning, Warden/ranger, Discharge planning, Therapeutic Activities, UE/LE Coordination activities, Therapeutic Exercise, Patient/family education, Functional mobility training  OT Interventions Balance/vestibular training, Neuromuscular re-education, Self Care/advanced ADL retraining, Therapeutic Exercise, DME/adaptive equipment instruction, Pain management, Skin care/wound managment, UE/LE Strength taining/ROM, Community reintegration, Equities trader education, Splinting/orthotics, UE/LE Coordination activities, Discharge planning, Functional mobility training, Psychosocial support, Therapeutic Activities  SLP Interventions Cognitive remediation/compensation, Dysphagia/aspiration precaution training, Internal/external aids, Speech/Language facilitation, Cueing hierarchy, Therapeutic Activities, Functional tasks, Patient/family education  TR Interventions    SW/CM Interventions Discharge Planning, Psychosocial Support, Patient/Family Education, Disease Management/Prevention   Barriers to Discharge MD  Medical stability  Nursing Decreased caregiver support, Home environment access/layout, Incontinence, Weight, Medication compliance home with mom 1 level 5 ste entry rail on left  PT Inaccessible home environment, Home environment access/layout    OT      SLP      SW Decreased caregiver support, Lack of/limited family support Mother works   Team Discharge Planning: Destination: PT-Home ,OT- Home , SLP-Home Projected Follow-up: PT-Outpatient PT, OT-  Home health OT, SLP-Outpatient SLP Projected Equipment Needs: PT-To be determined, OT- None recommended by OT, SLP-None recommended by SLP Equipment Details: PT- , OT-  Patient/family involved in discharge planning: PT- Patient,  OT-Patient, SLP-Patient  MD ELOS: 10 to 14 days Medical Rehab Prognosis:   Good Assessment: The patient has been admitted for CIR therapies with the diagnosis of RIght pontine infarct. The team will be addressing functional mobility, strength, stamina, balance, safety, adaptive techniques and equipment, self-care, bowel and bladder mgt, patient and  caregiver education, diabetic management. Goals have been set at Mod I. Anticipated discharge destination is Home.        See Team Conference Notes for weekly updates to the plan of care

## 2023-06-04 NOTE — Progress Notes (Addendum)
PROGRESS NOTE   Subjective/Complaints:  Requesting grounds pass , wants his dog to visit  Some Left hip pain points to lateral   ROS neg CP SOB, N/V/D  Objective:   No results found. Recent Labs    06/02/23 0257 06/03/23 0929  WBC 10.5 10.3  HGB 14.0 15.0  HCT 43.7 46.8  PLT 321 355   Recent Labs    06/02/23 0257 06/03/23 0929  NA 137 138  K 3.5 3.6  CL 103 104  CO2 25 26  GLUCOSE 99 151*  BUN 20 16  CREATININE 0.72 1.02  CALCIUM 9.1 9.2    Intake/Output Summary (Last 24 hours) at 06/04/2023 0843 Last data filed at 06/04/2023 0811 Gross per 24 hour  Intake 1340 ml  Output 925 ml  Net 415 ml        Physical Exam: Vital Signs Blood pressure 90/60, pulse 63, temperature 97.9 F (36.6 C), temperature source Oral, resp. rate 18, height 5\' 6"  (1.676 m), weight 90.9 kg, SpO2 94%.  General: No acute distress Mood and affect are appropriate Heart: Regular rate and rhythm no rubs murmurs or extra sounds Lungs: Clear to auscultation, breathing unlabored, no rales or wheezes Abdomen: Positive bowel sounds, soft nontender to palpation, nondistended Extremities: No clubbing, cyanosis, or edema Skin: No evidence of breakdown, no evidence of rash Neurologic: Cranial nerves II through XII intact, motor strength is 5/5 in bilateral deltoid, bicep, tricep, grip, 5/5 RIght and 3- left hip flexor, knee extensors, ankle dorsiflexor and plantar flexor Sensory exam normal sensation to light touch and proprioception in bilateral upper and lower extremities Cerebellar exam normal finger to nose to finger as well as heel to shin in bilateral upper and lower extremities Musculoskeletal: Full range of motion in all 4 extremities. No joint swelling , no pain with L hip ROM, + pain over greater troch    Assessment/Plan: 1. Functional deficits which require 3+ hours per day of interdisciplinary therapy in a comprehensive  inpatient rehab setting. Physiatrist is providing close team supervision and 24 hour management of active medical problems listed below. Physiatrist and rehab team continue to assess barriers to discharge/monitor patient progress toward functional and medical goals  Care Tool:  Bathing    Body parts bathed by patient: Right arm, Left arm, Chest, Abdomen, Front perineal area, Buttocks, Right upper leg, Left upper leg, Right lower leg, Left lower leg, Face         Bathing assist Assist Level: Contact Guard/Touching assist     Upper Body Dressing/Undressing Upper body dressing   What is the patient wearing?: Pull over shirt    Upper body assist Assist Level: Set up assist    Lower Body Dressing/Undressing Lower body dressing      What is the patient wearing?: Pants, Incontinence brief     Lower body assist Assist for lower body dressing: Minimal Assistance - Patient > 75%     Toileting Toileting    Toileting assist Assist for toileting: Contact Guard/Touching assist     Transfers Chair/bed transfer  Transfers assist     Chair/bed transfer assist level: Minimal Assistance - Patient > 75%     Locomotion Ambulation  Ambulation assist      Assist level: Contact Guard/Touching assist Assistive device: Walker-rolling Max distance: 90   Walk 10 feet activity   Assist     Assist level: Minimal Assistance - Patient > 75% Assistive device: Walker-rolling   Walk 50 feet activity   Assist    Assist level: Minimal Assistance - Patient > 75% Assistive device: Walker-rolling    Walk 150 feet activity   Assist Walk 150 feet activity did not occur: Safety/medical concerns         Walk 10 feet on uneven surface  activity   Assist     Assist level: Minimal Assistance - Patient > 75% Assistive device: Walker-rolling   Wheelchair     Assist Is the patient using a wheelchair?: Yes Type of Wheelchair: Manual    Wheelchair assist level:  Supervision/Verbal cueing Max wheelchair distance: 150+    Wheelchair 50 feet with 2 turns activity    Assist        Assist Level: Supervision/Verbal cueing   Wheelchair 150 feet activity     Assist      Assist Level: Supervision/Verbal cueing   Blood pressure 90/60, pulse 63, temperature 97.9 F (36.6 C), temperature source Oral, resp. rate 18, height 5\' 6"  (1.676 m), weight 90.9 kg, SpO2 94%.  Medical Problem List and Plan: 1. 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              -patient may shower             -ELOS/Goals: 10-12 days S             Admit to CIR 2.  Antithrombotics: -DVT/anticoagulation:  Pharmaceutical: Lovenox             -antiplatelet therapy: DAPT X 3 weeks followed by ASA alone.  3. Diabetic neuropathy/Pain Management:  Continue gabapentin tid 4. Mood/Behavior/Sleep:  LCSW to follow for evaluation and support             --trazodone prn.             -antipsychotic agents: N/A 5. Neuropsych/cognition: This patient is not capable of making decisions on his own behalf. 6. Skin/Wound Care: Routine pressure relief measures.  7. Fluids/Electrolytes/Nutrition: Monitor I/O- check CMET in am. 8. HTN: Monitor BP TID- continue to hold amlodipine and hyzaar            Vitals:   06/03/23 1951 06/04/23 0445  BP: 110/78 90/60  Pulse: 69 63  Resp: 18 18  Temp: 98.8 F (37.1 C) 97.9 F (36.6 C)  SpO2: 98% 94%  Good range thus far 8/15  -will add magnesium gluconate 250mg  HS. 9. T2DM: Better control with Hgb A1C-8.5( 02/24)-->6.5 @ admission. Monitor BS ac/hs             --SSI for elevated BS             --On farxiga daily CBG (last 3)  Recent Labs    06/03/23 1632 06/03/23 2052 06/04/23 0611  GLUCAP 96 123* 99  Controlled 8/15 10. Intermittent hypokalemia: Recheck in am. 11. Constipation: continue colace and miralax.  12. Tobacco/Marijuana use: Educate on importance of cessation.  13. COPD?: I PPD smoker  PTA. Congested cough with rales--will order CXR for work up. Incentive spirometer ordered.    14. Screening for vitamin D deficiency: check vitamin D level tomorrow.  15.  Troch bursitis Left sportscreme and PT   LOS: 2 days A FACE  TO FACE EVALUATION WAS PERFORMED  Erick Colace 06/04/2023, 8:43 AM

## 2023-06-04 NOTE — Progress Notes (Signed)
Occupational Therapy Session Note  Patient Details  Name: Bradley Morrison MRN: [id number] Date of Birth: [birth year]  Today's Date: 06/04/2023 OT Individual Time: 5284-1324 OT Individual Time Calculation (min): 49 min    Short Term Goals: Week 1:  OT Short Term Goal 1 (Week 1): LTG=STG  Skilled Therapeutic Interventions/Progress Updates:  Pt greeted seated in w/c, pt agreeable to OT intervention. Session focus on BADL reeducation, functional mobility, dynamic standing balance and decreasing overall caregiver burden.        Pt retrieved clothes from suticase from w/c with supervision, pt completed ambulatory transfer into bathroom for shower with RW and CGA. Pt sat on toilet to doff clothes with supervision. Pt entered shower holding onto grab bars with CGA. Pt completed bathing seated on seat with close supervision. Exited shower with same level of assist. Pt completed dressing from toilet with overall supervision, good recall of hemi strategies ( I.e dressing affected extremity first). Pt exited bathroom with RW and CGA. Pt stood at sink for grooming for > with no UE support or LOB, distant supervision.      Pt donned shoes and socks from w/c with supervision.       Ended session with pt seated in w/c with all needs within reach and chair alarm activated.                    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     Therapy/Group: Individual Therapy  Pollyann Glen [facility] 06/04/2023, 12:11 PM

## 2023-06-04 NOTE — Progress Notes (Signed)
Speech Language Pathology Daily Session Note  Patient Details  Name: ASWAD MCMANUS MRN: [id number] Date of Birth: 04-23-1976  Today's Date: 06/04/2023 SLP Individual Time: 0900-1000 SLP Individual Time Calculation (min): 60 min  Short Term Goals: Week 1: SLP Short Term Goal 1 (Week 1): Patient will consume least restrictive diet utilizing compensatory with no overt s/sx of aspiration with min verbal A SLP Short Term Goal 2 (Week 1): Patient will increase speech intelligibility to 90% at the conversational level utilizing compensatory strategies with min verbal A SLP Short Term Goal 3 (Week 1): Patient will demonstrate problem solving skills during mildly complex functional tasks with min multimodal A SLP Short Term Goal 4 (Week 1): Patient will recall and utilize memory compensatory strategies with mulitmodal minA  Skilled Therapeutic Interventions: Skilled therapy session focused on dysphagia and cognitive goals. SLP observed trials of dys 3 solids, thin liquids and medications whole in puree. Patient required min A for strategies including small bites/sips and chin tuck during boluses of thin liquids. No overt s/sx of aspiration. Timely mastication and oral clearance. Upgrade diet to dys 3 with thin liquids. Medications administered whole in puree. RN notified of changes. Intermittent supervision. SLP addressed cognitive goals during medication management task. Patient utilized problem solving and organizational skills to complete TID pill box with min verbal A. Patient continent of bladder during session. Left in [facility] with alarm set and call bell in reach. Continue POC.  Pain No pain reported   Therapy/Group: Individual Therapy  Jamyson Jirak M.A., CF-SLP 06/04/2023, 12:24 PM

## 2023-06-04 NOTE — Progress Notes (Signed)
Inpatient Rehabilitation Care Coordinator Assessment and Plan Patient Details  Name: Bradley Morrison MRN: [id number] Date of Birth: 1976-01-15  Today's Date: 06/04/2023  Hospital Problems: Principal Problem:   Cerebrovascular accident (CVA) of pontine structure [facility])  Past Medical History:  Past Medical History:  Diagnosis Date   Anxiety    CP (cerebral palsy) (HCC)    Hypertension    Stroke (HCC) 05/18/2023   Past Surgical History: History reviewed. No pertinent surgical history. Social History:  reports that he has been smoking cigarettes. He has never used smokeless tobacco. He reports that he does not drink alcohol and does not use drugs.  Family / Support Systems Marital Status: Single Spouse/Significant Other: n/a Children: n/a Other Supports: Mother, Elnita Maxwell and Fleet Contras, friend Anticipated Caregiver: Elnita Maxwell, Mother Ability/Limitations of Caregiver: Mother works at [employer] Caregiver Availability: Other (Comment) Family Dynamics: Support from mother  Social History Preferred language: English Religion: Baptist Cultural Background: Independent and staying with friend. Education: HS Health Literacy - How often do you need to have someone help you when you read instructions, pamphlets, or other written material from your doctor or pharmacy?: Never Writes: Yes Legal History/Current Legal Issues: n/a Guardian/Conservator: n/a   Abuse/Neglect Abuse/Neglect Assessment Can Be Completed: Yes Physical Abuse: Denies Verbal Abuse: Denies Sexual Abuse: Denies Exploitation of patient/patient's resources: Denies Self-Neglect: Denies  Patient response to: Social Isolation - How often do you feel lonely or isolated from those around you?: Never  Emotional Status Pt's affect, behavior and adjustment status: Plesant and eager to participate Recent Psychosocial Issues: Coping Psychiatric History: Anxiety Substance Abuse History: N/A  Patient / Family Perceptions, Expectations &  Goals Pt/Family understanding of illness & functional limitations: yes, and mother called at bedside Premorbid pt/family roles/activities: Independent and driving Anticipated changes in roles/activities/participation: Mother Pt/family expectations/goals: Sup/Min  [employer]: None Premorbid Home Care/DME Agencies: Other (Comment) (RW) Transportation available at discharge: mother or friend Is the patient able to respond to transportation needs?: Yes In the past 12 months, has lack of transportation kept you from medical appointments or from getting medications?: No In the past 12 months, has lack of transportation kept you from meetings, work, or from getting things needed for daily living?: No Resource referrals recommended: Neuropsychology  Discharge Planning Living Arrangements: Parent Support Systems: Parent, Friends/neighbors Type of Residence: Private residence Insurance Resources: Media planner (specify), Medicaid (specify county) Engineer, materials) Surveyor, quantity Resources: Family Support Financial Screen Referred: No Living Expenses: Lives with family Money Management: Patient, Family Does the patient have any problems obtaining your medications?: No Home Management: Independent Patient/Family Preliminary Plans: Patient plans to remain independent, mother able to assis if needed PRN. Care Coordinator Barriers to Discharge: Decreased caregiver support, Lack of/limited family support Care Coordinator Barriers to Discharge Comments: Mother works [employer]aff[employer] Anticipated Follow Up Needs: HH/OP Expected length of stay: 12-14 Days  Clinical Impression SW met with patient and called mother at bedside. Introduced self and explained role. Patient discharging home with mother in [place], [place]. 5 steps to enter the home. Mother does work during the day during the week. SW will follow up with pt and mother on recommendations. No additional questions or  concerns.   Andria Rhein 06/04/2023, 2:13 PM

## 2023-06-04 NOTE — Progress Notes (Signed)
Physical Therapy Session Note  Patient Details  Name: SHANN HODGKINSON MRN: [id number] Date of Birth: 05-22-1976  Today's Date: 06/04/2023 PT Individual Time: 1324-4010 PT Individual Time Calculation (min): 90 min   Short Term Goals: Week 1:  PT Short Term Goal 1 (Week 1): STG=LTGs 2/2 ELOS  Skilled Therapeutic Interventions/Progress Updates: Patient in [facility] on entrance to room. Patient alert and agreeable to PT session.   Patient reported no pain at beginning of PT session.  Therapeutic Activity: Transfers: Pt performed sit<>stand transfers throughout session with close supervision/CGA for safety and with RW or no AD. Pt demos anterior scoot without assistance, and to reach back to surfaces without VC to control descent.   - Patient demonstrates increased fall risk as noted by score of 5/30 on  Functional Gait Assessment.   <22/30 = predictive of falls, <20/30 = fall in 6 months, <18/30 = predictive of falls in PD MCID: 5 points stroke population, 4 points geriatric population (ANPTA Core Set of Outcome Measures for Adults with Neurologic Conditions, 2018)  - Patient demonstrates increased fall risk as noted by score of   34/56 on Berg Balance Scale.  (<36= high risk for falls, close to 100%; 37-45 significant >80%; 46-51 moderate >50%; 52-55 lower >25%)  Neuromuscular Re-ed: NMR facilitated during session with focus on dynamic standing balance and, step clearance, and stepping strategies. - dynamic standing within numbered dots (1-7) with cues to tap numbers with R LE. Pt initially to step to multiples of 6, then counting down sequentially in order to challenge cognitive load. Pt with CGA and minA throughout to maintain balance.  - 5 (6") hurdles with patient leading with L LE first. Pt was educated on stepping strategies per presentation of dot stepping intervention of trying to catch balance with B UE's only (pt verbally cued to step, pause to find balance, and then to bring other LE over  - pt knocked on over, but was still able to clear after correction and noted to perform stepping strategies required to maintain balance when it occurred; however, still required minA for safety and to maintain standing position). Pt then ambulated back down leading with R LE to increase stance time on L LE (pt required increased assistance to heavy min/mod as pt knocked over first 3 hurdles due to not being able to clear L LE over hurdle, but demos clearing last two hurdles with knocking them down).   NMR performed for improvements in motor control and coordination, balance, sequencing, judgement, and self confidence/ efficacy in performing all aspects of mobility at highest level of independence.   Patient in [facility] at end of session with brakes locked, chair alarm set, and all needs within reach.      Therapy Documentation Precautions:  Precautions Precautions: Fall Precaution Comments: old residual left hemiparesis Restrictions Weight Bearing Restrictions: No  Balance: Balance Balance Assessed: Yes Standardized Balance Assessment Standardized Balance Assessment: Functional Gait Assessment Berg Balance Test Sit to Stand: Able to stand  independently using hands Standing Unsupported: Able to stand 30 seconds unsupported Sitting with Back Unsupported but Feet Supported on Floor or Stool: Able to sit safely and securely 2 minutes Stand to Sit: Sits safely with minimal use of hands Transfers: Able to transfer safely, definite need of hands Standing Unsupported with Eyes Closed: Able to stand 10 seconds with supervision Standing Ubsupported with Feet Together: Able to place feet together independently and stand for 1 minute with supervision From Standing, Reach Forward with Outstretched Arm: Can reach  forward >12 cm safely (5") From Standing Position, Pick up Object from Floor: Able to pick up shoe, needs supervision From Standing Position, Turn to Look Behind Over each Shoulder: Looks  behind one side only/other side shows less weight shift Turn 360 Degrees: Needs close supervision or verbal cueing Standing Unsupported, Alternately Place Feet on Step/Stool: Able to complete >2 steps/needs minimal assist Standing Unsupported, One Foot in Front: Needs help to step but can hold 15 seconds Standing on One Leg: Unable to try or needs assist to prevent fall Total Score: 34 Functional Gait  Assessment Gait assessed : Yes Gait Level Surface: Walks 20 ft, slow speed, abnormal gait pattern, evidence for imbalance or deviates 10-15 in outside of the 12 in walkway width. Requires more than 7 sec to ambulate 20 ft. Change in Gait Speed: Makes only minor adjustments to walking speed, or accomplishes a change in speed with significant gait deviations, deviates 10-15 in outside the 12 in walkway width, or changes speed but loses balance but is able to recover and continue walking. Gait with Horizontal Head Turns: Performs head turns with moderate changes in gait velocity, slows down, deviates 10-15 in outside 12 in walkway width but recovers, can continue to walk. Gait with Vertical Head Turns: Performs task with moderate change in gait velocity, slows down, deviates 10-15 in outside 12 in walkway width but recovers, can continue to walk. Gait and Pivot Turn: Cannot turn safely, requires assistance to turn and stop. Step Over Obstacle: Cannot perform without assistance. Gait with Narrow Base of Support: Ambulates less than 4 steps heel to toe or cannot perform without assistance. Gait with Eyes Closed: Cannot walk 20 ft without assistance, severe gait deviations or imbalance, deviates greater than 15 in outside 12 in walkway width or will not attempt task. Ambulating Backwards: Walks 20 ft, slow speed, abnormal gait pattern, evidence for imbalance, deviates 10-15 in outside 12 in walkway width. Steps: Two feet to a stair, must use rail. Total Score: 5  Therapy/Group: Individual  Therapy  Donnia Poplaski PTA 06/04/2023, 3:29 PM

## 2023-06-04 NOTE — Plan of Care (Signed)
  Problem: RH BOWEL ELIMINATION Goal: RH STG MANAGE BOWEL WITH ASSISTANCE Description: STG Manage Bowel with min Assistance. Outcome: Progressing   Problem: Consults Goal: RH STROKE PATIENT EDUCATION Description: See Patient Education module for education specifics  Outcome: Progressing   Problem: RH BOWEL ELIMINATION Goal: RH STG MANAGE BOWEL W/MEDICATION W/ASSISTANCE Description: STG Manage Bowel with Medication with min Assistance. Outcome: Progressing   Problem: RH BLADDER ELIMINATION Goal: RH STG MANAGE BLADDER WITH ASSISTANCE Description: STG Manage Bladder With min Assistance Outcome: Progressing

## 2023-06-05 LAB — GLUCOSE, CAPILLARY: Glucose-Capillary: 93 mg/dL (ref 70–99)

## 2023-06-05 NOTE — Progress Notes (Signed)
Physical Therapy Session Note  Patient Details  Name: Bradley Morrison MRN: [id number] Date of Birth: [birth year]  Today's Date: 06/05/2023 PT Individual Time: 1110-1205 PT Individual Time Calculation (min): 55 min   Short Term Goals: Week 1:  PT Short Term Goal 1 (Week 1): STG=LTGs 2/2 ELOS  Skilled Therapeutic Interventions/Progress Updates: Patient in [facility] on entrance to room. Patient alert and agreeable to PT session.   Patient reported no pain at beginning of PT session, and stated waking up multiple times throughout the night.   Therapeutic Activity: Transfers: Pt performed sit<>stand transfers throughout session with RW and with supervision for safety. No VC required.   Gait Training:  Pt ambulated 180' around nsg and day room gym using RW and with CGA/supervision and WC follow for safety. Pt noted to have genu recurvatum on L LE, decreased step length, forward flexed posture and downward gaze (VC to extend through hips and look forward ahead). Pt rested after returning to day room and performed NMRE to address L hyperextension.  - Pt ambulated around nsg and day room using RW and with supervision (no WC follow). Pt instructed to control L quad musculature to prevent hyperextension with noted carryover from NMRE. Pt hyperextended less than 20% of the time initially, but increased hyperextension towards end 2/2 fatigue. Pt also cued to perform heel strike to increase challenge of controlling required musculature with mod cues for sequence.   Neuromuscular Re-ed: NMR facilitated during session with focus on L knee neuromuscular control. - Pt standing in RW with therapist pulling L knee into flexion using green theraband with instructions for pt to control concentric and eccentric. Pt performed multiple reps and stated if there is anything he can do in room to help. PTA tied green theraband to frame of L castor wheel in WC and had pt demo donning/doffing band with shoes off to ensure safety  (no anterior LOB noted and pt performed safely). Pt instructed to perform controled LAQ with L LE and to pause at full extension for 2-3 seconds, and to control eccentric (perform 2-3 sets of 10-15 daily). - Pt standing in RW with PTA pulling knee into extension for error augmentation with cues for pt to control eccentric. Performed multiple reps and then pt rested prior to ambulatory trial.   NMR performed for improvements in motor control and coordination, balance, sequencing, judgement, and self confidence/ efficacy in performing all aspects of mobility at highest level of independence.   Patient in [facility] at end of session with brakes locked, chair alarm set, and all needs within reach.      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

## 2023-06-05 NOTE — Plan of Care (Signed)
  Problem: Consults Goal: RH STROKE PATIENT EDUCATION Description: See Patient Education module for education specifics  Outcome: Progressing   Problem: RH BOWEL ELIMINATION Goal: RH STG MANAGE BOWEL WITH ASSISTANCE Description: STG Manage Bowel with min Assistance. Outcome: Progressing Goal: RH STG MANAGE BOWEL W/MEDICATION W/ASSISTANCE Description: STG Manage Bowel with Medication with min Assistance. Outcome: Progressing   Problem: RH BLADDER ELIMINATION Goal: RH STG MANAGE BLADDER WITH ASSISTANCE Description: STG Manage Bladder With min Assistance Outcome: Progressing   Problem: RH SAFETY Goal: RH STG ADHERE TO SAFETY PRECAUTIONS W/ASSISTANCE/DEVICE Description: STG Adhere to Safety Precautions With cueing Assistance/Device. Outcome: Progressing Goal: RH STG DECREASED RISK OF FALL WITH ASSISTANCE Description: STG Decreased Risk of Fall With cueing Assistance. Outcome: Progressing   Problem: RH PAIN MANAGEMENT Goal: RH STG PAIN MANAGED AT OR BELOW PT'S PAIN GOAL Description: Pain will be managed at 3 out of 10 on pain scale with PRN medications min assist  Outcome: Progressing   Problem: RH KNOWLEDGE DEFICIT Goal: RH STG INCREASE KNOWLEGDE OF HYPERLIPIDEMIA Description: Patient/caregiver will be able to manage cholesterol medications and diet modifications to improve HDL levels from nursing handouts and nursing education independently  Outcome: Progressing Goal: RH STG INCREASE KNOWLEDGE OF STROKE PROPHYLAXIS Description: Patient/caregiver will be able to manage stroke medications (ASA/Plavix) from nursing education and nursing handouts independently  Outcome: Progressing   Problem: Education: Goal: Ability to describe self-care measures that may prevent or decrease complications (Diabetes Survival Skills Education) will improve Outcome: Progressing Goal: Individualized Educational Video(s) Outcome: Progressing   Problem: Coping: Goal: Ability to adjust to condition  or change in health will improve Outcome: Progressing   Problem: Fluid Volume: Goal: Ability to maintain a balanced intake and output will improve Outcome: Progressing   Problem: Health Behavior/Discharge Planning: Goal: Ability to identify and utilize available resources and services will improve Outcome: Progressing Goal: Ability to manage health-related needs will improve Outcome: Progressing   Problem: Metabolic: Goal: Ability to maintain appropriate glucose levels will improve Outcome: Progressing   Problem: Nutritional: Goal: Maintenance of adequate nutrition will improve Outcome: Progressing Goal: Progress toward achieving an optimal weight will improve Outcome: Progressing   Problem: Skin Integrity: Goal: Risk for impaired skin integrity will decrease Outcome: Progressing   Problem: Tissue Perfusion: Goal: Adequacy of tissue perfusion will improve Outcome: Progressing

## 2023-06-05 NOTE — Progress Notes (Signed)
PROGRESS NOTE   Subjective/Complaints:  No issues overnite , seen in PT, working to prevent Left knee hyperext   ROS neg CP SOB, N/V/D  Objective:   No results found. Recent Labs    06/03/23 0929  WBC 10.3  HGB 15.0  HCT 46.8  PLT 355   Recent Labs    06/03/23 0929  NA 138  K 3.6  CL 104  CO2 26  GLUCOSE 151*  BUN 16  CREATININE 1.02  CALCIUM 9.2    Intake/Output Summary (Last 24 hours) at 06/05/2023 1130 Last data filed at 06/05/2023 0819 Gross per 24 hour  Intake 1655 ml  Output 800 ml  Net 855 ml        Physical Exam: Vital Signs Blood pressure (!) 127/91, pulse 65, temperature 98 F (36.7 C), temperature source Oral, resp. rate 18, height 5\' 6"  (1.676 m), weight 90.9 kg, SpO2 98%.  General: No acute distress Mood and affect are appropriate Heart: Regular rate and rhythm no rubs murmurs or extra sounds Lungs: Clear to auscultation, breathing unlabored, no rales or wheezes Abdomen: Positive bowel sounds, soft nontender to palpation, nondistended Extremities: No clubbing, cyanosis, or edema Skin: No evidence of breakdown, no evidence of rash Neurologic: Cranial nerves II through XII intact, motor strength is 5/5 in bilateral deltoid, bicep, tricep, grip, 5/5 RIght and 3- left hip flexor, knee extensors, ankle dorsiflexor and plantar flexor Sensory exam normal sensation to light touch and proprioception in bilateral upper and lower extremities Cerebellar exam normal finger to nose to finger as well as heel to shin in bilateral upper and lower extremities Musculoskeletal: Full range of motion in all 4 extremities. No joint swelling , no pain with L hip ROM, + pain over greater troch    Assessment/Plan: 1. Functional deficits which require 3+ hours per day of interdisciplinary therapy in a comprehensive inpatient rehab setting. Physiatrist is providing close team supervision and 24 hour management of  active medical problems listed below. Physiatrist and rehab team continue to assess barriers to discharge/monitor patient progress toward functional and medical goals  Care Tool:  Bathing    Body parts bathed by patient: Right arm, Left arm, Chest, Abdomen, Front perineal area, Buttocks, Right upper leg, Left upper leg, Right lower leg, Left lower leg, Face         Bathing assist Assist Level: Supervision/Verbal cueing     Upper Body Dressing/Undressing Upper body dressing   What is the patient wearing?: Pull over shirt    Upper body assist Assist Level: Set up assist    Lower Body Dressing/Undressing Lower body dressing      What is the patient wearing?: Underwear/pull up, Pants     Lower body assist Assist for lower body dressing: Supervision/Verbal cueing     Toileting Toileting    Toileting assist Assist for toileting: Contact Guard/Touching assist     Transfers Chair/bed transfer  Transfers assist     Chair/bed transfer assist level: Supervision/Verbal cueing     Locomotion Ambulation   Ambulation assist      Assist level: Contact Guard/Touching assist Assistive device: Walker-rolling Max distance: 90   Walk 10 feet activity  Assist     Assist level: Minimal Assistance - Patient > 75% Assistive device: Walker-rolling   Walk 50 feet activity   Assist    Assist level: Minimal Assistance - Patient > 75% Assistive device: Walker-rolling    Walk 150 feet activity   Assist Walk 150 feet activity did not occur: Safety/medical concerns         Walk 10 feet on uneven surface  activity   Assist     Assist level: Minimal Assistance - Patient > 75% Assistive device: Walker-rolling   Wheelchair     Assist Is the patient using a wheelchair?: Yes Type of Wheelchair: Manual    Wheelchair assist level: Supervision/Verbal cueing Max wheelchair distance: 150+    Wheelchair 50 feet with 2 turns activity    Assist         Assist Level: Supervision/Verbal cueing   Wheelchair 150 feet activity     Assist      Assist Level: Supervision/Verbal cueing   Blood pressure (!) 127/91, pulse 65, temperature 98 F (36.7 C), temperature source Oral, resp. rate 18, height 5\' 6"  (1.676 m), weight 90.9 kg, SpO2 98%.  Medical Problem List and Plan: 1. 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              -patient may shower             -ELOS/Goals: 10-12 days S             Admit to CIR 2.  Antithrombotics: -DVT/anticoagulation:  Pharmaceutical: Lovenox             -antiplatelet therapy: DAPT X 3 weeks followed by ASA alone.  3. Diabetic neuropathy/Pain Management:  Continue gabapentin tid 4. Mood/Behavior/Sleep:  LCSW to follow for evaluation and support             --trazodone prn.             -antipsychotic agents: N/A 5. Neuropsych/cognition: This patient is not capable of making decisions on his own behalf. 6. Skin/Wound Care: Routine pressure relief measures.  7. Fluids/Electrolytes/Nutrition: Monitor I/O- check CMET in am. 8. HTN: Monitor BP TID- continue to hold amlodipine and hyzaar            Vitals:   06/04/23 1937 06/05/23 0331  BP: 127/86 (!) 127/91  Pulse: 80 65  Resp: 18 18  Temp: 99.6 F (37.6 C) 98 F (36.7 C)  SpO2: 96% 98%  Good range thus far 8/17  -will add magnesium gluconate 250mg  HS. 9. T2DM: Better control with Hgb A1C-8.5( 02/24)-->6.5 @ admission. Monitor BS ac/hs             --SSI for elevated BS             --On farxiga daily CBG (last 3)  Recent Labs    06/04/23 0611 06/04/23 1213 06/05/23 0612  GLUCAP 99 84 93  Controlled 8/17 10. Intermittent hypokalemia: Recheck in am. 11. Constipation: continue colace and miralax.  12. Tobacco/Marijuana use: Educate on importance of cessation.  13. COPD?: I PPD smoker PTA. Congested cough with rales--will order CXR for work up. Incentive spirometer ordered.    14. Screening for  vitamin D deficiency: check vitamin D level tomorrow.  15.  Troch bursitis Left sportscreme and PT - no c/o today   LOS: 3 days A FACE TO FACE EVALUATION WAS PERFORMED  Erick Colace 06/05/2023, 11:30 AM

## 2023-06-05 NOTE — Progress Notes (Signed)
Occupational Therapy Session Note  Patient Details  Name: Bradley Morrison MRN: [id number] Date of Birth: January 22, 1976  Today's Date: 06/05/2023 OT Individual Time: 7846-9629 session 1 OT Individual Time Calculation (min): 62 min  Session 2: 5284-1324   Short Term Goals: Week 1:  OT Short Term Goal 1 (Week 1): LTG=STG  Skilled Therapeutic Interventions/Progress Updates:  Session 1: Pt greeted supine in bed, pt agreeable to OT intervention. Session focus on BADL reeducation, functional mobility, dynamic standing balance and decreasing overall caregiver burden.   Pt completed supine>sit with supervision, pt completed sit>stand and functional mobility in room to collect clothes with RW and supervision. Pt sat on toilet to doff clothes with supervision and entered shower using grab bars with no AD and supervision. Pt completed bathing from sitting/standing  with distant supervision nearing MODI, pt using grab bars appropriately during bathing.    Pt exited shower in same manner with pt sitting on toilet to don clothes MODI. Pt exited bathroom with RW and supervision. Pt sat in chair with arm rests to don socks and shoes with set- up.   Pt completed standing grooming task at sink with distant supervision with no UE support for > 6 mins.         Pt completed ambulatory transfer back to bathroom for toileting with pt having continent urine void with distant supervision for 3/3 toileting tasks.     9 Hole Peg Test is used to measure finger dexterity in pts with various neurological diagnoses. - Instructions The pt was instructed to pick up the pegs one at a time, using their dominant hand first and put them into the holes in any order until the holes were all filled. The pt then removed the pegs one at a time and returned them to the container. Both hands were tested separately.  - Results The pt completed the test in 36 seconds (RUE) 41 secs on LUE. Scores are based on the time taken to  complete the activity. The timer started the moment the pt touched the first peg until the moment the last peg hit the container.  Ended session with pt seated in w/c with chair alarm activated and all needs within reach.     Session 2: pt greeted seated in w/c, pt agreeable to OT intervention. Total a transport to ADL apt where pt completed ambulatory transfers to flat [facility] and low couch with RW and supervision. Pt additionally completed ambulatory transfer to TTB ( this is what pt has at home) with supervision. Pt completed IADL task in kitchen where pt able to reach [place] and below knee level to place items in cabinets, pt reports that he does feel like he will be completing simple meal prep once his mom returns to work. Education provided on fall prevention for kitchen mobility as well as recommendation for RW bag to transport items.   Pt able to ambulate around apt to collect wash cloths from various surface heights to simulate IADL task of cleaning with RW and no LOB. Pt even able to stand on airex cushion to fold wash cloths with no UE support or LOB. Pt reports needing to return to room because his clothes were "wet."   Pt ambulated back to room with supervision with Rw, pt sat on shower seat to doff LB clothing d/t incontinence with supervision. Pt exited bathroom with Rw and supervision to place dirty clothes in bottom drawer of closet.   Ended session with pt seated EOB with all needs  within reach.    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

## 2023-06-05 NOTE — Progress Notes (Signed)
Speech Language Pathology Daily Session Note  Patient Details  Name: Bradley Morrison MRN: [id number] Date of Birth: 02-15-1976  Today's Date: 06/05/2023 SLP Individual Time: 0900-1000 SLP Individual Time Calculation (min): 60 min  Short Term Goals: Week 1: SLP Short Term Goal 1 (Week 1): Patient will consume least restrictive diet utilizing compensatory with no overt s/sx of aspiration with min verbal A SLP Short Term Goal 2 (Week 1): Patient will increase speech intelligibility to 90% at the conversational level utilizing compensatory strategies with min verbal A SLP Short Term Goal 3 (Week 1): Patient will demonstrate problem solving skills during mildly complex functional tasks with min multimodal A SLP Short Term Goal 4 (Week 1): Patient will recall and utilize memory compensatory strategies with mulitmodal minA  Skilled Therapeutic Interventions:   Pt was seen for skilled ST services targeting dysarthria and swallowing. Pt was agreeable to services this date. SLP reviewed use of speech intelligibility strategies provided by primary SLP re: Slow Loud Over artic Pause (SLOP) as well as WRAP memory strategies. Pt was 100% intelligible in today's conversation. SLP encouraged pt to be mindful of strategy use in certain situations re: when talking on phone to others. SLP reviewed safe swallow strategies. Pt did not recall use of chin tuck and reported, "these are all things I didn't use to have to do." Pt observed to cough 2/5x while drinking from straw. SLP explained the importance of using safe swallow strategies and wrote it on a paper for a visual reminder.   Pt was left in room, all immediate needs within reach and all safety measures activated. Continue with ST POC.   Pain Pain Assessment Pain Scale: 0-10 Pain Score: 0-No pain  Therapy/Group: Individual Therapy  Dorena Bodo 06/05/2023, 12:24 PM

## 2023-06-05 NOTE — Plan of Care (Signed)
  Problem: RH SAFETY Goal: RH STG ADHERE TO SAFETY PRECAUTIONS W/ASSISTANCE/DEVICE Description: STG Adhere to Safety Precautions With cueing Assistance/Device. Outcome: Progressing Goal: RH STG DECREASED RISK OF FALL WITH ASSISTANCE Description: STG Decreased Risk of Fall With cueing Assistance. Outcome: Progressing   Problem: RH PAIN MANAGEMENT Goal: RH STG PAIN MANAGED AT OR BELOW PT'S PAIN GOAL Description: Pain will be managed at 3 out of 10 on pain scale with PRN medications min assist  Outcome: Progressing   Problem: RH KNOWLEDGE DEFICIT Goal: RH STG INCREASE KNOWLEGDE OF HYPERLIPIDEMIA Description: Patient/caregiver will be able to manage cholesterol medications and diet modifications to improve HDL levels from nursing handouts and nursing education independently  Outcome: Progressing Goal: RH STG INCREASE KNOWLEDGE OF STROKE PROPHYLAXIS Description: Patient/caregiver will be able to manage stroke medications (ASA/Plavix) from nursing education and nursing handouts independently  Outcome: Progressing   Problem: Education: Goal: Ability to describe self-care measures that may prevent or decrease complications (Diabetes Survival Skills Education) will improve Outcome: Progressing   Problem: Coping: Goal: Ability to adjust to condition or change in health will improve Outcome: Progressing   Problem: Health Behavior/Discharge Planning: Goal: Ability to identify and utilize available resources and services will improve Outcome: Progressing

## 2023-06-06 LAB — GLUCOSE, CAPILLARY: Glucose-Capillary: 87 mg/dL (ref 70–99)

## 2023-06-06 MED ORDER — IPRATROPIUM-ALBUTEROL 20-100 MCG/ACT IN AERS: 1.0000 | INHALATION_SPRAY | Freq: Four times a day (QID) | RESPIRATORY_TRACT | Status: DC

## 2023-06-06 MED ORDER — IPRATROPIUM-ALBUTEROL 0.5-2.5 (3) MG/3ML IN SOLN
3.0000 mL | Freq: Four times a day (QID) | RESPIRATORY_TRACT | Status: DC
  Administered 2023-06-06 – 2023-06-07 (×4): 3 mL via RESPIRATORY_TRACT
  Filled 2023-06-06 (×6): qty 3

## 2023-06-06 NOTE — Progress Notes (Signed)
PRN tylenol given at 1952 & 0259 for C/O HA. Refused scheduled colace. Congested cough, productive per patient with white thick phlegm. Demonstrated I.S. use. Bradley Morrison A

## 2023-06-06 NOTE — Plan of Care (Signed)
  Problem: Consults Goal: RH STROKE PATIENT EDUCATION Description: See Patient Education module for education specifics  Outcome: Progressing   Problem: RH BOWEL ELIMINATION Goal: RH STG MANAGE BOWEL WITH ASSISTANCE Description: STG Manage Bowel with min Assistance. Outcome: Progressing Goal: RH STG MANAGE BOWEL W/MEDICATION W/ASSISTANCE Description: STG Manage Bowel with Medication with min Assistance. Outcome: Progressing   Problem: RH BLADDER ELIMINATION Goal: RH STG MANAGE BLADDER WITH ASSISTANCE Description: STG Manage Bladder With min Assistance Outcome: Progressing   Problem: RH SAFETY Goal: RH STG ADHERE TO SAFETY PRECAUTIONS W/ASSISTANCE/DEVICE Description: STG Adhere to Safety Precautions With cueing Assistance/Device. Outcome: Progressing Goal: RH STG DECREASED RISK OF FALL WITH ASSISTANCE Description: STG Decreased Risk of Fall With cueing Assistance. Outcome: Progressing   Problem: RH PAIN MANAGEMENT Goal: RH STG PAIN MANAGED AT OR BELOW PT'S PAIN GOAL Description: Pain will be managed at 3 out of 10 on pain scale with PRN medications min assist  Outcome: Progressing   Problem: RH KNOWLEDGE DEFICIT Goal: RH STG INCREASE KNOWLEGDE OF HYPERLIPIDEMIA Description: Patient/caregiver will be able to manage cholesterol medications and diet modifications to improve HDL levels from nursing handouts and nursing education independently  Outcome: Progressing Goal: RH STG INCREASE KNOWLEDGE OF STROKE PROPHYLAXIS Description: Patient/caregiver will be able to manage stroke medications (ASA/Plavix) from nursing education and nursing handouts independently  Outcome: Progressing   Problem: Education: Goal: Ability to describe self-care measures that may prevent or decrease complications (Diabetes Survival Skills Education) will improve Outcome: Progressing Goal: Individualized Educational Video(s) Outcome: Progressing   Problem: Coping: Goal: Ability to adjust to condition  or change in health will improve Outcome: Progressing   Problem: Fluid Volume: Goal: Ability to maintain a balanced intake and output will improve Outcome: Progressing   Problem: Health Behavior/Discharge Planning: Goal: Ability to identify and utilize available resources and services will improve Outcome: Progressing Goal: Ability to manage health-related needs will improve Outcome: Progressing   Problem: Metabolic: Goal: Ability to maintain appropriate glucose levels will improve Outcome: Progressing   Problem: Nutritional: Goal: Maintenance of adequate nutrition will improve Outcome: Progressing Goal: Progress toward achieving an optimal weight will improve Outcome: Progressing   Problem: Skin Integrity: Goal: Risk for impaired skin integrity will decrease Outcome: Progressing   Problem: Tissue Perfusion: Goal: Adequacy of tissue perfusion will improve Outcome: Progressing

## 2023-06-06 NOTE — Progress Notes (Signed)
PROGRESS NOTE   Subjective/Complaints:  Has not been sleeping well , also at home , intermittent cough   ROS neg CP SOB, N/V/D  Objective:   No results found. No results for input(s): "WBC", "HGB", "HCT", "PLT" in the last 72 hours.  No results for input(s): "NA", "K", "CL", "CO2", "GLUCOSE", "BUN", "CREATININE", "CALCIUM" in the last 72 hours.   Intake/Output Summary (Last 24 hours) at 06/06/2023 1027 Last data filed at 06/06/2023 0830 Gross per 24 hour  Intake 1074 ml  Output 1600 ml  Net -526 ml        Physical Exam: Vital Signs Blood pressure (!) 105/55, pulse (!) 57, temperature 97.7 F (36.5 C), temperature source Oral, resp. rate 18, height 5\' 6"  (1.676 m), weight 90.9 kg, SpO2 92%.  General: No acute distress Mood and affect are appropriate Heart: Regular rate and rhythm no rubs murmurs or extra sounds Lungs: Clear to auscultation, breathing unlabored, no rales or wheezes Abdomen: Positive bowel sounds, soft nontender to palpation, nondistended Extremities: No clubbing, cyanosis, or edema Skin: No evidence of breakdown, no evidence of rash Neurologic: Cranial nerves II through XII intact, motor strength is 5/5 in bilateral deltoid, bicep, tricep, grip, 5/5 RIght and 3- left hip flexor, knee extensors, ankle dorsiflexor and plantar flexor Sensory exam normal sensation to light touch and proprioception in bilateral upper and lower extremities  Musculoskeletal: Full range of motion in all 4 extremities. No joint swelling ,   Assessment/Plan: 1. Functional deficits which require 3+ hours per day of interdisciplinary therapy in a comprehensive inpatient rehab setting. Physiatrist is providing close team supervision and 24 hour management of active medical problems listed below. Physiatrist and rehab team continue to assess barriers to discharge/monitor patient progress toward functional and medical goals  Care  Tool:  Bathing    Body parts bathed by patient: Right arm, Left arm, Chest, Abdomen, Front perineal area, Buttocks, Right upper leg, Left upper leg, Right lower leg, Left lower leg, Face         Bathing assist Assist Level: Supervision/Verbal cueing     Upper Body Dressing/Undressing Upper body dressing   What is the patient wearing?: Pull over shirt    Upper body assist Assist Level: Set up assist    Lower Body Dressing/Undressing Lower body dressing      What is the patient wearing?: Underwear/pull up, Pants     Lower body assist Assist for lower body dressing: Supervision/Verbal cueing     Toileting Toileting    Toileting assist Assist for toileting: Contact Guard/Touching assist     Transfers Chair/bed transfer  Transfers assist     Chair/bed transfer assist level: Supervision/Verbal cueing     Locomotion Ambulation   Ambulation assist      Assist level: Contact Guard/Touching assist Assistive device: Walker-rolling Max distance: 90   Walk 10 feet activity   Assist     Assist level: Minimal Assistance - Patient > 75% Assistive device: Walker-rolling   Walk 50 feet activity   Assist    Assist level: Minimal Assistance - Patient > 75% Assistive device: Walker-rolling    Walk 150 feet activity   Assist Walk 150 feet activity  did not occur: Safety/medical concerns         Walk 10 feet on uneven surface  activity   Assist     Assist level: Minimal Assistance - Patient > 75% Assistive device: Walker-rolling   Wheelchair     Assist Is the patient using a wheelchair?: Yes Type of Wheelchair: Manual    Wheelchair assist level: Supervision/Verbal cueing Max wheelchair distance: 150+    Wheelchair 50 feet with 2 turns activity    Assist        Assist Level: Supervision/Verbal cueing   Wheelchair 150 feet activity     Assist      Assist Level: Supervision/Verbal cueing   Blood pressure (!) 105/55,  pulse (!) 57, temperature 97.7 F (36.5 C), temperature source Oral, resp. rate 18, height 5\' 6"  (1.676 m), weight 90.9 kg, SpO2 92%.  Medical Problem List and Plan: 1. 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              -patient may shower             -ELOS/Goals: 10-12 days S             Admit to CIR 2.  Antithrombotics: -DVT/anticoagulation:  Pharmaceutical: Lovenox             -antiplatelet therapy: DAPT X 3 weeks followed by ASA alone.  3. Diabetic neuropathy/Pain Management:  Continue gabapentin tid 4. Mood/Behavior/Sleep:  LCSW to follow for evaluation and support             --trazodone prn.             -antipsychotic agents: N/A 5. Neuropsych/cognition: This patient is not capable of making decisions on his own behalf. 6. Skin/Wound Care: Routine pressure relief measures.  7. Fluids/Electrolytes/Nutrition: Monitor I/O- check CMET in am. 8. HTN: Monitor BP TID- continue to hold amlodipine and hyzaar            Vitals:   06/05/23 1931 06/06/23 0459  BP: (!) 140/80 (!) 105/55  Pulse: 71 (!) 57  Resp: 18 18  Temp: 99.6 F (37.6 C) 97.7 F (36.5 C)  SpO2: 98% 92%  Good range thus far 8/18  -will add magnesium gluconate 250mg  HS. 9. T2DM: Better control with Hgb A1C-8.5( 02/24)-->6.5 @ admission. Monitor BS ac/hs             --SSI for elevated BS             --On farxiga daily CBG (last 3)  Recent Labs    06/04/23 1213 06/05/23 0612 06/06/23 0613  GLUCAP 84 93 87  Controlled 8/18, daily CBGs 10. Intermittent hypokalemia: Recheck in am. 11. Constipation: continue colace and miralax.  12. Tobacco/Marijuana use: Educate on importance of cessation.  13. COPD?: I PPD smoker PTA. Starting combivent 8/18   14. Screening for vitamin D deficiency: check vitamin D level tomorrow.  15.  Troch bursitis Left sportscreme and PT - no c/o today   LOS: 4 days A FACE TO FACE EVALUATION WAS PERFORMED  Erick Colace 06/06/2023, 10:27 AM

## 2023-06-07 DIAGNOSIS — I10 Essential (primary) hypertension: Secondary | ICD-10-CM

## 2023-06-07 DIAGNOSIS — G479 Sleep disorder, unspecified: Secondary | ICD-10-CM

## 2023-06-07 LAB — BASIC METABOLIC PANEL
Anion gap: 8 (ref 5–15)
BUN: 12 mg/dL (ref 6–20)
CO2: 24 mmol/L (ref 22–32)
Calcium: 8.8 mg/dL — ABNORMAL LOW (ref 8.9–10.3)
Chloride: 104 mmol/L (ref 98–111)
Creatinine, Ser: 0.95 mg/dL (ref 0.61–1.24)
GFR, Estimated: >60 mL/min (ref >60)
Glucose, Bld: 136 mg/dL — ABNORMAL HIGH (ref 70–99)
Potassium: 3.6 mmol/L (ref 3.5–5.1)
Sodium: 136 mmol/L (ref 135–145)

## 2023-06-07 LAB — FACTOR 5 LEIDEN

## 2023-06-07 LAB — CBC
HCT: 43 % (ref 39.0–52.0)
Hemoglobin: 13.8 g/dL (ref 13.0–17.0)
MCH: 28.5 pg (ref 26.0–34.0)
MCHC: 32.1 g/dL (ref 30.0–36.0)
MCV: 88.8 fL (ref 80.0–100.0)
Platelets: 370 10*3/uL (ref 150–400)
RBC: 4.84 MIL/uL (ref 4.22–5.81)
RDW: 13.3 % (ref 11.5–15.5)
WBC: 10.5 10*3/uL (ref 4.0–10.5)
nRBC: 0 % (ref 0.0–0.2)

## 2023-06-07 LAB — PROTHROMBIN GENE MUTATION

## 2023-06-07 LAB — GLUCOSE, CAPILLARY: Glucose-Capillary: 144 mg/dL — ABNORMAL HIGH (ref 70–99)

## 2023-06-07 MED ORDER — IPRATROPIUM-ALBUTEROL 0.5-2.5 (3) MG/3ML IN SOLN: 3.0000 mL | RESPIRATORY_TRACT | Status: DC | PRN

## 2023-06-07 NOTE — Progress Notes (Signed)
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 Direct handoff from PT.  Pain: no pain reported  ADL: Grooming: brushing hair SBA seated in W/C at sink Oral hygiene: SBA seated in W/C brushing teeth, , able to manipulate containers  UB dressing: SBA seated EOB for donning/sodding LB dressing: SBA seated and standing EOB for donning/doffing pants and underwear Footwear: SBA seated in W/C for donning/doffing socks and shoes Shower transfer: SBA ambulating with RW from W/C><shower Bathing: SBA seated on TTB for washing/drying all body parts Transfers: SBA overall for all transfers with RW  Exercises: Pt completed 6 minutes of nu step bike in order to increase BUE/BLEstrength and endurance in preparation for increased independence in ADLs such as bathing. Rest break after 3 min d/t muscle spasm in Lt leg, on level 10 resistance initially, turned down to level 8 after 3 min.  Pt seated in W/C at end of session with W/C alarm donned, call light within reach and 4Ps assessed.    Therapy/Group: Individual Therapy  Velia Meyer, OTD, OTR/L 06/07/2023, 12:34 PM

## 2023-06-07 NOTE — Progress Notes (Signed)
PROGRESS NOTE   Subjective/Complaints:  Intermittent sleeping issues. Doesn't like being woken for VS overnight.   ROS: Patient denies fever, rash, sore throat, blurred vision, dizziness, nausea, vomiting, diarrhea, cough, shortness of breath or chest pain, joint or back/neck pain, headache, or mood change.   Objective:   No results found. Recent Labs    06/07/23 0612  WBC 10.5  HGB 13.8  HCT 43.0  PLT 370    Recent Labs    06/07/23 0612  NA 136  K 3.6  CL 104  CO2 24  GLUCOSE 136*  BUN 12  CREATININE 0.95  CALCIUM 8.8*     Intake/Output Summary (Last 24 hours) at 06/07/2023 1033 Last data filed at 06/07/2023 0600 Gross per 24 hour  Intake 600 ml  Output 2100 ml  Net -1500 ml        Physical Exam: Vital Signs Blood pressure 125/78, pulse 71, temperature 98.4 F (36.9 C), temperature source Oral, resp. rate 18, height 5\' 6"  (1.676 m), weight 90.9 kg, SpO2 94%.  Constitutional: No distress . Vital signs reviewed. HEENT: NCAT, EOMI, oral membranes moist Neck: supple Cardiovascular: RRR without murmur. No JVD    Respiratory/Chest: CTA Bilaterally without wheezes or rales. Normal effort    GI/Abdomen: BS +, non-tender, non-distended Ext: no clubbing, cyanosis, or edema Psych: pleasant and cooperative  Skin: No evidence of breakdown, no evidence of rash Neurologic: Cranial nerves II through XII intact except for left facial weakness--speech slightly slurred, motor strength is 5/5 in bilateral deltoid, bicep, tricep, grip, 5/5 RIght and 3- left hip flexor, knee extensors, ankle dorsiflexor and plantar flexor. Good sitting balance. Sensory exam normal sensation to light touch and proprioception in bilateral upper and lower extremities  Musculoskeletal: Full range of motion in all 4 extremities. No joint swelling ,   Assessment/Plan: 1. Functional deficits which require 3+ hours per day of  interdisciplinary therapy in a comprehensive inpatient rehab setting. Physiatrist is providing close team supervision and 24 hour management of active medical problems listed below. Physiatrist and rehab team continue to assess barriers to discharge/monitor patient progress toward functional and medical goals  Care Tool:  Bathing    Body parts bathed by patient: Right arm, Left arm, Chest, Abdomen, Front perineal area, Buttocks, Right upper leg, Left upper leg, Right lower leg, Left lower leg, Face         Bathing assist Assist Level: Supervision/Verbal cueing     Upper Body Dressing/Undressing Upper body dressing   What is the patient wearing?: Pull over shirt    Upper body assist Assist Level: Set up assist    Lower Body Dressing/Undressing Lower body dressing      What is the patient wearing?: Underwear/pull up, Pants     Lower body assist Assist for lower body dressing: Supervision/Verbal cueing     Toileting Toileting    Toileting assist Assist for toileting: Contact Guard/Touching assist     Transfers Chair/bed transfer  Transfers assist     Chair/bed transfer assist level: Supervision/Verbal cueing     Locomotion Ambulation   Ambulation assist      Assist level: Contact Guard/Touching assist Assistive device: Walker-rolling Max distance: 90  Walk 10 feet activity   Assist     Assist level: Minimal Assistance - Patient > 75% Assistive device: Walker-rolling   Walk 50 feet activity   Assist    Assist level: Minimal Assistance - Patient > 75% Assistive device: Walker-rolling    Walk 150 feet activity   Assist Walk 150 feet activity did not occur: Safety/medical concerns         Walk 10 feet on uneven surface  activity   Assist     Assist level: Minimal Assistance - Patient > 75% Assistive device: Walker-rolling   Wheelchair     Assist Is the patient using a wheelchair?: Yes Type of Wheelchair: Manual     Wheelchair assist level: Supervision/Verbal cueing Max wheelchair distance: 150+    Wheelchair 50 feet with 2 turns activity    Assist        Assist Level: Supervision/Verbal cueing   Wheelchair 150 feet activity     Assist      Assist Level: Supervision/Verbal cueing   Blood pressure 125/78, pulse 71, temperature 98.4 F (36.9 C), temperature source Oral, resp. rate 18, height 5\' 6"  (1.676 m), weight 90.9 kg, SpO2 94%.  Medical Problem List and Plan: 1. 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              -patient may shower             -ELOS/Goals: 10-12 days S             -Continue CIR therapies including PT, OT, and SLP  2.  Antithrombotics: -DVT/anticoagulation:  Pharmaceutical: Lovenox             -antiplatelet therapy: DAPT X 3 weeks followed by ASA alone.  3. Diabetic neuropathy/Pain Management:  Continue gabapentin tid 4. Mood/Behavior/Sleep:  LCSW to follow for evaluation and support             --trazodone prn. Pt would like to keep it PRN  -will limit interruptions overnight             -antipsychotic agents: N/A 5. Neuropsych/cognition: This patient is not capable of making decisions on his own behalf. 6. Skin/Wound Care: Routine pressure relief measures.  7. Fluids/Electrolytes/Nutrition: Monitor I/O  -Encourage PO  -I personally reviewed the patient's labs today.   8. HTN: Monitor BP TID- continue to hold amlodipine and hyzaar            Vitals:   06/07/23 0553 06/07/23 0850  BP: 125/78   Pulse: 71   Resp: 18 18  Temp: 98.4 F (36.9 C)   SpO2: 94%   Reasonable control thus far 8/19  -will add magnesium gluconate 250mg  HS. 9. T2DM: Better control with Hgb A1C-8.5( 02/24)-->6.5 @ admission. Monitor BS ac/hs             --SSI for elevated BS             --On farxiga daily CBG (last 3)  Recent Labs    06/05/23 0612 06/06/23 0613 06/07/23 0554  GLUCAP 93 87 144*  Controlled 8/19, daily CBGs 10.  Intermittent hypokalemia: Recheck in am. 11. Constipation: continue colace and miralax.  12. Tobacco/Marijuana use: Educate on importance of cessation.  13. COPD?: I PPD smoker PTA. Started combivent 8/19   14. Screening for vitamin D deficiency: check vitamin D level tomorrow.  15.  Troch bursitis Left sportscreme and PT - no c/o today   LOS: 5 days A FACE  TO FACE EVALUATION WAS PERFORMED  Ranelle Oyster 06/07/2023, 10:33 AM

## 2023-06-07 NOTE — Plan of Care (Signed)
  Problem: Consults Goal: RH STROKE PATIENT EDUCATION Description: See Patient Education module for education specifics  Outcome: Progressing   Problem: RH BOWEL ELIMINATION Goal: RH STG MANAGE BOWEL WITH ASSISTANCE Description: STG Manage Bowel with min Assistance. Outcome: Progressing Goal: RH STG MANAGE BOWEL W/MEDICATION W/ASSISTANCE Description: STG Manage Bowel with Medication with min Assistance. Outcome: Progressing   Problem: RH BLADDER ELIMINATION Goal: RH STG MANAGE BLADDER WITH ASSISTANCE Description: STG Manage Bladder With min Assistance Outcome: Progressing   Problem: RH SAFETY Goal: RH STG ADHERE TO SAFETY PRECAUTIONS W/ASSISTANCE/DEVICE Description: STG Adhere to Safety Precautions With cueing Assistance/Device. Outcome: Progressing Goal: RH STG DECREASED RISK OF FALL WITH ASSISTANCE Description: STG Decreased Risk of Fall With cueing Assistance. Outcome: Progressing   Problem: RH PAIN MANAGEMENT Goal: RH STG PAIN MANAGED AT OR BELOW PT'S PAIN GOAL Description: Pain will be managed at 3 out of 10 on pain scale with PRN medications min assist  Outcome: Progressing   Problem: RH KNOWLEDGE DEFICIT Goal: RH STG INCREASE KNOWLEGDE OF HYPERLIPIDEMIA Description: Patient/caregiver will be able to manage cholesterol medications and diet modifications to improve HDL levels from nursing handouts and nursing education independently  Outcome: Progressing Goal: RH STG INCREASE KNOWLEDGE OF STROKE PROPHYLAXIS Description: Patient/caregiver will be able to manage stroke medications (ASA/Plavix) from nursing education and nursing handouts independently  Outcome: Progressing   Problem: Education: Goal: Ability to describe self-care measures that may prevent or decrease complications (Diabetes Survival Skills Education) will improve Outcome: Progressing Goal: Individualized Educational Video(s) Outcome: Progressing   Problem: Coping: Goal: Ability to adjust to condition  or change in health will improve Outcome: Progressing   Problem: Fluid Volume: Goal: Ability to maintain a balanced intake and output will improve Outcome: Progressing   Problem: Health Behavior/Discharge Planning: Goal: Ability to identify and utilize available resources and services will improve Outcome: Progressing Goal: Ability to manage health-related needs will improve Outcome: Progressing   Problem: Metabolic: Goal: Ability to maintain appropriate glucose levels will improve Outcome: Progressing   Problem: Nutritional: Goal: Maintenance of adequate nutrition will improve Outcome: Progressing Goal: Progress toward achieving an optimal weight will improve Outcome: Progressing   Problem: Skin Integrity: Goal: Risk for impaired skin integrity will decrease Outcome: Progressing   Problem: Tissue Perfusion: Goal: Adequacy of tissue perfusion will improve Outcome: Progressing

## 2023-06-07 NOTE — Progress Notes (Signed)
Speech Language Pathology Daily Session Note  Patient Details  Name: PATTY THAM MRN: [id number] Date of Birth: Jan 19, 1976  Today's Date: 06/07/2023 SLP Individual Time: 1300-1330 SLP Individual Time Calculation (min): 30 min  Short Term Goals: Week 1: SLP Short Term Goal 1 (Week 1): Patient will consume least restrictive diet utilizing compensatory with no overt s/sx of aspiration with min verbal A SLP Short Term Goal 2 (Week 1): Patient will increase speech intelligibility to 90% at the conversational level utilizing compensatory strategies with min verbal A SLP Short Term Goal 3 (Week 1): Patient will demonstrate problem solving skills during mildly complex functional tasks with min multimodal A SLP Short Term Goal 4 (Week 1): Patient will recall and utilize memory compensatory strategies with mulitmodal minA  Skilled Therapeutic Interventions: SLP conducted skilled therapy session targeting dysphagia management and cognitive retraining. Patient reports that he is "doing better" with swallowing, speech, and memory skills. SLP entered upon completion of lunch tray with patient reporting no difficulty across his meal. SLP assessed diet tolerance and utilization of swallow strategies with thin liquids. Patient reports that he utilize chin tuck strategy "most of the time" however endorses no events of liquids "going down the wrong way" if he neglects to use this strategy. SLP reeducated patient on the importance of using this strategy consistently 2* variably silent occurrences of aspiration on most recent MBSS. Patient verbalized understanding. This session, patient's speech was 100% intelligible across all utterance lengths. Patient recalled 1/4 SLOP speech intelligibility strategies independently however recalled remaining strategies with modified independence (use of handout). Patient required minA to utilize same handout to recall WRAP memory strategies. SLP and patient completed complex  medication management task with patient identifying medication errors with minA. At the end of the session, patient utilized bedside urinal independently where he was continent of bladder. Patient was left in chair with call bell in reach and chair alarm set. SLP will continue to target goals per plan of care.       Pain Pain Assessment Pain Scale: 0-10 Pain Score: 0-No pain  Therapy/Group: Individual Therapy  Jeannie Done, M.A., CCC-SLP  Yetta Barre 06/07/2023, 1:30 PM

## 2023-06-07 NOTE — Progress Notes (Signed)
PRN tylenol given at 1926 & 0554, for complaint of HA.  Requested PRN trazodone, 50mg 's given at 2115. Slept good, wanting to take trazodone again tonight. Alfredo Martinez A

## 2023-06-07 NOTE — Progress Notes (Signed)
Physical Therapy Session Note  Patient Details  Name: Bradley Morrison MRN: [id number] Date of Birth: 02-09-1976  Today's Date: 06/07/2023 PT Individual Time: 0920-1002 PT Individual Time Calculation (min): 42 min   Short Term Goals: Week 1:  PT Short Term Goal 1 (Week 1): STG=LTGs 2/2 ELOS  Skilled Therapeutic Interventions/Progress Updates:  Patient seated upright in w/c on entrance to room. Patient alert and agreeable to PT session. However even though he slept better last night, he still has complaints of being woken up throughout the night by staff. He would prefer to sleep and get his rest.    Patient with no pain complaint at start of session.  Therapeutic Activity: Transfers: Pt performed sit<>stand transfers throughout session with supervision. Stand pivot transfers with close supervision/ CGA. Provided verbal cues for effort with equal BLE push into extension and upright posture.   Pt navigates to day room propelling w/c with supervision.   Neuromuscular Re-ed: NMR facilitated during session with focus on standing balance, L quad activation and motor control. Pt guided in minisquat progression for improved motor control of concentric/ eccentric quad activation. Guided in: 5 minisquats with BLE on level floor with significant vc for reaching full upright posture with head and shoulders balanced over feet.  5 focused minisquats performed on Airex pad under RLE with increased need for vc/ tc for full upright posturing as pt unbalanced with bias to stance.  5reps x 2 sets focused minisquats performed with RLE on  6" step with increasing difficulty and fatigue but continues to perform with close guard to L knee. Guided in conscious practice prior to performance with each rep. Pt finds reaching full upright stance difficult d/t increased height of bias.   NMR performed for improvements in motor control and coordination, balance, sequencing, judgement, and self confidence/ efficacy in  performing all aspects of mobility at highest level of independence.   Gait Training:  Pt ambulated 100 ft back to room using RW with CGA. Demonstrated improving control to L knee and prevention of uncontrolled hyperextension into genu recurvatem. Only few instances of hyperextension throughout short bout. Provided vc/ tc for maintaining control of R knee in stance phase.  Patient seated upright in w/c at end of session with brakes locked, seat pad alarm set, and all needs within reach.   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

## 2023-06-07 NOTE — Progress Notes (Signed)
Occupational Therapy Session Note  Patient Details  Name: Bradley Morrison MRN: [id number] Date of Birth: 08-Dec-1975  Today's Date: 06/07/2023 OT Individual Time: 9562-1308 OT Individual Time Calculation (min): 58 min    Short Term Goals: Week 1:  OT Short Term Goal 1 (Week 1): LTG=STG  Skilled Therapeutic Interventions/Progress Updates:  Pt greeted seated in w/c, pt agreeable to OT intervention. Pt transported outside with total A for time mgmt. Pt completed functional ambulation outside with Rw and CGA across uneven surfaces to simulate community level mobility. Pt able to transfer to park bench and ambulate in gift shop with an emphasis on dynamic reaching in community setting as pt given 3 items to reach for with all items being below knee level or OH, pt completed task with supervision. Education provided on energy conservation strategies for community mobility such as using Rw bag, using motorized cart as needed,and being aware of closest seating available.   Discussed using handicap plakard for community mobility as well as going out to familiar places at first while pt is rebuilding confidence for community mobility. Pt transported back to room with total A for time mgmt. Pt completed ambulatory toilet transfer MODI with Rw and completed 3/3 toileting tasks MODI with continent urine void.   Ended session with pt seated EOB with all needs within reach.     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

## 2023-06-08 LAB — GLUCOSE, CAPILLARY: Glucose-Capillary: 162 mg/dL — ABNORMAL HIGH (ref 70–99)

## 2023-06-08 MED ORDER — IPRATROPIUM-ALBUTEROL 0.5-2.5 (3) MG/3ML IN SOLN
3.0000 mL | Freq: Four times a day (QID) | RESPIRATORY_TRACT | Status: DC
  Administered 2023-06-08 – 2023-06-10 (×8): 3 mL via RESPIRATORY_TRACT
  Filled 2023-06-08 (×9): qty 3

## 2023-06-08 NOTE — Progress Notes (Signed)
Occupational Therapy Session Note  Patient Details  Name: Bradley Morrison MRN: [id number] Date of Birth: Jan 05, 1976  Today's Date: 06/08/2023 OT Individual Time: 1325-1429 OT Individual Time Calculation (min): 64 min    Short Term Goals: Week 1:  OT Short Term Goal 1 (Week 1): LTG=STG  Skilled Therapeutic Interventions/Progress Updates:  Pt greeted seated in w/c, pt agreeable to OT intervention. Pt completed functional mobility into bathroom with Rw MODI, pt with continent urine void completing 3/3 toileting tasks MODI. Pt completed hand hygiene at sink MODI. Pt completed functional ambulation to gym with supervision and w/c follow. Session focused on various therapeutic activities focused on dynamic standing balance on compliant/non compliant surfaces, BLE strength/endurance, functional reaching, BUE FMC and BUE strength/coordination. Pt completed all tasks with CGA with no LOB, pt even able to step up onto 2 inch step with no UE support but did require BUE support when stepping backwards off step.   Pt completed functional ambulation back to room and reported urgent bladder void, pt unable to make it to toilet in time needing fresh set of LB clothes, but pt able to don all LB clothing with only set- up assist. Ended session with pt seated in w/c and handed off to SLP.  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

## 2023-06-08 NOTE — Progress Notes (Signed)
Speech Language Pathology Daily Session Note  Patient Details  Name: Bradley Morrison MRN: [id number] Date of Birth: 21-Aug-1976  Today's Date: 06/08/2023 SLP Individual Time: 1430-1530 SLP Individual Time Calculation (min): 60 min  Short Term Goals: Week 1: SLP Short Term Goal 1 (Week 1): Patient will consume least restrictive diet utilizing compensatory with no overt s/sx of aspiration with min verbal A SLP Short Term Goal 2 (Week 1): Patient will increase speech intelligibility to 90% at the conversational level utilizing compensatory strategies with min verbal A SLP Short Term Goal 3 (Week 1): Patient will demonstrate problem solving skills during mildly complex functional tasks with min multimodal A SLP Short Term Goal 4 (Week 1): Patient will recall and utilize memory compensatory strategies with mulitmodal minA  Skilled Therapeutic Interventions: Skilled therapy session focused on cognitive goals. SLP facilitated session by providing minA during functional mathematical calculation tasks. Patient was encouraged to create list of home bills and complete budgeting task. SLP provided verbal min A during addition/subtraction of functional phone bill and check balancing activity. Patient able to recall swallowing goals independently, however continues to require minA to recall memory strategies. Patient left in bed with call bell in reach and alarm set. Continue POC.  Pain No Pain Reported   Therapy/Group: Individual Therapy  Rodneshia Greenhouse M.A., CF-SLP 06/08/2023, 4:04 PM

## 2023-06-08 NOTE — Progress Notes (Signed)
Physical Therapy Session Note  Patient Details  Name: Bradley Morrison MRN: [id number] Date of Birth: 1976-06-18  Today's Date: 06/07/2023 PT Individual Time:  1031-1102  PT Individual Time Calculation (min): 31 min   Short Term Goals: Week 1:  PT Short Term Goal 1 (Week 1): STG=LTGs 2/2 ELOS  Skilled Therapeutic Interventions/Progress Updates:  Patient seated upright in w/c on entrance to room. Patient alert and agreeable to PT session.   Patient with no pain complaint at start of session.  Therapeutic Activity: Transfers: Pt performed sit<>stand transfers with close supervision/ CGA and stand pivot transfers throughout session with CGA. Provided verbal cues for increasing hip/ knee flexion for improved foot clearance with LLE.  Gait Training/ NMR:  Pt ambulated >165 ft using RW with focus on soft hold of L knee during stance phase with increased hip/ knee flexion and heel strike. Demonstrated continuing decreased uncontrolled hyperextension of L knee into genu recurvatem. Provided vc/ tc for maintaining conscious focus to L knee throughout amb bout  Pt guided to stairs and takes seated rest break prior to initiating stairs. Pt relates understanding of using stronger LE to ascend and weaker to descend. Pt informed that he is correct but that this session we will focus on ascending with LLE. Demos excitement. Ascends/ descends eight 6" steps x2 with seated rest break between. Uses BHR and leads in step-to focus with LLE to ascend, then leading with RLE to descend for focus on controlled eccentric use of L quad. Turns and repeats with one HR.   Following seated rest break, attempts with no HR use and is able to complete ascent with CGA/ MinA provided for safety in attempt. Is able to complete 2 steps to descend with no HR but then requires HR use to complete d/t fatigue. Final completion of 4 steps with light HR use and increased focus to LLE but requiring increased BUE pressure into HR for  safety d/t fatigue.   Is able to ambulate 150 ft back toward room but then requires seated rest d/t fatigue and increased tremor in LLE quad, HS and calf.   NMR performed for improvements in motor control and coordination, balance, sequencing, judgement, and self confidence/ efficacy in performing all aspects of mobility at highest level of independence.   Patient seated upright at end of session with brakes locked, no alarm set as PT hands off to OT, and all needs within reach.  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

## 2023-06-08 NOTE — Progress Notes (Signed)
Physical Therapy Session Note  Patient Details  Name: Bradley Morrison MRN: [id number] Date of Birth: [birth year]  Today's Date: 06/08/2023 PT Individual Time: 1033-1105 PT Individual Time Calculation (min): 32 min   Short Term Goals: Week 1:  PT Short Term Goal 1 (Week 1): STG=LTGs 2/2 ELOS  Skilled Therapeutic Interventions/Progress Updates:  Patient seated upright in w/c on entrance to room. Relates that he has showered with NT [name] providing supervision. Patient alert and agreeable to PT session.   Patient with no pain complaint at start of session.  Therapeutic Activity: Transfers: ambulatory transfer to bathroom for continent urination void in toilet with distant supervision. Flushes, manages clothing, walker and door independently. Washes hands at sink while standing with distant supervision.   Pt performed sit<>stand and stand pivot transfers throughout session with supervision. Provided min vc for foot placement and forward lean with no UE use.   Neuromuscular Re-ed: Amb to day room using RW with no noted hyperextension of LLE.  NMR facilitated during session with focus on standing balance, increasing LLE motor control and mm activation. Pt guided in sit<>stand performance with effective use of BLE only with improved weight shift. Used rolling stool in gym with instructions to pt not to move stool during descent to sit or rise to stand. Demonstrated to pt required biomechanics for appropriate performance. Pt with hands on knees and is able to control descent to seat with minimal movement of seat and overall supervision. Attains balance more quickly in attempt to rise with improved position of feet more posteriorly and increased forward lean. But does continue to require quicker move into forward flexion to drive weight shift with increased momentum. Minimal rolling stool movement. Performed x2.  NMR performed for improvements in motor control and coordination, balance, sequencing,  judgement, and self confidence/ efficacy in performing all aspects of mobility at highest level of independence.   Gait Training:  Pt ambulated across day room with no AD. Covers ~65 ft with CGA. Demonstrated need for increased focus to LLE performance and maintaining increased step height. Although demos brief toe touches, no LOB. Intermittent step outs required for increased lateral weight shift over RLE, but no LOB. Provided vc/ tc for technique as needed. Return trip similar but provided pt with tension to gait belt positioned over Bil ASIS of pelvis to challenge motor control of L quad with one instance of uncontrolled hyperextension.   Return ambulation to room with no AD and intermittent scuffs of L foot d/t fatigue. Provided with continued vc for improved foot clearance throughout.   Patient seated upright in w/c at end of session with brakes locked, seat pad alarm set, and all needs within reach.  Therapy Documentation Precautions:  Precautions Precautions: Fall Precaution Comments: old residual left hemiparesis Restrictions Weight Bearing Restrictions: No  Pain:  No pain related this session.   Therapy/Group: Individual Therapy  Loel Dubonnet PT, DPT, CSRS 06/08/2023, 12:19 PM

## 2023-06-08 NOTE — Plan of Care (Signed)
Increased complexity of goal due to progress   Problem: RH Expression Communication Goal: LTG Patient will increase speech intelligibility (SLP) Description: LTG: Patient will increase speech intelligibility at word/phrase/conversation level with cues, % of the time (SLP) Flowsheets Taken 06/08/2023 1602 LTG: Patient will increase speech intelligibility (SLP): Modified Independent Level: Conversation level Taken 06/03/2023 1248 Percent of time patient will use intelligible speech: 90

## 2023-06-08 NOTE — Progress Notes (Addendum)
PROGRESS NOTE   Subjective/Complaints:  No issues overnite , dad needs FMLA papers   ROS: Patient denies CP, SOB, N/V/D  Objective:   No results found. Recent Labs    06/07/23 0612  WBC 10.5  HGB 13.8  HCT 43.0  PLT 370    Recent Labs    06/07/23 0612  NA 136  K 3.6  CL 104  CO2 24  GLUCOSE 136*  BUN 12  CREATININE 0.95  CALCIUM 8.8*     Intake/Output Summary (Last 24 hours) at 06/08/2023 0805 Last data filed at 06/08/2023 0645 Gross per 24 hour  Intake 712 ml  Output 1225 ml  Net -513 ml        Physical Exam: Vital Signs Blood pressure 127/78, pulse 71, temperature 98.2 F (36.8 C), temperature source Oral, resp. rate 18, height 5\' 6"  (1.676 m), weight 90.9 kg, SpO2 93%.   General: No acute distress Mood and affect are appropriate Heart: Regular rate and rhythm no rubs murmurs or extra sounds Lungs: Clear to auscultation, breathing unlabored, no rales or wheezes Abdomen: Positive bowel sounds, soft nontender to palpation, nondistended Extremities: No clubbing, cyanosis, or edema  Skin: No evidence of breakdown, no evidence of rash Neurologic: Cranial nerves II through XII intact except for left facial weakness--speech slightly slurred, motor strength is 5/5 in bilateral deltoid, bicep, tricep, grip, 5/5 RIght and 3- left hip flexor, knee extensors, ankle dorsiflexor and plantar flexor. Good sitting balance. Sensory exam normal sensation to light touch and proprioception in bilateral upper and lower extremities  Musculoskeletal: Full range of motion in all 4 extremities. No joint swelling ,   Assessment/Plan: 1. Functional deficits which require 3+ hours per day of interdisciplinary therapy in a comprehensive inpatient rehab setting. Physiatrist is providing close team supervision and 24 hour management of active medical problems listed below. Physiatrist and rehab team continue to assess  barriers to discharge/monitor patient progress toward functional and medical goals  Care Tool:  Bathing    Body parts bathed by patient: Right arm, Left arm, Chest, Abdomen, Front perineal area, Buttocks, Right upper leg, Left upper leg, Right lower leg, Left lower leg, Face         Bathing assist Assist Level: Supervision/Verbal cueing     Upper Body Dressing/Undressing Upper body dressing   What is the patient wearing?: Pull over shirt    Upper body assist Assist Level: Set up assist    Lower Body Dressing/Undressing Lower body dressing      What is the patient wearing?: Underwear/pull up, Pants     Lower body assist Assist for lower body dressing: Supervision/Verbal cueing     Toileting Toileting    Toileting assist Assist for toileting: Contact Guard/Touching assist     Transfers Chair/bed transfer  Transfers assist     Chair/bed transfer assist level: Supervision/Verbal cueing     Locomotion Ambulation   Ambulation assist      Assist level: Contact Guard/Touching assist Assistive device: Walker-rolling Max distance: 90   Walk 10 feet activity   Assist     Assist level: Minimal Assistance - Patient > 75% Assistive device: Walker-rolling   Walk 50 feet activity  Assist    Assist level: Minimal Assistance - Patient > 75% Assistive device: Walker-rolling    Walk 150 feet activity   Assist Walk 150 feet activity did not occur: Safety/medical concerns         Walk 10 feet on uneven surface  activity   Assist     Assist level: Minimal Assistance - Patient > 75% Assistive device: Walker-rolling   Wheelchair     Assist Is the patient using a wheelchair?: Yes Type of Wheelchair: Manual    Wheelchair assist level: Supervision/Verbal cueing Max wheelchair distance: 150+    Wheelchair 50 feet with 2 turns activity    Assist        Assist Level: Supervision/Verbal cueing   Wheelchair 150 feet activity      Assist      Assist Level: Supervision/Verbal cueing   Blood pressure 127/78, pulse 71, temperature 98.2 F (36.8 C), temperature source Oral, resp. rate 18, height 5\' 6"  (1.676 m), weight 90.9 kg, SpO2 93%.  Medical Problem List and Plan: 1. 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              -patient may shower             -ELOS/Goals: 10-12 days S             -Continue CIR therapies including PT, OT, and SLP  2.  Antithrombotics: -DVT/anticoagulation:  Pharmaceutical: Lovenox             -antiplatelet therapy: DAPT X 3 weeks followed by ASA alone.  3. Diabetic neuropathy/Pain Management:  Continue gabapentin tid 4. Mood/Behavior/Sleep:  LCSW to follow for evaluation and support             --trazodone prn. Pt would like to keep it PRN  -will limit interruptions overnight             -antipsychotic agents: N/A 5. Neuropsych/cognition: This patient is not capable of making decisions on his own behalf. 6. Skin/Wound Care: Routine pressure relief measures.  7. Fluids/Electrolytes/Nutrition: Monitor I/O  -Encourage PO  -I personally reviewed the patient's labs today.   8. HTN: Monitor BP TID- continue to hold amlodipine and hyzaar            Vitals:   06/08/23 0557 06/08/23 0557  BP: 127/78   Pulse: 71 71  Resp: 18   Temp: 98.2 F (36.8 C)   SpO2: (!) 89% 93%  Reasonable control thus far 8/20  -will add magnesium gluconate 250mg  HS. 9. T2DM: Better control with Hgb A1C-8.5( 02/24)-->6.5 @ admission. Monitor BS ac/hs             --SSI for elevated BS             --On farxiga daily CBG (last 3)  Recent Labs    06/06/23 0613 06/07/23 0554 06/08/23 0547  GLUCAP 87 144* 162*  Controlled 8/20, daily CBGs 10. Intermittent hypokalemia: Recheck in am. 11. Constipation: continue colace and miralax.  12. Tobacco/Marijuana use: Educate on importance of cessation.  13. COPD?: I PPD smoker PTA. Started combivent 8/19- will schedule  QID on 8/20   14. Screening for vitamin D deficiency: check vitamin D level tomorrow.  15.  Troch bursitis Left sportscreme and PT - no c/o today   LOS: 6 days A FACE TO FACE EVALUATION WAS PERFORMED  Erick Colace 06/08/2023, 8:05 AM

## 2023-06-08 NOTE — Plan of Care (Signed)
  Problem: Consults Goal: RH STROKE PATIENT EDUCATION Description: See Patient Education module for education specifics  Outcome: Progressing   Problem: RH BOWEL ELIMINATION Goal: RH STG MANAGE BOWEL WITH ASSISTANCE Description: STG Manage Bowel with min Assistance. Outcome: Progressing   Problem: RH BOWEL ELIMINATION Goal: RH STG MANAGE BOWEL W/MEDICATION W/ASSISTANCE Description: STG Manage Bowel with Medication with min Assistance. Outcome: Progressing   Problem: RH BLADDER ELIMINATION Goal: RH STG MANAGE BLADDER WITH ASSISTANCE Description: STG Manage Bladder With min Assistance Outcome: Progressing

## 2023-06-08 NOTE — Progress Notes (Signed)
Patient ID: Bradley Morrison, male   DOB: 04/15/1976, 47 y.o.   MRN: [id number]  Sw met with patient to discuss and questions or concerns. Pt upset he did not receive breakfast this morning. Sw informed patient he will have a covering SW the remainder of the week. No additional questions or concerns.

## 2023-06-08 NOTE — Progress Notes (Signed)
Physical Therapy Session Note  Patient Details  Name: Bradley Morrison MRN: [id number] Date of Birth: 05/14/1976  Today's Date: 06/08/2023 PT Individual Time: 0806-0901 PT Individual Time Calculation (min): 55 min   Short Term Goals: Week 1:  PT Short Term Goal 1 (Week 1): STG=LTGs 2/2 ELOS  Skilled Therapeutic Interventions/Progress Updates:  Patient supine in bed on entrance to room. Patient alert and agreeable to PT session. Slept better last night but has never received breakfast. Also with complaint that he is not being allowed to ambulate to toilet in order to urinate during the night. RN notified and Care Plan updated.  Patient with no pain complaint at start of session.  Therapeutic Activity: Bed Mobility: Pt performed supine <> sit with IND/ Mod I. No vc required for technique. While seated EOB, dons socks and shoes independently/ with setup. Transfers: Pt performs ambulatory transfer to toilet with RW and voids continently with distant supervision.  Pt performed sit<>stand and stand pivot transfers throughout session with supervision/ close supervision. Provided min vc for hand positioning.  Gait Training/ NMR:  Pt transported via w/c to outdoor patio outside of [facility]. Guided in ambulation up/ down ramped entrance to [facility] along sidewalk with focus on maintaining L knee mobility/ stability throughout. Provided with vc for increased hip/ knee flexion and DF for improved R foot clearance as well as for soft but strong hold of R knee in stance phase for decreased uncontrolled hyperextension into GR. Performed well both in descent and return ascent after seated rest at bottom of entrance. Overall slow pace but maintains conscious practice over entire distance and then continuing along patio to far entrance. Manages RW well over threshold and rug on inside of doorway.   NMR performed for improvements in motor control and coordination, balance, sequencing, judgement, and self confidence/  efficacy in performing all aspects of mobility at highest level of independence.   Patient seated upright in w/c at end of session with brakes locked, belt alarm set, and all needs within reach.  Therapy Documentation Precautions:  Precautions Precautions: Fall Precaution Comments: old residual left hemiparesis Restrictions Weight Bearing Restrictions: No  Pain: Pain Assessment Pain Scale: 0-10 Pain Score: 0-No pain related this session.    Therapy/Group: Individual Therapy  Loel Dubonnet PT, DPT, CSRS 06/08/2023, 9:16 AM

## 2023-06-09 LAB — GLUCOSE, CAPILLARY: Glucose-Capillary: 118 mg/dL — ABNORMAL HIGH (ref 70–99)

## 2023-06-09 NOTE — Progress Notes (Signed)
Speech Language Pathology Daily Session Note  Patient Details  Name: ROBERTSON MODGLIN MRN: [id number] Date of Birth: 1976/04/10  Today's Date: 06/09/2023 SLP Individual Time: 1100-1200 SLP Individual Time Calculation (min): 60 min  Short Term Goals: Week 1: SLP Short Term Goal 1 (Week 1): Patient will consume least restrictive diet utilizing compensatory with no overt s/sx of aspiration with min verbal A SLP Short Term Goal 2 (Week 1): Patient will increase speech intelligibility to 90% at the conversational level utilizing compensatory strategies with min verbal A SLP Short Term Goal 3 (Week 1): Patient will demonstrate problem solving skills during mildly complex functional tasks with min multimodal A SLP Short Term Goal 4 (Week 1): Patient will recall and utilize memory compensatory strategies with mulitmodal minA  Skilled Therapeutic Interventions: Skilled therapy session focused on cognitive goals. SLP accompanied patient to gift shop and provided supervision A for patient to identify objects in different categories (under $5, over $50, for a baby etc.). Upon return, patient completed vacation scheduling and budgeting task with supervision verbal A. Patient utilized vacation budget to appropriately plan cost for food, gas and hotel stay. Patient reports cognitive improvements since admission and feels comfortable for d/c. Patient left in chair with alarm set and call bell in reach. Continue POC  Pain Pain Assessment Pain Score: 0-No pain  Therapy/Group: Individual Therapy  Azlaan Isidore M.A., CF-SLP 06/09/2023, 12:10 PM

## 2023-06-09 NOTE — Progress Notes (Signed)
Patient ID: Bradley Morrison, male   DOB: [birth year], 47 y.o.   MRN: [id number]  This SW covering for primary SW, Chrisitna Baskerville.   During team conference, reports indicates if able to confirm DME and supoprt pt will need at hoime, d/c date can be moved to 8/23 versus current date of 8/27. SW will confirm. HH therapies recommended.   SW went by pt room to provide updates from team conference, but pt not in room. SW will follow-up.   1415-SW spoke with pt mother Bradley Morrison to discuss discharge plan and support at home. She confirms pt will d/c to her home, and she is currently on FMLA. States she has atleast two weeks remaining, and she will get an extension if allowed--if not, pt father will come  to her home to provide care to him. She confirms he has DME: RW and TTB. Fam edu scheduled for tomorrow 8/22 1pm-4pm with pt mother. SW will confirm with attending d/c can remain for Friday (8/23). Preferred [facility] is [facility].   SW sent referral to [facility] and waiting on follow-up.  [name], MSW, LCSWA Office: [phone] Cell: [phone] Fax: [phone]

## 2023-06-09 NOTE — Progress Notes (Signed)
PROGRESS NOTE   Subjective/Complaints:  No issues overnite , has FMLA papers for his father   ROS: Patient denies CP, SOB, N/V/D  Objective:   No results found. Recent Labs    06/07/23 0612  WBC 10.5  HGB 13.8  HCT 43.0  PLT 370    Recent Labs    06/07/23 0612  NA 136  K 3.6  CL 104  CO2 24  GLUCOSE 136*  BUN 12  CREATININE 0.95  CALCIUM 8.8*     Intake/Output Summary (Last 24 hours) at 06/09/2023 0904 Last data filed at 06/09/2023 0834 Gross per 24 hour  Intake 830 ml  Output 1625 ml  Net -795 ml        Physical Exam: Vital Signs Blood pressure 125/75, pulse 77, temperature 98.3 F (36.8 C), resp. rate 18, height 5\' 6"  (1.676 m), weight 90.9 kg, SpO2 93%.   General: No acute distress Mood and affect are appropriate Heart: Regular rate and rhythm no rubs murmurs or extra sounds Lungs: Clear to auscultation, breathing unlabored, no rales or wheezes Abdomen: Positive bowel sounds, soft nontender to palpation, nondistended Extremities: No clubbing, cyanosis, or edema  Skin: No evidence of breakdown, no evidence of rash Neurologic: Cranial nerves II through XII intact except for left facial weakness--speech slightly slurred, motor strength is 5/5 in bilateral deltoid, bicep, tricep, grip, 5/5 RIght and 3- left hip flexor, knee extensors, ankle dorsiflexor and plantar flexor. Good sitting balance. Sensory exam normal sensation to light touch and proprioception in bilateral upper and lower extremities  Musculoskeletal: Full range of motion in all 4 extremities. No joint swelling ,   Assessment/Plan: 1. Functional deficits which require 3+ hours per day of interdisciplinary therapy in a comprehensive inpatient rehab setting. Physiatrist is providing close team supervision and 24 hour management of active medical problems listed below. Physiatrist and rehab team continue to assess barriers to  discharge/monitor patient progress toward functional and medical goals  Care Tool:  Bathing    Body parts bathed by patient: Right arm, Left arm, Chest, Abdomen, Front perineal area, Buttocks, Right upper leg, Left upper leg, Right lower leg, Left lower leg, Face         Bathing assist Assist Level: Supervision/Verbal cueing     Upper Body Dressing/Undressing Upper body dressing   What is the patient wearing?: Pull over shirt    Upper body assist Assist Level: Set up assist    Lower Body Dressing/Undressing Lower body dressing      What is the patient wearing?: Underwear/pull up, Pants     Lower body assist Assist for lower body dressing: Supervision/Verbal cueing     Toileting Toileting    Toileting assist Assist for toileting: Contact Guard/Touching assist     Transfers Chair/bed transfer  Transfers assist     Chair/bed transfer assist level: Supervision/Verbal cueing     Locomotion Ambulation   Ambulation assist      Assist level: Contact Guard/Touching assist Assistive device: Walker-rolling Max distance: 90   Walk 10 feet activity   Assist     Assist level: Minimal Assistance - Patient > 75% Assistive device: Walker-rolling   Walk 50 feet activity  Assist    Assist level: Minimal Assistance - Patient > 75% Assistive device: Walker-rolling    Walk 150 feet activity   Assist Walk 150 feet activity did not occur: Safety/medical concerns         Walk 10 feet on uneven surface  activity   Assist     Assist level: Minimal Assistance - Patient > 75% Assistive device: Walker-rolling   Wheelchair     Assist Is the patient using a wheelchair?: Yes Type of Wheelchair: Manual    Wheelchair assist level: Supervision/Verbal cueing Max wheelchair distance: 150+    Wheelchair 50 feet with 2 turns activity    Assist        Assist Level: Supervision/Verbal cueing   Wheelchair 150 feet activity     Assist       Assist Level: Supervision/Verbal cueing   Blood pressure 125/75, pulse 77, temperature 98.3 F (36.8 C), resp. rate 18, height 5\' 6"  (1.676 m), weight 90.9 kg, SpO2 93%.  Medical Problem List and Plan: 1. 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              -patient may shower             -ELOS/Goals: 10-12 days S             -Continue CIR therapies including PT, OT, and SLP  Team conference today please see physician documentation under team conference tab, met with team  to discuss problems,progress, and goals. Formulized individual treatment plan based on medical history, underlying problem and comorbidities.  2.  Antithrombotics: -DVT/anticoagulation:  Pharmaceutical: Lovenox             -antiplatelet therapy: DAPT X 3 weeks followed by ASA alone.  3. Diabetic neuropathy/Pain Management:  Continue gabapentin tid 4. Mood/Behavior/Sleep:  LCSW to follow for evaluation and support             --trazodone prn. Pt would like to keep it PRN  -will limit interruptions overnight             -antipsychotic agents: N/A 5. Neuropsych/cognition: This patient is not capable of making decisions on his own behalf. 6. Skin/Wound Care: Routine pressure relief measures.  7. Fluids/Electrolytes/Nutrition: Monitor I/O  -Encourage PO  -I personally reviewed the patient's labs today.   8. HTN: Monitor BP TID- continue to hold amlodipine and hyzaar            Vitals:   06/08/23 2058 06/09/23 0450  BP:  125/75  Pulse:  77  Resp:  18  Temp:  98.3 F (36.8 C)  SpO2: 94% 93%  Reasonable control thus far 8/21  -will add magnesium gluconate 250mg  HS. 9. T2DM: Better control with Hgb A1C-8.5( 02/24)-->6.5 @ admission. Monitor BS ac/hs             --SSI for elevated BS             --On farxiga daily CBG (last 3)  Recent Labs    06/07/23 0554 06/08/23 0547 06/09/23 0559  GLUCAP 144* 162* 118*  Controlled 8/20, daily CBGs 10. Intermittent hypokalemia:  Recheck in am. 11. Constipation: continue colace and miralax.  12. Tobacco/Marijuana use: Educate on importance of cessation.  13. COPD?: I PPD smoker PTA. Started combivent 8/19- will schedule QID on 8/20- lungs sound better today    14. Screening for vitamin D deficiency: check vitamin D level tomorrow.  15.  Troch bursitis Left sportscreme and PT -  no further c/os  LOS: 7 days A FACE TO FACE EVALUATION WAS PERFORMED  Erick Colace 06/09/2023, 9:04 AM

## 2023-06-09 NOTE — Progress Notes (Signed)
Occupational Therapy Session Note  Patient Details  Name: Bradley Morrison MRN: [id number] Date of Birth: 1976-09-27  Today's Date: 06/09/2023 OT Individual Time: 4098-1191 OT Individual Time Calculation (min): 56 min    Short Term Goals: Week 2:  OT Short Term Goal 1 (Week 2): LTG= STG  Skilled Therapeutic Interventions/Progress Updates:  Pt greeted supine in bed with NT present for bladder scan. Pt completed supine>sit MODI. Pt completed stand pivot to w/c with Rw MODI. Total A transport to gym as pt reports fatigue in BLEs. In gym, worked on various therapeutic activities/BUE therex to increase strength/endurance and dynamic standing balance for higher level ADLs. Pt completed functional mobility obstacle course with Rw with pt stepping over obstacles and around cones to simulate mobility in home environment, pt completed task with CGA with no LOB.   Pt able to stand with no UE support to bounce ball against ball and catch and release with CGA with no LOB, graded task up and had pt stand from w/c, toss ball- catch and sit, pt completed task with CGA x10 reps with no LOB. Pt completed dynamic balance task with pt standing with no UE support to pass 3 lb weighted ball around back x10 reps each direction to simulate LB ADLs, pt completed task with CGA.   Pt also transitioned into quadraped on mat table with CGA with pt completing unilateral bird dogs to facilitate NMR to LUE/LLE. Pt able to reach across midline with RUE to transport bean bags/cones from R side of mat to L side with CGA with an emphasis on WB'ing into LUE for NMR.   Pt completed 3 mins of forward/backward propulsion on scifit to increase BUE strength/endurance for higher level functional mobiliy tasks.   Pt completed functional ambulation back to room with Rw and supervision. Ended session with pt seated in w/c with all needs within reach.  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

## 2023-06-09 NOTE — Patient Care Conference (Signed)
Inpatient RehabilitationTeam Conference and Plan of Care Update Date: 06/09/2023   Time: 10:15 AM    Patient Name: Bradley Morrison      Medical Record Number: [id number]  Date of Birth: October 31, 1975 Sex: Male         Room/Bed: 4W13C/4W13C-01 Payor Info: Payor: [employer] MEDICARE / Plan: [employer] MEDICARE HMO / Product Type: *No Product type* /    Admit Date/Time:  06/02/2023 12:11 PM  Primary Diagnosis:  Cerebrovascular accident (CVA) of pontine structure [facility])  Hospital Problems: Principal Problem:   Cerebrovascular accident (CVA) of pontine structure [facility])    Expected Discharge Date: Expected Discharge Date: 06/15/23  Team Members Present: Physician leading conference: Dr. Claudette Laws Social Worker Present: [name], LCSWA Nurse Present: Chana Bode, RN PT Present: Ralph Leyden, PT OT Present: Bonnell Public, OT SLP Present: Everardo Pacific, SLP PPS Coordinator present : Fae Pippin, SLP     Current Status/Progress Goal Weekly Team Focus  Bowel/Bladder      Continent of bladder w toileting; constipation addressed    Continent of bowel and bladder   Toileting protocol; monitor need for and effectiveness of laxatives   Swallow/Nutrition/ Hydration   dys 3 diet, thin liquids w/ chin tuck   supervision  toleration of current diet and trial regular/thin    ADL's   supervision for ADLS with Rw   MODI   dynamic balance, energy conservation, activity tolerance, IADLs    Mobility   Bed mobility = Mod I, transfers = supervision, ambulation with RW = close supervision   Mod I  Barriers: limited supervision avail at home, continued L sided weakness /// Work on: standing balance, activity tolerance, general strength, L NMR, family educ    Communication   90-100% intelligible at conversational level   supervision - increase goal to modI   continue utilzation of strategies without cues    Safety/Cognition/ Behavioral Observations  supervision-minA w/ mildly complex  problem solving tasks   supervision   continue to increase complexity of problem solving tasks, recall and utilization of memory strategies    Pain      Tylenol prn    Pain < 4 with prns    Assess pain q shift; monitor need for and effectiveness of prns  Skin      N/a            Discharge Planning:  Discharghing home with mother in [place], [place]. 5 steps to enter the home. Mother works and unable to provide 24/7 supervision.   Team Discussion: Patient post pontine CVA with dynamic balance issues, activity intolerance, and poor safety awareness.  Patient on target to meet rehab goals: yes, currently needs supervision for ADLs using a RW and supervision for transfers and close supervision for ambulation using a RW. Maintained on a dysphagia 3 thin liquid using a chin tuck. Needs supervision for communication and cognition. Working on IADLs, problem solving and recall of strategies with mod I goals for discharge.  *See Care Plan and progress notes for long and short-term goals.   Revisions to Treatment Plan:  N/a   Teaching Needs: Family education, medications, dietary modification, transfers, toileting, etc.   Current Barriers to Discharge: Home enviroment access/layout and Lack of/limited family support  Possible Resolutions to Barriers: Family education HH follow up services     Medical Summary Current Status: DM control and BP control is ok , poor safety awareness, working on advancing diet     Possible Resolutions to [employer] Focus: cont with med  management , improve safety awareness and activity [place]   Continued Need for Acute Rehabilitation Level of Care: The patient requires daily medical management by a physician with specialized training in physical medicine and rehabilitation for the following reasons: Direction of a multidisciplinary physical rehabilitation program to maximize functional independence : Yes Medical management of patient  stability for increased activity during participation in an intensive rehabilitation regime.: Yes Analysis of laboratory values and/or radiology reports with any subsequent need for medication adjustment and/or medical intervention. : Yes   I attest that I was present, lead the team conference, and concur with the assessment and plan of the team.   Chana Bode B 06/09/2023, 1:55 PM

## 2023-06-09 NOTE — Progress Notes (Signed)
Physical Therapy Session Note  Patient Details  Name: Bradley Morrison MRN: [id number] Date of Birth: March 26, 1976  Today's Date: 06/09/2023 PT Individual Time: 4401-0272; 1425 - 1320  PT Individual Time Calculation (min): 45 min   Short Term Goals: Week 1:  PT Short Term Goal 1 (Week 1): STG=LTGs 2/2 ELOS  SESSION 1 Skilled Therapeutic Interventions/Progress Updates: Patient in [facility] on entrance to room. Patient alert and agreeable to PT session.   Patient reported no pain at beginning of PT session.  Therapeutic Activity: Transfers: Pt performed sit<>stand transfers throughout session with supervision for safety. Pt transported in [facility] outside for gait training dependently for time management.  Gait Training:  Pt ambulated 200'+ using RW on noncompliant surfaces with supervision for safety throughout. Pt cued to increase step clearance on L LE per presentation of catching toe of shoe on pavement, and to control eccentric of midstance to prevent hyperextension on L knee.  Stair Navigation: - Ascending 4 (6") steps with use of BHR, and with close supervision required for safety. Pt with step to pattern leading with R LE pattern. - Descending 4 (6") steps with use of BHR, and with close supervision required for safety. Pt with step to pattern leading with L LE.  Pt with VC to ensure entirety of foot is on step. Pt reported feeling like he could lift L LE higher after ambulating with ankle weight.   Neuromuscular Re-ed: NMR facilitated during session with focus on R LE proprioceptive feedback. - Ambulation in main gym with 6lb ankle weight on L LE for error augmentation to increase L LE feedback for step clearance carryover. Pt without AD and with CGA/light minA for safety to maintain standing balance. Pt then navigated stairs.  NMR performed for improvements in motor control and coordination, balance, sequencing, judgement, and self confidence/ efficacy in performing all aspects of mobility at  highest level of independence.   Patient sitting in WC at end of session with brakes locked, chair alarm set, and all needs within reach.   SESSION 2 Skilled Therapeutic Interventions/Progress Updates: Patient in [facility] on entrance to room. Patient alert and agreeable to PT session.   Patient reported frustration with d/c date due to updated assistance that will be provided at home. OT reached out to team and LCSW is pushing for 8/23 per mother reporting extended FMLA and can provide home care.  Therapeutic Activity: Transfers: Pt performed sit<>stands throughout session with supervision for safety. No VC required.   - Pt required personal care to change personal pants and boxers due to urine void. Pt ambulated to bathroom with RW and supervision. Pt doffed personal shorts with CGA for safety, and then required minA to thread through new shorts all while in standing.  - Pt ambulated to bathroom without AD to void bladder while standing over toilet with supervision for safety and provided self hygiene care with wet wash cloth.   Stair Navigation:  - Ascending 4 (6") steps with use of BHR, and with close supervsion required for safety. Pt with cues for reciprocal pattern. - Descending 4 (6") steps with use of BHR, and with close supervision required for safety. Pt with cues for reciprocal pattern.  Pt performed after ankle weight NMR and performed until fatigue (5 laps). Pt initially with close supervision, then progressed to supervision and demos reciprocal pattern throughout entire stair trial.   Neuromuscular Re-ed: NMR facilitated during session with focus on R LE coordination, clearance, proprioceptive feedback. - Pt ambulated around main gym  with 8lb ankle weight on L LE for error augmentation, and to increase neuromuscular control/step clearance. Pt with intermittent CGA/minA for upright standing balance. Pt cued to take slower, more intentional steps and was close supervision for safety  with PTA in front as to allow pt body to correct balance deficiency per anterior weight shift tendency. - Pt with 8lb ankle weight on L LE with instructions to anteriorly step to 6" step with L LE, and then to step back down. Pt performed till fatigue x 2 with R UE on hand rail. Pt then with R LE on 1st step with cues to step with L LE (still with ankle weight) up to 2nd step in order to promote reciprocal step pattern with error augmentation. Pt performed till failure and had a rest break  NMR performed for improvements in motor control and coordination, balance, sequencing, judgement, and self confidence/ efficacy in performing all aspects of mobility at highest level of independence.   Patient in [facility] at end of session with brakes locked, chair alarm set, and all needs within reach.       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

## 2023-06-09 NOTE — Progress Notes (Signed)
Occupational Therapy Weekly Progress Note  Patient Details  Name: Bradley Morrison MRN: [id number] Date of Birth: Nov 28, 1975  Beginning of progress report period: June 03, 2023 End of progress report period: June 09, 2023  Today's Date: 06/09/2023   Pt working towards LTGs at [employer] level with pt currently completing ambulatory ADLs with Rw and supervision nearing MODI. Plan to DC home with mother however mother works with pt needing to be as independent as possible. No family ed currently scheduled. Pt continues to present with impaired balance and decreased activity tolerance but overall progressing well with ADL participation and functional mobility. POC remains appropriate, DC set for 8/27.     Patient continues to demonstrate the following deficits: muscle weakness, decreased cardiorespiratoy endurance, unbalanced muscle activation, and decreased balance strategies and therefore will continue to benefit from skilled OT intervention to enhance overall performance with BADL and Reduce care partner burden.  Patient progressing toward long term goals..  Continue plan of care.  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

## 2023-06-09 NOTE — Discharge Summary (Signed)
Physician Discharge Summary  Patient ID: CHANCE DIPACE MRN: [id number] DOB/AGE: 03-31-1976 47 y.o.  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) Active Problems:   Hypertension associated with diabetes (HCC)   Obesity (BMI 30-39.9)   Dyslipidemia   Type 2 diabetes mellitus with peripheral neuropathy (HCC)   Constipation   Tobacco use disorder--1 PPD   Discharged Condition: stable  Significant Diagnostic Studies: N/a   Labs:  Basic Metabolic Panel:    Latest Ref Rng & Units 06/07/2023    6:12 AM 06/03/2023    9:29 AM 06/02/2023    2:57 AM  BMP  Glucose 70 - 99 mg/dL 329  518  99   BUN 6 - 20 mg/dL 12  16  20    Creatinine 0.61 - 1.24 mg/dL 8.41  6.60  6.30   Sodium 135 - 145 mmol/L 136  138  137   Potassium 3.5 - 5.1 mmol/L 3.6  3.6  3.5   Chloride 98 - 111 mmol/L 104  104  103   CO2 22 - 32 mmol/L 24  26  25    Calcium 8.9 - 10.3 mg/dL 8.8  9.2  9.1      CBC:    Latest Ref Rng & Units 06/07/2023    6:12 AM 06/03/2023    9:29 AM 06/02/2023    2:57 AM  CBC  WBC 4.0 - 10.5 K/uL 10.5  10.3  10.5   Hemoglobin 13.0 - 17.0 g/dL 16.0  10.9  32.3   Hematocrit 39.0 - 52.0 % 43.0  46.8  43.7   Platelets 150 - 400 K/uL 370  355  321      CBG: Recent Labs  Lab 06/06/23 0613 06/07/23 0554 06/08/23 0547 06/09/23 0559 06/10/23 1118  GLUCAP 87 144* 162* 118* 129*    Brief HPI:   Bradley Morrison is a 47 y.o. 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 [facility] was [facility] with right facial droop and worsening of dysarthria.  CT head/neck was negative for LVO or high-grade stenosis.  UDS was positive for cannabinoids.  MRI brain showed multifocal acute ischemia ventral pons.  Dr. Jerrell Belfast feels that patient with acute and subacute pontine infarct 2 weeks prior to admission with expansion now  involving the right pons as well and also small vessel etiology.  He recommended DAPT x 3 weeks followed by aspirin alone.    He did have worsening of symptoms on 08/11 with MRI showing abnormal diffusion more intense on the left with increased T2 intensity but no new areas of involvement.  Neurology felt that it was not uncommon for small vessel stroke to expand and get worse prior to starting to get better and to continue current management.  Swallow evaluation showed mild oral dysphagia and D2, thin liquids with chin tuck recommended. Therapy was working with patient who was limited by left-sided weakness with balance deficits and he required min to mod assist with ADLs.  CIR was recommended due to functional decline.   Hospital Course: NYKO ALTAMIRANO was admitted to rehab 06/02/2023 for inpatient therapies to consist of PT, ST and OT at least three hours five days a week. Past admission physiatrist, therapy team and rehab RN have worked together to provide customized collaborative inpatient rehab. He is to continue DAPT for 4 additional days followed by  ASA alone. His blood pressures were monitored on TID basis and is stable off medications. Follow up labs showed lytes and renal status to be WNL. Check of CBC showed H/H and WBC to be stable. His diabetes has been monitored with ac/hs CBG checks and SSI was use prn for tighter BS control.  Metformin was resumed and blood sugars have been reasonably controlled on current regimen with carb modified diet.    Left trochanteric bursitis has improved with use of Sportscreme for local measures.  His swallow function has improved and is advanced to dysphagia 3 but is to continue contact with thin liquids.  He is able to utilize swallow compensatory strategies at modified independent level.  He has been educated on importance of cessation of tobacco and marijuana use. He was noted to have expiratory wheezing which has improved with addition of Combivent.  He has  made good gains during his rehab stay and is modified independent in supervised setting.  He will continue to receive follow-up home health PT, OT and ST by [facility] home health after discharge.   Rehab course: During patient's stay in rehab weekly team conferences were held to monitor patient's progress, set goals and discuss barriers to discharge. At admission, patient required min assist with basic self care tasks and with mobility. He exhibited overt s/s of moderate oropharyngeal dysphagia, mild dysarthria as well as mild deficits in attention, STM and higher level cognitive tasks.  He  has had improvement in activity tolerance, balance, postural control as well as ability to compensate for deficits. He is able to complete ADL tasks at modified independent level. He is modified independent for transfers and to ambulate 300' with use of RW. He continues to present with mild deficits in [facility] and requires supervision with cognitive tasks. Family education has been completed.     Disposition: Home  Diet: Heart healthy/carb modified.  Chin tuck with thin liquids.  Special Instructions: Monitor blood sugars twice daily to 4 times daily basis and follow-up with PCP for adjustment of diabetic regimen. No driving or strenuous activity cleared by MD.  Discharge Instructions     Ambulatory referral to Neurology   Complete by: As directed    An appointment is requested in approximately: 4-6 weeks   Ambulatory referral to Physical Medicine Rehab   Complete by: As directed       Allergies as of 06/10/2023       Reactions   Zestril [lisinopril] Swelling   Lip swelling        Medication List     STOP taking these medications    amLODipine 10 MG tablet Commonly known as: NORVASC   losartan 25 MG tablet Commonly known as: COZAAR       TAKE these medications    acetaminophen 325 MG tablet Commonly known as: TYLENOL Take 1-2 tablets (325-650 mg total) by mouth every 4 (four) hours as  needed for mild pain.   aspirin 81 MG chewable tablet Chew 1 tablet (81 mg total) by mouth daily.   atorvastatin 80 MG tablet Commonly known as: LIPITOR Take 1 tablet (80 mg total) by mouth daily.   clopidogrel 75 MG tablet Commonly known as: PLAVIX Take 1 tablet (75 mg total) by mouth daily.   dapagliflozin propanediol 10 MG Tabs tablet Commonly known as: FARXIGA Take 1 tablet (10 mg total) by mouth daily.   docusate sodium 100 MG capsule Commonly known as: COLACE Take 2 capsules (200 mg total) by mouth 2 (two) times  daily.   gabapentin 300 MG capsule Commonly known as: NEURONTIN Take 1 capsule (300 mg total) by mouth 3 (three) times daily.   magnesium gluconate 500 MG tablet Commonly known as: MAGONATE Take 0.5 tablets (250 mg total) by mouth at bedtime.   metFORMIN 500 MG tablet Commonly known as: GLUCOPHAGE Take 1 tablet (500 mg total) by mouth daily with breakfast.   polyethylene glycol 17 g packet Commonly known as: MIRALAX / GLYCOLAX Take 17 g by mouth daily.   Stiolto Respimat 2.5-2.5 MCG/ACT Aers Generic drug: Tiotropium Bromide-Olodaterol Inhale 2 puffs into the lungs daily at 6 (six) AM.   traZODone 50 MG tablet Commonly known as: DESYREL Take 0.5-1 tablets (25-50 mg total) by mouth at bedtime as needed for sleep.   trolamine salicylate 10 % cream Commonly known as: ASPERCREME Apply topically 2 (two) times daily as needed for muscle pain. To left hip        Follow-up Information     [facility] Follow up.   Why: Call in 1-2 days for post hospital follow up Contact information: [address] [place] [address] [phone]         Erick Colace, MD Follow up.   Specialty: Physical Medicine and Rehabilitation Why: office will call you with follow up appointment Contact information: [address] [place] [address] [phone]         [facility] Follow up.   Why: Call  in 1-2 days for post hospital follow up Contact information: [address]     [address] [place] [address] [phone]                Signed: Jacquelynn Cree 06/10/2023, 6:50 PM

## 2023-06-10 DIAGNOSIS — K5900 Constipation, unspecified: Secondary | ICD-10-CM | POA: Insufficient documentation

## 2023-06-10 DIAGNOSIS — J449 Chronic obstructive pulmonary disease, unspecified: Secondary | ICD-10-CM

## 2023-06-10 DIAGNOSIS — R1310 Dysphagia, unspecified: Secondary | ICD-10-CM

## 2023-06-10 DIAGNOSIS — F17200 Nicotine dependence, unspecified, uncomplicated: Secondary | ICD-10-CM | POA: Insufficient documentation

## 2023-06-10 LAB — GLUCOSE, CAPILLARY: Glucose-Capillary: 129 mg/dL — ABNORMAL HIGH (ref 70–99)

## 2023-06-10 MED ORDER — TRAZODONE HCL 50 MG PO TABS
25.0000 mg | ORAL_TABLET | Freq: Every evening | ORAL | 0 refills | Status: DC | PRN
  Filled 2023-06-10: qty 30, 30d supply, fill #0

## 2023-06-10 MED ORDER — CLOPIDOGREL BISULFATE 75 MG PO TABS
75.0000 mg | ORAL_TABLET | Freq: Every day | ORAL | 0 refills | Status: DC
  Filled 2023-06-10: qty 7, 7d supply, fill #0

## 2023-06-10 MED ORDER — DAPAGLIFLOZIN PROPANEDIOL 10 MG PO TABS
10.0000 mg | ORAL_TABLET | Freq: Every day | ORAL | 0 refills | Status: DC
  Filled 2023-06-10: qty 30, 30d supply, fill #0

## 2023-06-10 MED ORDER — MAGNESIUM OXIDE 400 MG PO TABS
200.0000 mg | ORAL_TABLET | Freq: Every day | ORAL | 0 refills | Status: DC
  Filled 2023-06-10: qty 30, 60d supply, fill #0

## 2023-06-10 MED ORDER — IPRATROPIUM-ALBUTEROL 0.5-2.5 (3) MG/3ML IN SOLN
3.0000 mL | Freq: Two times a day (BID) | RESPIRATORY_TRACT | Status: DC
  Administered 2023-06-11: 3 mL via RESPIRATORY_TRACT
  Filled 2023-06-10 (×2): qty 3

## 2023-06-10 MED ORDER — STIOLTO RESPIMAT 2.5-2.5 MCG/ACT IN AERS
2.0000 | INHALATION_SPRAY | Freq: Every day | RESPIRATORY_TRACT | 0 refills | Status: DC
  Filled 2023-06-10: qty 4, 30d supply, fill #0

## 2023-06-10 MED ORDER — ASPIRIN 81 MG PO CHEW
81.0000 mg | CHEWABLE_TABLET | Freq: Every day | ORAL | 0 refills | Status: DC
Start: 2023-06-10 — End: 2023-06-11
  Filled 2023-06-10: qty 90, 90d supply, fill #0

## 2023-06-10 MED ORDER — ASPIRIN 81 MG PO CHEW: 81.0000 mg | CHEWABLE_TABLET | Freq: Every day | ORAL | 0 refills | Status: DC

## 2023-06-10 MED ORDER — ATORVASTATIN CALCIUM 80 MG PO TABS
80.0000 mg | ORAL_TABLET | Freq: Every day | ORAL | 0 refills | Status: DC
  Filled 2023-06-10 – 2023-06-11 (×2): qty 30, 30d supply, fill #0

## 2023-06-10 MED ORDER — POLYETHYLENE GLYCOL 3350 17 GM/SCOOP PO POWD
17.0000 g | Freq: Every day | ORAL | 0 refills | Status: DC
  Filled 2023-06-10: qty 476, 28d supply, fill #0

## 2023-06-10 MED ORDER — GABAPENTIN 300 MG PO CAPS
300.0000 mg | ORAL_CAPSULE | Freq: Three times a day (TID) | ORAL | 0 refills | Status: DC
  Filled 2023-06-10: qty 90, 30d supply, fill #0

## 2023-06-10 MED ORDER — ALBUTEROL SULFATE (2.5 MG/3ML) 0.083% IN NEBU: 2.5000 mg | INHALATION_SOLUTION | RESPIRATORY_TRACT | Status: DC | PRN

## 2023-06-10 MED ORDER — DOCUSATE SODIUM 100 MG PO CAPS
200.0000 mg | ORAL_CAPSULE | Freq: Two times a day (BID) | ORAL | 0 refills | Status: DC
  Filled 2023-06-10: qty 100, 25d supply, fill #0

## 2023-06-10 MED ORDER — TROLAMINE SALICYLATE 10 % EX CREA
TOPICAL_CREAM | Freq: Two times a day (BID) | CUTANEOUS | 0 refills | Status: DC | PRN
  Filled 2023-06-10: qty 85, fill #0

## 2023-06-10 MED ORDER — METFORMIN HCL 500 MG PO TABS
500.0000 mg | ORAL_TABLET | Freq: Every day | ORAL | 0 refills | Status: DC
  Filled 2023-06-10: qty 30, 30d supply, fill #0

## 2023-06-10 MED ORDER — CLOPIDOGREL BISULFATE 75 MG PO TABS
75.0000 mg | ORAL_TABLET | Freq: Every day | ORAL | 0 refills | Status: DC
  Filled 2023-06-10: qty 8, 8d supply, fill #0

## 2023-06-10 NOTE — Plan of Care (Signed)
Problem: RH Swallowing Goal: LTG Patient will consume least restrictive diet using compensatory strategies with assistance (SLP) Description: LTG:  Patient will consume least restrictive diet using compensatory strategies with assistance (SLP) Outcome: Completed/Met   Problem: RH Expression Communication Goal: LTG Patient will increase speech intelligibility (SLP) Description: LTG: Patient will increase speech intelligibility at word/phrase/conversation level with cues, % of the time (SLP) Outcome: Completed/Met   Problem: RH Problem Solving Goal: LTG Patient will demonstrate problem solving for (SLP) Description: LTG:  Patient will demonstrate problem solving for basic/complex daily situations with cues  (SLP) Outcome: Completed/Met   Problem: RH Memory Goal: LTG Patient will use memory compensatory aids to (SLP) Description: LTG:  Patient will use memory compensatory aids to recall biographical/new, daily complex information with cues (SLP) Outcome: Completed/Met

## 2023-06-10 NOTE — Progress Notes (Addendum)
Occupational Therapy Session Note  Patient Details  Name: Bradley Morrison MRN: [id number] Date of Birth: 1976/09/15  Today's Date: 06/10/2023 OT Individual Time: 1350-1430 OT Individual Time Calculation (min): 40 min    Short Term Goals: Week 1:  OT Short Term Goal 1 (Week 1): LTG=STG Week 2:  OT Short Term Goal 1 (Week 2): LTG= STG  Skilled Therapeutic Interventions/Progress Updates:    Pt seen this session for family education with his mom.  Suggested to pt that he shower today as I had not seen OT documentation of pt showering during the OT sessions. Pt stated and his nurse tech, [name], verified that pt has been showering every morning without A.  Pt felt more comfortable with a male caregiver.  Pt reports that he has not had any difficulty completing self care. Pt able to demonstrate this session using his RW to ambulate from bed to toilet, managing the heavy bathroom door, all without A.   For balance challenges, pt worked on standing at dressing and using R hand to reach to floor to pick up 6 items off the floor, standing without UE support to fasten nuts and bolts, with light UE support pt worked on Left lateral lunge stepping and then without Ue support squats standing in front of heavy arm chair.   Pt is doing well physically and feels ready to go home.  Reviewed with mom he is independent with self care but should have some supervision initially with cooking and cleaning. Discussed recommendation of waiting a few weeks for driving and talking with MD on follow up visit first.   Pt's PT arrived for his next session.   Therapy Documentation Precautions:  Precautions Precautions: Fall Restrictions Weight Bearing Restrictions: No  Pain: Pain Assessment Pain Score: 0-No pain ADL: ADL Eating: Independent Grooming: Setup Upper Body Bathing: Independent Where Assessed-Upper Body Bathing: Shower Lower Body Bathing: Modified independent Where Assessed-Lower Body Bathing:  Shower Upper Body Dressing: Independent Where Assessed-Upper Body Dressing: Edge of bed Lower Body Dressing: Modified independent Where Assessed-Lower Body Dressing: Edge of bed Toileting: Modified independent Where Assessed-Toileting: Teacher, adult education: Engineer, agricultural Method: Insurance claims handler: Modified independent Web designer Method: Ship broker: Biochemist, clinical Transfer: Modified independent Film/video editor Method: Designer, industrial/product: Shower seat with back   Therapy/Group: Individual Therapy  [name] 06/10/2023, 2:59 PM

## 2023-06-10 NOTE — Progress Notes (Signed)
Patient ID: STAVON BAZAN, male   DOB: [birth year], 47 y.o.   MRN: [id number]  This SW covering for primary SW, Chrisitna Baskerville.   Attending confirms d/c for tomorrow.  SW waiting on follow-up from [facility] about referral.  *referral accepted.   SW provided pt with completed FMLA forms. SW will confirm d/c address during family edu as pt reports to confirm with his mother Elnita Maxwell.   *SW confirmed her address and informed on HHA.   D/c address: [address]  [place] [address]   [name], MSW, [place] Office: [phone] Cell: [phone] Fax: [phone]

## 2023-06-10 NOTE — Progress Notes (Signed)
Physical Therapy Discharge Summary  Patient Details  Name: Bradley Morrison MRN: [id number] Date of Birth: 1976-05-31  Date of Discharge from PT service:June 10, 2023  Patient has met 12 of 12 long term goals due to improved activity tolerance, improved balance, improved postural control, increased strength, and improved coordination.  Patient to discharge at an ambulatory level Modified Independent.   Patient's care partner is independent to provide the necessary physical assistance at discharge.  Reasons goals not met: n/a  Recommendation:  Patient will benefit from ongoing skilled PT services in neuro outpatient setting to continue to advance safe functional mobility, address ongoing impairments in strength, coordination, balance, activity tolerance, cognition, safety awareness, and to minimize fall risk.  Equipment: No equipment provided  Reasons for discharge: treatment goals met  Patient/family agrees with progress made and goals achieved: Yes  PT Discharge Precautions/Restrictions Precautions Precautions: Fall Precaution Comments: Mild L hemi Restrictions Weight Bearing Restrictions: No Vital Signs   Pain Pain Assessment Pain Scale: 0-10 Pain Score: 0-No pain Pain Interference Pain Interference Pain Effect on Sleep: 1. Rarely or not at all Pain Interference with Therapy Activities: 1. Rarely or not at all Pain Interference with Day-to-Day Activities: 1. Rarely or not at all Vision/Perception  Vision - History Ability to See in Adequate Light: 0 Adequate Vision - Assessment Eye Alignment: Within Functional Limits Ocular Range of Motion: Within Functional Limits Alignment/Gaze Preference: Within Defined Limits Tracking/Visual Pursuits: Able to track stimulus in all quads without difficulty Perception Perception: Within Functional Limits Praxis Praxis: WFL  Cognition Arousal/Alertness: Awake/alert Orientation Level: Oriented X4 Sensation Sensation Light  Touch: Appears Intact Hot/Cold: Appears Intact Proprioception: Appears Intact Coordination Gross Motor Movements are Fluid and Coordinated: No Fine Motor Movements are Fluid and Coordinated: No Finger Nose Finger Test: Slightly off at first, then able to correct after a few tries to tip of finger and nose Heel Shin Test: able to move less than a quarter up lower leg Motor  Motor Motor: Other (comment) (hemiparesis) Motor - Discharge Observations: Decreased weakness/coordination on L LE (has increased since evaluation)  Mobility Bed Mobility Bed Mobility: Rolling Right;Rolling Left;Supine to Sit Rolling Right: Independent with assistive device Rolling Left: Independent with assistive device Right Sidelying to Sit: Independent with assistive device Supine to Sit: Independent with assistive device Sit to Sidelying Right: Independent with assistive device Transfers Transfers: Sit to Stand;Stand to Sit Sit to Stand: Independent with assistive device Stand to Sit: Independent with assistive device Stand Pivot Transfers: Independent with assistive device Transfer (Assistive device): Rolling walker Locomotion  Gait Ambulation: Yes Gait Assistance: Independent with assistive device Gait Distance (Feet): 300 Feet Assistive device: Rolling walker Gait Gait: Yes Gait Pattern: Poor foot clearance - left;Poor foot clearance - right;Decreased hip/knee flexion - left;Decreased dorsiflexion - left Gait velocity: decreased Stairs / Additional Locomotion Stairs: Yes Stairs Assistance: Independent with assistive device Stair Management Technique: Two rails Number of Stairs: 12 Height of Stairs: 6 Ramp: Independent with assistive device Curb: Independent with assistive Paediatric nurse Mobility: Yes Wheelchair Assistance: Independent with Scientist, research (life sciences): Both upper extremities;Both lower extermities Distance: 150  Trunk/Postural Assessment   Cervical Assessment Cervical Assessment: Within Functional Limits Thoracic Assessment Thoracic Assessment: Within Functional Limits Lumbar Assessment Lumbar Assessment: Within Functional Limits Postural Control Postural Control: Deficits on evaluation Righting Reactions: delayed Protective Responses: delayed  Balance Balance Balance Assessed: Yes Standardized Balance Assessment Standardized Balance Assessment: Berg Balance Test;Timed Up and Go Test;Functional Gait Assessment Berg Balance Test Sit to Stand:  Able to stand without using hands and stabilize independently Standing Unsupported: Able to stand 2 minutes with supervision Sitting with Back Unsupported but Feet Supported on Floor or Stool: Able to sit safely and securely 2 minutes Stand to Sit: Sits safely with minimal use of hands Transfers: Able to transfer safely, minor use of hands Standing Unsupported with Eyes Closed: Able to stand 10 seconds with supervision Standing Ubsupported with Feet Together: Able to place feet together independently and stand for 1 minute with supervision From Standing, Reach Forward with Outstretched Arm: Can reach confidently >25 cm (10") From Standing Position, Pick up Object from Floor: Able to pick up shoe, needs supervision From Standing Position, Turn to Look Behind Over each Shoulder: Looks behind from both sides and weight shifts well Turn 360 Degrees: Able to turn 360 degrees safely but slowly Standing Unsupported, Alternately Place Feet on Step/Stool: Able to complete >2 steps/needs minimal assist Standing Unsupported, One Foot in Front: Needs help to step but can hold 15 seconds Standing on One Leg: Unable to try or needs assist to prevent fall Total Score: 40 Timed Up and Go Test TUG: Normal TUG Normal TUG (seconds): 5 Dynamic Sitting Balance Dynamic Sitting - Balance Support: During functional activity Dynamic Sitting - Level of Assistance: 5: Stand by assistance Static  Standing Balance Static Standing - Balance Support: During functional activity Static Standing - Level of Assistance: 5: Stand by assistance Dynamic Standing Balance Dynamic Standing - Balance Support: During functional activity Dynamic Standing - Level of Assistance: 5: Stand by assistance Dynamic Standing - Comments: With RW Functional Gait  Assessment Gait assessed : Yes Gait Level Surface: Walks 20 ft, slow speed, abnormal gait pattern, evidence for imbalance or deviates 10-15 in outside of the 12 in walkway width. Requires more than 7 sec to ambulate 20 ft. Change in Gait Speed: Able to change speed, demonstrates mild gait deviations, deviates 6-10 in outside of the 12 in walkway width, or no gait deviations, unable to achieve a major change in velocity, or uses a change in velocity, or uses an assistive device. Gait with Horizontal Head Turns: Performs head turns with moderate changes in gait velocity, slows down, deviates 10-15 in outside 12 in walkway width but recovers, can continue to walk. Gait with Vertical Head Turns: Performs task with moderate change in gait velocity, slows down, deviates 10-15 in outside 12 in walkway width but recovers, can continue to walk. Gait and Pivot Turn: Turns slowly, requires verbal cueing, or requires several small steps to catch balance following turn and stop Step Over Obstacle: Is able to step over one shoe box (4.5 in total height) but must slow down and adjust steps to clear box safely. May require verbal cueing. Gait with Narrow Base of Support: Ambulates less than 4 steps heel to toe or cannot perform without assistance. Gait with Eyes Closed: Cannot walk 20 ft without assistance, severe gait deviations or imbalance, deviates greater than 15 in outside 12 in walkway width or will not attempt task. Ambulating Backwards: Walks 20 ft, slow speed, abnormal gait pattern, evidence for imbalance, deviates 10-15 in outside 12 in walkway width. Steps:  Alternating feet, must use rail. Total Score: 10 Extremity Assessment      RLE Assessment RLE Assessment: Within Functional Limits LLE Assessment LLE Assessment: Exceptions to [facility] LLE Strength Left Hip Flexion: 4/5 Left Hip Extension: 4/5 Left Hip ABduction: 4/5 Left Hip ADduction: 4+/5 Left Knee Flexion: 3/5 Left Knee Extension: 4/5 Left Ankle Dorsiflexion: 4/5 Left  Ankle Plantar Flexion: 4/5   Dominic Sandoval PTA 06/10/2023, 12:33 PM  Loel Dubonnet PT, DPT, CSRS 06/11/2023, 5:37 PM

## 2023-06-10 NOTE — Progress Notes (Signed)
Physical Therapy Session Note  Patient Details  Name: Bradley Morrison MRN: [id number] Date of Birth: 1976/04/22  Today's Date: 06/10/2023 PT Individual Time: 0910-1000; 1430 - 1530 PT Individual Time Calculation (min): 50 min; 60 min   Short Term Goals: Week 1:  PT Short Term Goal 1 (Week 1): STG=LTGs 2/2 ELOS  SESSION 1 Skilled Therapeutic Interventions/Progress Updates: Patient sitting in WC on entrance to room. Patient alert and agreeable to PT session.   Patient reported no pain. Today's session focused on grad day and updating outcome measures.  Therapeutic Activity: Transfers: Pt performed sit<>stand transfers throughout session with RW with modI (No VC required), and supervision with no AD (VC for increased anterior weight shift).  Patient demonstrates increased fall risk as noted by score of  40 /56 on Berg Balance Scale.  (<36= high risk for falls, close to 100%; 37-45 significant >80%; 46-51 moderate >50%; 52-55 lower >25%) TUG = 25 seconds with RW Patient demonstrates increased fall risk as noted by score of 10/30 on  Functional Gait Assessment.   <22/30 = predictive of falls, <20/30 = fall in 6 months, <18/30 = predictive of falls in PD MCID: 5 points stroke population, 4 points geriatric population (ANPTA Core Set of Outcome Measures for Adults with Neurologic Conditions, 2018)  Gait Training:  Pt ambulated from room to main gym for therapy, and then back to room using RW with modI and with VC to recall control of L LE during eccentric portion of midstance to avoid hyperextension.   Patient sitting in WC at end of session with brakes locked, chair alarm set, and all needs within reach.  SESSION 1 Skilled Therapeutic Interventions/Progress Updates: Patient sitting in chair following OT session with mother present for family education. PTA spent beginning of session building rapport with mother as this was the first time meeting her. Pt mother only big concern were the 5  stairs at home with HR on L side, and how to navigate with RW and transferring into pt's fathers truck that has a 3" lift. Pt ambulated to ortho gym with RW and with modI (VC to control eccentric midstance of quadriceps) and education provided to mother on pt's ambulatory deficits (foot drag on L, genu recurvatum, on L LE and decreased balance)  and progress since admission to inpatient rehab. Pt transferred to 24" height to simulate rough height of father's truck. Pt demos safe transfer with no LOB and ambulated to main gym for stair navigation with RW. PTA demonstrated sequence with RW on R UE, and L UE on LHR. Pt performed task and only provided with VC to ensure that RW does not drop below the step of B LE as to avoid excess anterior weight shift when descending. Pt with no LOB and demos safe navigation with mother feeling better about stairs at home. Pt educated on stroke prevention with increasing exercise endurance as pt progresses functionally with PT. Pt ambulated back to room in RW and PTA suggested to ask the PA at d/c and LCSW covering about any further questions on what to expect after returning home. Pt sitting in chair and made modI with mother present and all needs in reach.   Therapy Documentation Precautions:  Precautions Precautions: Fall Precaution Comments: Mild L hemi Restrictions Weight Bearing Restrictions: No  Therapy/Group: Individual Therapy  Baden Betsch PTA 06/10/2023, 3:54 PM

## 2023-06-10 NOTE — Progress Notes (Signed)
Occupational Therapy Discharge Summary  Patient Details  Name: Bradley Morrison MRN: [id number] Date of Birth: Sep 17, 1976  Date of Discharge from OT service:June 10, 2023  Patient has met 8 of 8 long term goals due to improved activity tolerance, improved balance, ability to compensate for deficits, and improved coordination.  Patient to discharge at overall Modified Independent level.  Patient's care partner is independent to provide the necessary physical and cognitive assistance at discharge with higher level tasks of cooking, cleaning, taking out garbage.  Reasons goals not met: n/a  Recommendation:  Patient will benefit from ongoing skilled OT services in home health setting to continue to advance functional skills in the area of iADL and Vocation.  Equipment: No equipment provided  Reasons for discharge: treatment goals met  Patient/family agrees with progress made and goals achieved: Yes  OT Discharge Precautions/Restrictions   fall   ADL ADL Eating: Independent Grooming: Setup Upper Body Bathing: Independent Where Assessed-Upper Body Bathing: Shower Lower Body Bathing: Modified independent Where Assessed-Lower Body Bathing: Shower Upper Body Dressing: Independent Where Assessed-Upper Body Dressing: Edge of bed Lower Body Dressing: Modified independent Where Assessed-Lower Body Dressing: Edge of bed Toileting: Modified independent Where Assessed-Toileting: Teacher, adult education: Engineer, agricultural Method: Insurance claims handler: Modified independent Web designer Method: Ship broker: Insurance underwriter: Modified independent Film/video editor Method: Designer, industrial/product: Information systems manager with back Vision Baseline Vision/History: 1 Wears glasses Patient Visual Report: No change from baseline Vision Assessment?: No apparent visual deficits Perception  Perception:  Within Functional Limits Praxis Praxis: WFL Cognition Cognition Arousal/Alertness: Awake/alert Orientation Level: Person;Place;Situation Person: Oriented Place: Oriented Situation: Oriented Brief Interview for Mental Status (BIMS) Repetition of Three Words (First Attempt): 3 Temporal Orientation: Year: Correct Temporal Orientation: Month: Accurate within 5 days Temporal Orientation: Day: Correct Recall: "Sock": Yes, no cue required Recall: "Blue": Yes, no cue required Recall: "Bed": Yes, no cue required BIMS Summary Score: 15 Sensation Sensation Light Touch: Appears Intact Hot/Cold: Appears Intact Proprioception: Appears Intact Stereognosis: Appears Intact Coordination Gross Motor Movements are Fluid and Coordinated: No Fine Motor Movements are Fluid and Coordinated: Yes Finger Nose Finger Test: Slightly off at first, then able to correct after a few tries to tip of finger and nose Heel Shin Test: able to move less than a quarter up lower leg Motor  Motor Motor - Discharge Observations: Decreased weakness/coordination on L LE (has increased since evaluation) Mobility  Transfers Sit to Stand: Independent Stand to Sit: Independent  Trunk/Postural Assessment  Postural Control Postural Control: Deficits on evaluation Righting Reactions: delayed Protective Responses: delayed  Balance Dynamic Sitting Balance Dynamic Sitting - Level of Assistance: 7: Independent Static Standing Balance Static Standing - Level of Assistance: 7: Independent Dynamic Standing Balance Dynamic Standing - Level of Assistance: 6: Modified independent (Device/Increase time) Dynamic Standing - Comments: with RW Extremity/Trunk Assessment RUE Assessment RUE Assessment: Within Functional Limits LUE Assessment LUE Assessment: Within Functional Limits   Karron Alvizo 06/10/2023, 3:12 PM

## 2023-06-10 NOTE — Progress Notes (Signed)
Speech Language Pathology Discharge Summary  Patient Details  Name: Bradley Morrison MRN: [id number] Date of Birth: February 03, 1976  Date of Discharge from SLP service:June 10, 2023  Today's Date: 06/10/2023 SLP Individual Time: 1300-1345 SLP Individual Time Calculation (min): 45 min   Skilled Therapeutic Interventions:   Skilled therapy session focused on re-evaluation of cognitive skills and family education. SLP provided education to patients mother regarding patients current level of cognitive functioning and reviewed strategies discussed in speech therapy (memory, swallowing, speech intelligibility). Mother verbalized understanding. SLP re-administered the Cognistat to assess for cognitive change since admission. This date, patient scored WFL on all subtest's with the exception of memory. Patient continues to present with mild deficits in short term memory, however benefits from supervision A. Reviewed progress and standardized assessment outcomes with patient and family. Patient left in bed with alarm set and call bell in reach.    Patient has met 4 of 4 long term goals.  Patient to discharge at overall Modified Independent;Supervision level.  Reasons goals not met: n/a   Clinical Impression/Discharge Summary:  Pt has made excellent progress and has met 4/4 LTG's this admission due to improved speech intelligibility, cognition and dysphagia. Pt is currently an overall supervision A for cognitive tasks and requires mod I cues for utilization of swallowing compensatory strategies (slow rate, small bites/sips, chin tuck, lingual/finger sweep) to maximize overt s/sx of aspiration with dys 3 diet. Pt/family education complete and patient will d/c home with family and 24 hour supervision. Pt would benefit from f/u [facility] SLP to maximize high level cognition in order to maximize functional independence.   Care Partner:  Caregiver Able to Provide Assistance: Yes  Type of Caregiver Assistance:  Physical;Cognitive  Recommendation:  Home Health SLP  Rationale for SLP Follow Up: Maximize cognitive function and independence   Equipment: n/a   Reasons for discharge: Discharged from hospital;Treatment goals met   Patient/Family Agrees with Progress Made and Goals Achieved: Yes    Fatma Rutten M.A., CF-SLP 06/10/2023, 3:34 PM

## 2023-06-10 NOTE — Progress Notes (Addendum)
PROGRESS NOTE   Subjective/Complaints:  No issues overnite  " I can move my toes this am !" Discussed being a peer counselor   ROS: Patient denies CP, SOB, N/V/D  Objective:   No results found. No results for input(s): "WBC", "HGB", "HCT", "PLT" in the last 72 hours.   No results for input(s): "NA", "K", "CL", "CO2", "GLUCOSE", "BUN", "CREATININE", "CALCIUM" in the last 72 hours.    Intake/Output Summary (Last 24 hours) at 06/10/2023 0759 Last data filed at 06/10/2023 0738 Gross per 24 hour  Intake 358 ml  Output 500 ml  Net -142 ml        Physical Exam: Vital Signs Blood pressure 119/82, pulse 81, temperature 98.4 F (36.9 C), temperature source Oral, resp. rate 16, height 5\' 6"  (1.676 m), weight 90.9 kg, SpO2 96%.   General: No acute distress Mood and affect are appropriate Heart: Regular rate and rhythm no rubs murmurs or extra sounds Lungs: Clear to auscultation, breathing unlabored, no rales or wheezes Abdomen: Positive bowel sounds, soft nontender to palpation, nondistended Extremities: No clubbing, cyanosis, or edema  Skin: No evidence of breakdown, no evidence of rash Neurologic: Cranial nerves II through XII intact except for left facial weakness--speech slightly slurred, motor strength is 5/5 in bilateral deltoid, bicep, tricep, grip, 5/5 RIght and 3- left hip flexor, knee extensors, ankle dorsiflexor and plantar flexor.Now with Left toe flex/ext and abduction ! Good sitting balance. Sensory exam normal sensation to light touch and proprioception in bilateral upper and lower extremities  Musculoskeletal: Full range of motion in all 4 extremities. No joint swelling ,   Assessment/Plan: 1. Functional deficits which require 3+ hours per day of interdisciplinary therapy in a comprehensive inpatient rehab setting. Physiatrist is providing close team supervision and 24 hour management of active medical  problems listed below. Physiatrist and rehab team continue to assess barriers to discharge/monitor patient progress toward functional and medical goals  Care Tool:  Bathing    Body parts bathed by patient: Right arm, Left arm, Chest, Abdomen, Front perineal area, Buttocks, Right upper leg, Left upper leg, Right lower leg, Left lower leg, Face         Bathing assist Assist Level: Supervision/Verbal cueing     Upper Body Dressing/Undressing Upper body dressing   What is the patient wearing?: Pull over shirt    Upper body assist Assist Level: Set up assist    Lower Body Dressing/Undressing Lower body dressing      What is the patient wearing?: Underwear/pull up, Pants     Lower body assist Assist for lower body dressing: Supervision/Verbal cueing     Toileting Toileting    Toileting assist Assist for toileting: Contact Guard/Touching assist     Transfers Chair/bed transfer  Transfers assist     Chair/bed transfer assist level: Supervision/Verbal cueing     Locomotion Ambulation   Ambulation assist      Assist level: Contact Guard/Touching assist Assistive device: Walker-rolling Max distance: 90   Walk 10 feet activity   Assist     Assist level: Minimal Assistance - Patient > 75% Assistive device: Walker-rolling   Walk 50 feet activity   Assist  Assist level: Minimal Assistance - Patient > 75% Assistive device: Walker-rolling    Walk 150 feet activity   Assist Walk 150 feet activity did not occur: Safety/medical concerns         Walk 10 feet on uneven surface  activity   Assist     Assist level: Minimal Assistance - Patient > 75% Assistive device: Walker-rolling   Wheelchair     Assist Is the patient using a wheelchair?: Yes Type of Wheelchair: Manual    Wheelchair assist level: Supervision/Verbal cueing Max wheelchair distance: 150+    Wheelchair 50 feet with 2 turns activity    Assist        Assist  Level: Supervision/Verbal cueing   Wheelchair 150 feet activity     Assist      Assist Level: Supervision/Verbal cueing   Blood pressure 119/82, pulse 81, temperature 98.4 F (36.9 C), temperature source Oral, resp. rate 16, height 5\' 6"  (1.676 m), weight 90.9 kg, SpO2 96%.  Medical Problem List and Plan: 1. 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              -patient may shower             -ELOS/Goals: 8/23, 24/7S             -Continue CIR therapies including PT, OT, and SLP    2.  Antithrombotics: -DVT/anticoagulation:  Pharmaceutical: Lovenox             -antiplatelet therapy: DAPT X 3 weeks followed by ASA alone.  3. Diabetic neuropathy/Pain Management:  Continue gabapentin tid 4. Mood/Behavior/Sleep:  LCSW to follow for evaluation and support             --trazodone prn. Pt would like to keep it PRN  -will limit interruptions overnight             -antipsychotic agents: N/A 5. Neuropsych/cognition: This patient is not capable of making decisions on his own behalf. 6. Skin/Wound Care: Routine pressure relief measures.  7. Fluids/Electrolytes/Nutrition: Monitor I/O  -Encourage PO  -I personally reviewed the patient's labs today.   8. HTN: Monitor BP TID- continue to hold amlodipine and hyzaar            Vitals:   06/10/23 0628 06/10/23 0756  BP: 119/82   Pulse: 81   Resp: 16   Temp: 98.4 F (36.9 C)   SpO2: 93% 96%  Reasonable control thus far 8/22  -will add magnesium gluconate 250mg  HS. 9. T2DM: Better control with Hgb A1C-8.5( 02/24)-->6.5 @ admission. Monitor BS ac/hs             --SSI for elevated BS             --On farxiga daily CBG (last 3)  Recent Labs    06/08/23 0547 06/09/23 0559  GLUCAP 162* 118*  Controlled 8/22, daily CBGs 10. Intermittent hypokalemia: Recheck in am. 11. Constipation: continue colace and miralax.  12. Tobacco/Marijuana use: Educate on importance of cessation.  13. COPD?: I PPD smoker  PTA. Started combivent 8/19- will schedule QID on 8/20- lungs sound better- reduce to BID    14. Screening for vitamin D deficiency: check vitamin D level tomorrow.  15.  Troch bursitis Left sportscreme and PT - no further c/os  LOS: 8 days A FACE TO FACE EVALUATION WAS PERFORMED  Erick Colace 06/10/2023, 7:59 AM

## 2023-06-10 NOTE — Plan of Care (Signed)

## 2023-06-11 ENCOUNTER — Other Ambulatory Visit (HOSPITAL_COMMUNITY): Payer: Self-pay

## 2023-06-11 LAB — GLUCOSE, CAPILLARY: Glucose-Capillary: 163 mg/dL — ABNORMAL HIGH (ref 70–99)

## 2023-06-11 MED ORDER — POLYETHYLENE GLYCOL 3350 17 GM/SCOOP PO POWD: 17.0000 g | Freq: Every day | ORAL | 0 refills | Status: AC

## 2023-06-11 MED ORDER — GABAPENTIN 300 MG PO CAPS: 300.0000 mg | ORAL_CAPSULE | Freq: Three times a day (TID) | ORAL | 0 refills | Status: AC

## 2023-06-11 MED ORDER — TRAZODONE HCL 50 MG PO TABS: 25.0000 mg | ORAL_TABLET | Freq: Every evening | ORAL | 0 refills | Status: AC | PRN

## 2023-06-11 MED ORDER — ASPIRIN 81 MG PO TBEC: 81.0000 mg | DELAYED_RELEASE_TABLET | Freq: Every day | ORAL | 0 refills | Status: AC

## 2023-06-11 MED ORDER — ATORVASTATIN CALCIUM 80 MG PO TABS: 80.0000 mg | ORAL_TABLET | Freq: Every day | ORAL | 0 refills | Status: AC

## 2023-06-11 MED ORDER — DOCUSATE SODIUM 100 MG PO CAPS: 200.0000 mg | ORAL_CAPSULE | Freq: Two times a day (BID) | ORAL | 0 refills | Status: AC

## 2023-06-11 MED ORDER — CLOPIDOGREL BISULFATE 75 MG PO TABS: 75.0000 mg | ORAL_TABLET | Freq: Every day | ORAL | 0 refills | Status: AC

## 2023-06-11 MED ORDER — MAGNESIUM OXIDE 400 MG PO TABS: 200.0000 mg | ORAL_TABLET | Freq: Every day | ORAL | 0 refills | Status: AC

## 2023-06-11 MED ORDER — TROLAMINE SALICYLATE 10 % EX CREA: TOPICAL_CREAM | Freq: Two times a day (BID) | CUTANEOUS | 0 refills | Status: AC | PRN

## 2023-06-11 MED ORDER — STIOLTO RESPIMAT 2.5-2.5 MCG/ACT IN AERS: 2.0000 | INHALATION_SPRAY | Freq: Every day | RESPIRATORY_TRACT | 0 refills | Status: AC

## 2023-06-11 MED ORDER — DAPAGLIFLOZIN PROPANEDIOL 10 MG PO TABS: 10.0000 mg | ORAL_TABLET | Freq: Every day | ORAL | 0 refills | Status: AC

## 2023-06-11 MED ORDER — METFORMIN HCL 500 MG PO TABS: 500.0000 mg | ORAL_TABLET | Freq: Every day | ORAL | 0 refills | Status: AC

## 2023-06-11 NOTE — Progress Notes (Signed)
Patient ID: Bradley Morrison, male   DOB: 02-22-1976, 47 y.o.   MRN: [id number]  INPATIENT REHABILITATION DISCHARGE NOTE   Discharge instructions by: Marissa Nestle, PA-C  Verbalized understanding: yes  Skin care/Wound care healing? CDI  Pain: 2/10 headache, 650 mg Tylenol given  IV's: n/a  Tubes/Drains: n/a  O2: n/a  Safety instructions: provided to patient and family  Patient belongings: returned to patient  Discharged to: home  Discharged via: car/parents

## 2023-06-11 NOTE — Progress Notes (Signed)
PROGRESS NOTE   Subjective/Complaints:  Excited about going home.  No new issues   ROS: Patient denies CP, SOB, N/V/D  Objective:   No results found. No results for input(s): "WBC", "HGB", "HCT", "PLT" in the last 72 hours.   No results for input(s): "NA", "K", "CL", "CO2", "GLUCOSE", "BUN", "CREATININE", "CALCIUM" in the last 72 hours.    Intake/Output Summary (Last 24 hours) at 06/11/2023 0919 Last data filed at 06/11/2023 0645 Gross per 24 hour  Intake 590 ml  Output 900 ml  Net -310 ml        Physical Exam: Vital Signs Blood pressure 124/86, pulse 78, temperature 98.3 F (36.8 C), resp. rate 18, height 5\' 6"  (1.676 m), weight 90.9 kg, SpO2 93%.   General: No acute distress Mood and affect are appropriate Heart: Regular rate and rhythm no rubs murmurs or extra sounds Lungs: Clear to auscultation, breathing unlabored, no rales or wheezes Abdomen: Positive bowel sounds, soft nontender to palpation, nondistended Extremities: No clubbing, cyanosis, or edema  Skin: No evidence of breakdown, no evidence of rash Neurologic: Cranial nerves II through XII intact except for left facial weakness--speech slightly slurred, motor strength is 5/5 in bilateral deltoid, bicep, tricep, grip, 5/5 RIght and 3- left hip flexor, knee extensors, ankle dorsiflexor and plantar flexor.Now with Left toe flex/ext and abduction ! Good sitting balance. Sensory exam normal sensation to light touch and proprioception in bilateral upper and lower extremities  Musculoskeletal: Full range of motion in all 4 extremities. No joint swelling ,   Assessment/Plan: 1. 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  Stable for D/C today F/u PCP in 3-4 weeks F/u PM&R 2 weeks See D/C summary See D/C instructions   Care Tool:  Bathing    Body parts bathed by patient: Right arm, Left arm, Chest, Abdomen, Front perineal area, Buttocks, Right upper  leg, Left upper leg, Right lower leg, Left lower leg, Face         Bathing assist Assist Level: Independent with assistive device     Upper Body Dressing/Undressing Upper body dressing   What is the patient wearing?: Pull over shirt    Upper body assist Assist Level: Independent    Lower Body Dressing/Undressing Lower body dressing      What is the patient wearing?: Underwear/pull up, Pants     Lower body assist Assist for lower body dressing: Independent with assitive device     Toileting Toileting    Toileting assist Assist for toileting: Independent with assistive device     Transfers Chair/bed transfer  Transfers assist     Chair/bed transfer assist level: Independent Chair/bed transfer assistive device: Walker, Other (rollator)   Locomotion Ambulation   Ambulation assist      Assist level: Independent with assistive device Assistive device: Walker-rolling Max distance: 300   Walk 10 feet activity   Assist     Assist level: Independent with assistive device Assistive device: Walker-rolling   Walk 50 feet activity   Assist    Assist level: Independent with assistive device Assistive device: Walker-rolling    Walk 150 feet activity   Assist Walk 150 feet activity did not occur: Safety/medical concerns  Assist level: Independent with assistive device Assistive device: Walker-rolling    Walk 10 feet on uneven surface  activity   Assist     Assist level: Independent with assistive device Assistive device: Walker-rolling   Wheelchair     Assist Is the patient using a wheelchair?: No Type of Wheelchair: Manual    Wheelchair assist level: Supervision/Verbal cueing Max wheelchair distance: 150+    Wheelchair 50 feet with 2 turns activity    Assist        Assist Level: Supervision/Verbal cueing   Wheelchair 150 feet activity     Assist      Assist Level: Supervision/Verbal cueing   Blood pressure  124/86, pulse 78, temperature 98.3 F (36.8 C), resp. rate 18, height 5\' 6"  (1.676 m), weight 90.9 kg, SpO2 93%.  Medical Problem List and Plan: 1. 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              -patient may shower             -ELOS/Goals: 8/23, 24/7S             -Continue CIR therapies including PT, OT, and SLP    2.  Antithrombotics: -DVT/anticoagulation:  Pharmaceutical: Lovenox             -antiplatelet therapy: DAPT X 3 weeks followed by ASA alone.  3. Diabetic neuropathy/Pain Management:  Continue gabapentin tid 4. Mood/Behavior/Sleep:  LCSW to follow for evaluation and support             --trazodone prn. Pt would like to keep it PRN  -will limit interruptions overnight             -antipsychotic agents: N/A 5. Neuropsych/cognition: This patient is not capable of making decisions on his own behalf. 6. Skin/Wound Care: Routine pressure relief measures.  7. Fluids/Electrolytes/Nutrition: Monitor I/O  -Encourage PO  -I personally reviewed the patient's labs today.   8. HTN: Monitor BP TID- continue to hold amlodipine and hyzaar            Vitals:   06/10/23 1953 06/11/23 0457  BP: (!) 142/95 124/86  Pulse: 78 78  Resp: 18 18  Temp: 98.7 F (37.1 C) 98.3 F (36.8 C)  SpO2: 98% 93%  Controlled 8/23  -will add magnesium gluconate 250mg  HS. 9. T2DM: Better control with Hgb A1C-8.5( 02/24)-->6.5 @ admission. Monitor BS ac/hs             --SSI for elevated BS             --On farxiga daily CBG (last 3)  Recent Labs    06/09/23 0559 06/10/23 1118 06/11/23 0620  GLUCAP 118* 129* 163*  Controlled 8/23  daily CBGs 10. Intermittent hypokalemia: Recheck in am. 11. Constipation: continue colace and miralax.  12. Tobacco/Marijuana use: Educate on importance of cessation.  13. COPD?: I PPD smoker PTA. Started combivent 8/19- will schedule QID on 8/20- lungs sound better- reduce to BID    14. Screening for vitamin D deficiency: check  vitamin D level tomorrow.  15.  Troch bursitis Left sportscreme and PT - no further c/os  LOS: 9 days A FACE TO FACE EVALUATION WAS PERFORMED  Erick Colace 06/11/2023, 9:19 AM

## 2023-06-11 NOTE — Progress Notes (Signed)
Inpatient Rehabilitation Care Coordinator Discharge Note   Patient Details  Name: Bradley Morrison MRN: [id number] Date of Birth: 05-19-1976   Discharge location: D/c to home with his mother  Length of Stay: 8 days  Discharge activity level: Mod I  Home/community participation: Limited  Patient response RJ:JOACZY Literacy - How often do you need to have someone help you when you read instructions, pamphlets, or other written material from your doctor or pharmacy?: Never  Patient response SA:YTKZSW Isolation - How often do you feel lonely or isolated from those around you?: Never  Services provided included: MD, RD, PT, OT, SLP, RN, CM, TR, Neuropsych, SW, Pharmacy  Financial Services:  Field seismologist Utilized: Private Insurance [employer]  Choices offered to/list presented to: patient and pt mother  Follow-up services arranged:  Home Health, DME Home Health Agency: [facility] for HHPT/OT/SLP/aide    DME : has RW and TTB    Patient response to transportation need: Is the patient able to respond to transportation needs?: Yes In the past 12 months, has lack of transportation kept you from medical appointments or from getting medications?: No In the past 12 months, has lack of transportation kept you from meetings, work, or from getting things needed for daily living?: No   Patient/Family verbalized understanding of follow-up arrangements:  Yes  Individual responsible for coordination of the follow-up plan: contact pt mother Cheryl#[phone]  Confirmed correct DME delivered: Gretchen Short 06/11/2023    Comments (or additional information):fam edu completed on 8/22  Summary of Stay    Date/Time Discharge Planning CSW  06/08/23 1303 Discharghing home with mother in [place], [place]. 5 steps to enter the home. Mother works and unable to provide 24/7 supervision. CJB       Young Mulvey A Lula Olszewski

## 2023-06-11 NOTE — Progress Notes (Signed)
Inpatient Rehabilitation Discharge Medication Review by a Pharmacist  A complete drug regimen review was completed for this patient to identify any potential clinically significant medication issues.  High Risk Drug Classes Is patient taking? Indication by Medication  Antipsychotic No   Anticoagulant No   Antibiotic No   Opioid No   Antiplatelet Yes Clopidogrel, aspirin - CVA  Hypoglycemics/insulin Yes Dapagliflozin, Metformin- DM  Vasoactive Medication No   Chemotherapy No   Other Yes Gabapentin - Pain Stiolto - COPD Atorvastatin - HLD     Type of Medication Issue Identified Description of Issue Recommendation(s)  Drug Interaction(s) (clinically significant)     Duplicate Therapy     Allergy     No Medication Administration End Date     Incorrect Dose     Additional Drug Therapy Needed     Significant med changes from prior encounter (inform family/care partners about these prior to discharge).    Other       Clinically significant medication issues were identified that warrant physician communication and completion of prescribed/recommended actions by midnight of the next day:  No  Name of provider notified for urgent issues identified:   Provider Method of Notification:     Pharmacist comments: None  Time spent performing this drug regimen review (minutes):  20 minutes  Okey Regal, PharmD

## 2023-06-15 ENCOUNTER — Telehealth: Payer: Self-pay | Admitting: *Deleted

## 2023-06-15 NOTE — Telephone Encounter (Addendum)
Transition Care Management Unsuccessful Follow-up Telephone Call  Date of discharge and from where:  INPT REHAB @[facility]  [facility] 06/11/23  Attempts:  1st Attempt  Reason for unsuccessful TCM follow-up call:  Left voice message

## 2023-06-18 NOTE — Telephone Encounter (Signed)
Transitional Care call--I spoke with his mother Bradley Morrison.    Are you/is patient experiencing any problems since coming home? Are there any questions regarding any aspect of care? NO Are there any questions regarding medications administration/dosing? Are meds being taken as prescribed? Patient should review meds with caller to confirm YES THEY HAVE ALL MEDICATIONS. ONLY RECEIVED 8 DAYS OF PLAVIX AND WAS THINKING THAT WAS ALL HE HAD TO TAKE. THERE IS NO NOTE TO THAT AFFECT. I HAVE SENT A MESSAGE TO PAMELA LOVE PA FOR MORE INFORMATION. HE HAS SEEN PCP BUT DOESN'T SEE [name] UNTIL 09/01/23. PCP STARTED JARDIANCE AND STOPPED METFORMIN AND FARXIGA.  Pam Love said he is to stop the plavix after the initial rx completes and will not continue. Have there been any falls? NO Has Home Health been to the house and/or have they contacted you? If not, have you tried to contact them? Can we help you contact them? YES HAS SEEN PT AND OT BUT SPEECH IS DELAYED DUE TO SCHEDULING. OT IS GOING TO DEFER THEIR APPTS TO SPEECHE SINCE HE NEEDS THAT MORE Are bowels and bladder emptying properly? Are there any unexpected incontinence issues? If applicable, is patient following bowel/bladder programs? NO Any fevers, problems with breathing, unexpected pain? NO Are there any skin problems or new areas of breakdown? NO Has the patient/family member arranged specialty MD follow up (ie cardiology/neurology/renal/surgical/etc)?  Can we help arrange?YES THEY HAVE SEEN PCP, WILL SEE OUR OFFICE 06/25/23 AND [name]  09/01/23 Does the patient need any other services or support that we can help arrange? NO Are caregivers following through as expected in assisting the patient? YES Has the patient quit smoking, drinking alcohol, or using drugs as recommended? YES  Appointment FRIDAY 06/25/23 @11 :00 ARRIVE BY 10:20 TO SEE Bradley THOMAS NP THEN TO [name] THEREAFTER. PACKET WAS MAILED TO HIS HOME BUT HE IS STAYING WITH HIS MOTHER. SHE  KNOWS FOR HIM TO ARRIVE EARLY FOR APPT. ADDRESS REVIEWED. [address] [phone]

## 2023-06-25 ENCOUNTER — Encounter: Payer: Medicare HMO | Attending: Registered Nurse | Admitting: Registered Nurse

## 2023-06-25 ENCOUNTER — Encounter: Payer: Self-pay | Admitting: Registered Nurse

## 2023-06-25 VITALS — BP 122/81 | HR 70 | Ht 66.0 in | Wt 204.0 lb

## 2023-06-25 DIAGNOSIS — I6350 Cerebral infarction due to unspecified occlusion or stenosis of unspecified cerebral artery: Secondary | ICD-10-CM | POA: Insufficient documentation

## 2023-06-25 DIAGNOSIS — F1210 Cannabis abuse, uncomplicated: Secondary | ICD-10-CM | POA: Diagnosis present

## 2023-06-25 DIAGNOSIS — I10 Essential (primary) hypertension: Secondary | ICD-10-CM | POA: Insufficient documentation

## 2023-06-25 DIAGNOSIS — F17200 Nicotine dependence, unspecified, uncomplicated: Secondary | ICD-10-CM | POA: Insufficient documentation

## 2023-06-25 DIAGNOSIS — E785 Hyperlipidemia, unspecified: Secondary | ICD-10-CM | POA: Insufficient documentation

## 2023-06-25 DIAGNOSIS — E1142 Type 2 diabetes mellitus with diabetic polyneuropathy: Secondary | ICD-10-CM | POA: Diagnosis present

## 2023-06-25 DIAGNOSIS — Z794 Long term (current) use of insulin: Secondary | ICD-10-CM

## 2023-06-25 NOTE — Progress Notes (Signed)
Subjective:    Patient ID: Bradley Morrison, male    DOB: [birth year], 47 y.o.   MRN: [id number]  HPI: Bradley Morrison is a 47 y.o. male who is here or Transitional Care Visit for follow up of his  Cerebrovascular accident of pontine structure, Essential Hypertension, Dyslipidemia. Type 2 Diabetes Mellitus with peripheral neuropathy,Tobacco Use disorder and Marijuana Abuse.Marland Kitchen He presented to [facility] on 05/27/2023 with complaints of right facial droop and worsening dysarthria. He was admitted to [facility] on 05/18/2023- 05/20/2023 with Right Pontine Ischemic Stroke, discharge summary was reviewed.   Dr. Arville Care  [facility] H&P: on 05/28/2023.  Bradley Morrison is a 47 y.o. 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  He had headache without dizziness or blurred vision.  No tinnitus or vertigo.  No urinary or stool incontinence.  No weakness seizures.  No chest pain or palpitations.  No fever or chills.  No nausea or vomiting or abdominal pain.  No dysuria, oliguria, urinary frequency or urgency or flank pain.   CT Head: WO Contrast IMPRESSION: 1. No acute intracranial abnormality. 2. ASPECTS is 10.  CTA:  IMPRESSION: 1. No emergent large vessel occlusion or high-grade stenosis of the intracranial arteries. 2. Normal CT perfusion.  MR Brain: WO Contrast: 05/27/2023 IMPRESSION: Multifocal acute ischemia within the ventral pons. No hemorrhage or mass effect.  MR Brain: 05/30/2023  IMPRESSION: 1. Bilateral lower pontine abnormal diffusion, now more intense on the left side versus two days ago. Increased T2 hyperintensity with no hemorrhage or mass effect. No new areas of involvement. Small-vessel ischemic etiology is possible. Other etiologies of symmetric brain diffusion  abnormality were considered (such as toxic exposure or myelinosis), but seem unlikely given the pattern here.   2. New acute intracranial abnormality. And otherwise mild nonspecific cerebral white matter signal changes.  Neurology consulted and recommended DAPT X 3 Weeks followed by aspirin alone.   Bradley Morrison was admitted to inpatient rehabilitation on 06/02/2023 and discharged home on 06/10/2023. He is receiving Home Health Therapy with [facility]. He denies any pain. He rates his pain 0. Also reports he has a good appetite.    Pain Inventory Average Pain 0 Pain Right Now 0 My pain is  None  LOCATION OF PAIN  4  BOWEL Number of stools per week: Miralax   BLADDER Normal Frequent urination Yes     Mobility use a cane use a walker ability to climb steps?  yes do you drive?  no  Function disabled: date disabled . I need assistance with the following:  household duties  Neuro/Psych bladder control problems tremor trouble walking  Prior Studies Any changes since last visit?  no  Physicians involved in your care Any changes since last visit?  no   History reviewed. No pertinent family history. Social History   Socioeconomic History   Marital status: Legally Separated    Spouse name: Not on file   Number of children: Not on file   Years of education: Not on file   Highest education level: Not on file  Occupational History   Not on file  Tobacco Use   Smoking status: Every Day    Current packs/day: 1.00    Types: Cigarettes   Smokeless tobacco: Never  Vaping Use   Vaping status:  Never Used  Substance and Sexual Activity   Alcohol use: No   Drug use: No   Sexual activity: Not on file  Other Topics Concern   Not on file  Social History Narrative   Not on file   Social Determinants of Health   Financial Resource Strain: Low Risk  (05/19/2023)   Received from [facility]   Overall Financial Resource Strain (CARDIA)    Difficulty of  Paying Living Expenses: Not hard at all  Food Insecurity: No Food Insecurity (05/28/2023)   Hunger Vital Sign    Worried About Running Out of Food in the Last Year: Never true    Ran Out of Food in the Last Year: Never true  Transportation Needs: No Transportation Needs (05/28/2023)   PRAPARE - Administrator, Civil Service (Medical): No    Lack of Transportation (Non-Medical): No  Physical Activity: Not on file  Stress: Not on file  Social Connections: Not on file   History reviewed. No pertinent surgical history. Past Medical History:  Diagnosis Date   Anxiety    CP (cerebral palsy) (HCC)    Hypertension    Stroke (HCC) 05/18/2023   Ht 5\' 6"  (1.676 m)   BMI 32.33 kg/m   Opioid Risk Score:   Fall Risk Score:  `1  Depression screen PHQ 2/9      No data to display            Review of Systems  All other systems reviewed and are negative.     Objective:   Physical Exam Vitals and nursing note reviewed.  Constitutional:      Appearance: Normal appearance.  Cardiovascular:     Rate and Rhythm: Normal rate and regular rhythm.     Pulses: Normal pulses.     Heart sounds: Normal heart sounds.  Pulmonary:     Effort: Pulmonary effort is normal.     Breath sounds: Normal breath sounds.  Musculoskeletal:     Cervical back: Normal range of motion and neck supple.     Comments: Normal Muscle Bulk and Muscle Testing Reveals:  Upper Extremities: Full ROM and Muscle Strength  5/5  Lower Extremities: Full ROM and Muscle Strength 5/5 Arises from Table slowly using cane for support Narrow Based  Gait     Skin:    General: Skin is warm and dry.  Neurological:     Mental Status: He is alert and oriented to person, place, and time.  Psychiatric:        Mood and Affect: Mood normal.        Behavior: Behavior normal.         Assessment & Plan:  1.Cerebrovascular accident of pontine structure: Has a scheduled appointment with [name] Neurology, Continue  Outpatient Therapy with [facility]. Continue current medication regimen. Continue to Monitor.   2. Essential Hypertension: Not prescribe anti-hypertensive medication at this time: PCP following.  3. Dyslipidemia: Continue current medication: PCP Following continue to monitor.  .  4. Type 2 Diabetes Mellitus  with peripheral neuropathy: Continue current medication regimen. PCP Following. Continue to Monitor.  5. Tobacco Use disorder and Marijuana Abuse: Mr. Wanamaker reports he hasn't smoke cigarettes or marijuana since discharge. Continue to monitor.   F/U with [name] in 4-6 weeks

## 2023-07-08 ENCOUNTER — Encounter: Payer: Self-pay | Admitting: Neurology

## 2023-07-08 ENCOUNTER — Ambulatory Visit (INDEPENDENT_AMBULATORY_CARE_PROVIDER_SITE_OTHER): Payer: Medicare HMO | Admitting: Neurology

## 2023-07-08 VITALS — BP 149/86 | HR 60 | Ht 65.0 in | Wt 206.2 lb

## 2023-07-08 DIAGNOSIS — R29898 Other symptoms and signs involving the musculoskeletal system: Secondary | ICD-10-CM | POA: Diagnosis not present

## 2023-07-08 DIAGNOSIS — Z9189 Other specified personal risk factors, not elsewhere classified: Secondary | ICD-10-CM | POA: Diagnosis not present

## 2023-07-08 DIAGNOSIS — I6381 Other cerebral infarction due to occlusion or stenosis of small artery: Secondary | ICD-10-CM | POA: Diagnosis not present

## 2023-07-08 NOTE — Patient Instructions (Signed)
I had a long d/w patient about his recent Pontine lacunar stroke, risk for recurrent stroke/TIAs, personally independently reviewed imaging studies and stroke evaluation results and answered questions.Continue aspirin 81 mg daily  for secondary stroke prevention and maintain strict control of hypertension with blood pressure goal below 130/90, diabetes with hemoglobin A1c goal below 6.5% and lipids with LDL cholesterol goal below 70 mg/dL. I also advised the patient to eat a healthy diet with plenty of whole grains, cereals, fruits and vegetables, exercise regularly and maintain ideal body weight .referral for polysomnogram for evaluation for obstructive sleep apnea.  He was advised to use his cane at all times complemented on quitting smoking cigarettes and marijuana.  Followup in the future with my nurse practitioner in 3 months or call earlier if necessary.    Stroke Prevention Some medical conditions and behaviors can lead to a higher chance of having a stroke. You can help prevent a stroke by eating healthy, exercising, not smoking, and managing any medical conditions you have. Stroke is a leading cause of functional impairment. Primary prevention is particularly important because a majority of strokes are first-time events. Stroke changes the lives of not only those who experience a stroke but also their family and other caregivers. How can this condition affect me? A stroke is a medical emergency and should be treated right away. A stroke can lead to brain damage and can sometimes be life-threatening. If a person gets medical treatment right away, there is a better chance of surviving and recovering from a stroke. What can increase my risk? The following medical conditions may increase your risk of a stroke: Cardiovascular disease. High blood pressure (hypertension). Diabetes. High cholesterol. Sickle cell disease. Blood clotting disorders (hypercoagulable state). Obesity. Sleep disorders  (obstructive sleep apnea). Other risk factors include: Being older than age 93. Having a history of blood clots, stroke, or mini-stroke (transient ischemic attack, TIA). Genetic factors, such as race, ethnicity, or a family history of stroke. Smoking cigarettes or using other tobacco products. Taking birth control pills, especially if you also use tobacco. Heavy use of alcohol or drugs, especially cocaine and methamphetamine. Physical inactivity. What actions can I take to prevent this? Manage your health conditions High cholesterol levels. Eating a healthy diet is important for preventing high cholesterol. If cholesterol cannot be managed through diet alone, you may need to take medicines. Take any prescribed medicines to control your cholesterol as told by your health care provider. Hypertension. To reduce your risk of stroke, try to keep your blood pressure below 130/80. Eating a healthy diet and exercising regularly are important for controlling blood pressure. If these steps are not enough to manage your blood pressure, you may need to take medicines. Take any prescribed medicines to control hypertension as told by your health care provider. Ask your health care provider if you should monitor your blood pressure at home. Have your blood pressure checked every year, even if your blood pressure is normal. Blood pressure increases with age and some medical conditions. Diabetes. Eating a healthy diet and exercising regularly are important parts of managing your blood sugar (glucose). If your blood sugar cannot be managed through diet and exercise, you may need to take medicines. Take any prescribed medicines to control your diabetes as told by your health care provider. Get evaluated for obstructive sleep apnea. Talk to your health care provider about getting a sleep evaluation if you snore a lot or have excessive sleepiness. Make sure that any other medical conditions you  have, such as  atrial fibrillation or atherosclerosis, are managed. Nutrition Follow instructions from your health care provider about what to eat or drink to help manage your health condition. These instructions may include: Reducing your daily calorie intake. Limiting how much salt (sodium) you use to 1,500 milligrams (mg) each day. Using only healthy fats for cooking, such as olive oil, canola oil, or sunflower oil. Eating healthy foods. You can do this by: Choosing foods that are high in fiber, such as whole grains, and fresh fruits and vegetables. Eating at least 5 servings of fruits and vegetables a day. Try to fill one-half of your plate with fruits and vegetables at each meal. Choosing lean protein foods, such as lean cuts of meat, poultry without skin, fish, tofu, beans, and nuts. Eating low-fat dairy products. Avoiding foods that are high in sodium. This can help lower blood pressure. Avoiding foods that have saturated fat, trans fat, and cholesterol. This can help prevent high cholesterol. Avoiding processed and prepared foods. Counting your daily carbohydrate intake.  Lifestyle If you drink alcohol: Limit how much you have to: 0-1 drink a day for women who are not pregnant. 0-2 drinks a day for men. Know how much alcohol is in your drink. In the [place] one drink equals one 12 oz bottle of beer ( ), one 5 oz glass of wine ( ), or one 1 oz glass of hard liquor (44mL). Do not use any products that contain nicotine or tobacco. These products include cigarettes, chewing tobacco, and vaping devices, such as e-cigarettes. If you need help quitting, ask your health care provider. Avoid secondhand smoke. Do not use drugs. Activity  Try to stay at a healthy weight. Get at least 30 minutes of exercise on most days, such as: Fast walking. Biking. Swimming. Medicines Take over-the-counter and prescription medicines only as told by your health care provider. Aspirin or blood thinners  (antiplatelets or anticoagulants) may be recommended to reduce your risk of forming blood clots that can lead to stroke. Avoid taking birth control pills. Talk to your health care provider about the risks of taking birth control pills if: You are over 15 years old. You smoke. You get very bad headaches. You have had a blood clot. Where to find more information American Stroke Association: www.strokeassociation.org Get help right away if: You or a loved one has any symptoms of a stroke. "BE FAST" is an easy way to remember the main warning signs of a stroke: B - Balance. Signs are dizziness, sudden trouble walking, or loss of balance. E - Eyes. Signs are trouble seeing or a sudden change in vision. F - Face. Signs are sudden weakness or numbness of the face, or the face or eyelid drooping on one side. A - Arms. Signs are weakness or numbness in an arm. This happens suddenly and usually on one side of the body. S - Speech. Signs are sudden trouble speaking, slurred speech, or trouble understanding what people say. T - Time. Time to call emergency services. Write down what time symptoms started. You or a loved one has other signs of a stroke, such as: A sudden, severe headache with no known cause. Nausea or vomiting. Seizure. These symptoms may represent a serious problem that is an emergency. Do not wait to see if the symptoms will go away. Get medical help right away. Call your local emergency services (911 in the [place] Do not drive yourself to the hospital. Summary You can help to prevent a stroke by eating  healthy, exercising, not smoking, limiting alcohol intake, and managing any medical conditions you may have. Do not use any products that contain nicotine or tobacco. These include cigarettes, chewing tobacco, and vaping devices, such as e-cigarettes. If you need help quitting, ask your health care provider. Remember "BE FAST" for warning signs of a stroke. Get help right away if you or a  loved one has any of these signs. This information is not intended to replace advice given to you by your health care provider. Make sure you discuss any questions you have with your health care provider. Document Revised: 09/07/2022 Document Reviewed: 09/07/2022 Elsevier Patient Education  2024 [employer].

## 2023-07-08 NOTE — Progress Notes (Signed)
[facility] [address] [place]. [place] [address] [phone]       OFFICE CONSULT NOTE  Mr. RAMZEY FEDAK Date of Birth:  1976-08-21 Medical Record Number:  [id number]   Referring MD:  Delle Reining, PA-c  Reason for Referral:  Stroke  HPI: Mr. Macaskill is a 47 year old male with past medical history of diabetes, hypertension, anxiety, cerebral palsy who is seen today for initial office consultation visit for stroke.  History is obtained from him and his mother as well as review of electronic medical records as well as [facility] records in care everywhere.  I personally reviewed pertinent available imaging films lab work and hospital evaluation.  He was  initially admitted to [facility] in [place] on 05/19/2023 with sudden onset of dysarthria facial droop and mild left hemibody sensory loss and weakness.  He presented outside time window for thrombolysis.  MRI scan of the brain confirmed right pontine lacunar infarct.  Echocardiogram showed no significant cardiac source of embolism.  EKG and telemetry monitoring showed sinus rhythm.  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  LDL cholesterol was elevated and then 10 mg percent.  Patient was started on aspirin and Plavix and discharged home with outpatient therapy however before therapy could be started his symptoms got worse and he was admitted to [facility] on 05/28/2023 with increasing left-sided deficits.  Mri scan this time showed bilateral paramedian pontine lacunar infarcts and it was felt to be extension of his recent stroke.  LDL cholesterol was 64 mg percent and hemoglobin A1c was 6.5.  Hypercoagulable panel labs were negative and urine drug screen was positive for marijuana.  Patient is currently getting outpatient physical and Occupational Therapy and has finished speech therapy.  He is able to  walk with the cane now though he still feels left leg is slightly weak and he cannot trust it.  He has had no falls or injuries.  If states that he is learned his lesson and is turning his life around.  He has quit smoking cigarettes and marijuana as well as alcohol.  He still has some occasional word finding difficulties but speech is mostly improved.  He has some baseline walking difficulties due to cerebral palsy and a small left leg.  He is tolerating aspirin well without bruising or bleeding Lipitor without any side effects.  States his sugars under better control plans to have regular follow-up visits with his primary care physician.  ROS:   14 system review of systems is positive for leg weakness, difficulty walking, bruising and all other systems negative  PMH:  Past Medical History:  Diagnosis Date   Anxiety    CP (cerebral palsy) (HCC)    Hypertension    Stroke (HCC) 05/18/2023    Social History:  Social History   Socioeconomic History   Marital status: Legally Separated    Spouse name: Not on file   Number of children: Not on file   Years of education: Not on file   Highest education level: Not on file  Occupational History   Not on file  Tobacco Use   Smoking status: Every Day    Current packs/day: 1.00    Types: Cigarettes   Smokeless tobacco: Never  Vaping Use   Vaping status: Never Used  Substance and Sexual Activity   Alcohol use: No   Drug use: No  Sexual activity: Not on file  Other Topics Concern   Not on file  Social History Narrative   Not on file   Social Determinants of Health   Financial Resource Strain: Low Risk  (05/19/2023)   Received from [facility]   Overall Financial Resource Strain (CARDIA)    Difficulty of Paying Living Expenses: Not hard at all  Food Insecurity: No Food Insecurity (05/28/2023)   Hunger Vital Sign    Worried About Running Out of Food in the Last Year: Never true    Ran Out of Food in the Last Year: Never true   Transportation Needs: No Transportation Needs (05/28/2023)   PRAPARE - Administrator, Civil Service (Medical): No    Lack of Transportation (Non-Medical): No  Physical Activity: Not on file  Stress: Not on file  Social Connections: Not on file  Intimate Partner Violence: Not At Risk (05/28/2023)   Humiliation, Afraid, Rape, and Kick questionnaire    Fear of Current or Ex-Partner: No    Emotionally Abused: No    Physically Abused: No    Sexually Abused: No    Medications:   Current Outpatient Medications on File Prior to Visit  Medication Sig Dispense Refill   acetaminophen (TYLENOL) 325 MG tablet Take 1-2 tablets (325-650 mg total) by mouth every 4 (four) hours as needed for mild pain.     aspirin EC 81 MG tablet Take 1 tablet (81 mg total) by mouth daily. Swallow whole. 150 tablet 0   atorvastatin (LIPITOR) 80 MG tablet Take 1 tablet (80 mg total) by mouth daily. 30 tablet 0   docusate sodium (COLACE) 100 MG capsule Take 2 capsules (200 mg total) by mouth 2 (two) times daily. 300 capsule 0   empagliflozin (JARDIANCE) 10 MG TABS tablet Take 1 tablet by mouth daily.     gabapentin (NEURONTIN) 300 MG capsule Take 1 capsule (300 mg total) by mouth 3 (three) times daily. 90 capsule 0   gabapentin (NEURONTIN) 300 MG capsule Take by mouth.     magnesium oxide (MAG-OX) 400 MG tablet Take 0.5 tablets (200 mg total) by mouth at bedtime. 30 tablet 0   polyethylene glycol powder (GLYCOLAX/MIRALAX) 17 GM/SCOOP powder Take 17 g by mouth daily. 476 g 0   Tiotropium Bromide-Olodaterol (STIOLTO RESPIMAT) 2.5-2.5 MCG/ACT AERS Inhale 2 puffs into the lungs daily at 6 (six) AM. 4 g 0   traZODone (DESYREL) 50 MG tablet Take 0.5-1 tablets (25-50 mg total) by mouth at bedtime as needed for sleep. 30 tablet 0   trolamine salicylate (ASPERCREME) 10 % cream Apply topically 2 (two) times daily as needed for muscle pain. To left hip 85 g 0   clopidogrel (PLAVIX) 75 MG tablet Take 1 tablet (75 mg total)  by mouth daily. (Patient not taking: Reported on 07/08/2023) 8 tablet 0   dapagliflozin propanediol (FARXIGA) 10 MG TABS tablet Take 1 tablet (10 mg total) by mouth daily. (Patient not taking: Reported on 07/08/2023) 30 tablet 0   metFORMIN (GLUCOPHAGE) 500 MG tablet Take 1 tablet (500 mg total) by mouth daily with breakfast. (Patient not taking: Reported on 07/08/2023) 30 tablet 0   No current facility-administered medications on file prior to visit.    Allergies:   Allergies  Allergen Reactions   Zestril [Lisinopril] Swelling    Lip swelling    Physical Exam General: well developed, well nourished middle-aged male, seated, in no evident distress Head: head normocephalic and atraumatic.   Neck: supple with  no carotid or supraclavicular bruits Cardiovascular: regular rate and rhythm, no murmurs Musculoskeletal: no deformity Skin:  no rash/petichiae Vascular:  Normal pulses all extremities  Neurologic Exam Mental Status: Awake and fully alert. Oriented to place and time. Recent and remote memory intact. Attention span, concentration and fund of knowledge appropriate. Mood and affect appropriate.  Cranial Nerves: Fundoscopic exam reveals sharp disc margins. Pupils equal, briskly reactive to light. Extraocular movements full without nystagmus. Visual fields full to confrontation. Hearing intact. Facial sensation intact. Face, tongue, palate moves normally and symmetrically.  Motor: Normal bulk and tone. Normal strength in all tested extremity muscles.  Mild left hip flexor ankle dorsiflexor weakness.  Mild left lower extremity drift. Sensory.: intact to touch , pinprick , position and vibratory sensation.  Coordination: Rapid alternating movements normal in all extremities. Finger-to-nose and heel-to-shin performed accurately bilaterally. Gait and Station: Arises from chair without difficulty. Stance is normal. Gait demonstrates slight dragging of the left leg and stiffness and uses a cane...   Unable to heel, toe and tandem walk without difficulty.  Reflexes: 2+ and saymmetric and brisker on the left. Toes downgoing.   NIHSS  1 Modified Rankin  2   ASSESSMENT: 47 year old Caucasian male with bilateral paramedian pontine lacunar infarcts in July 2024 secondary to small vessel disease.  He is doing reasonably well with only mild residual left leg weakness.  Vascular risk factors of diabetes, hypertension, hyperlipidemia, obesity, cigarette and marijuana abuse and at risk for sleep apnea.     PLAN:I had a long d/w patient about his recent Pontine lacunar stroke, risk for recurrent stroke/TIAs, personally independently reviewed imaging studies and stroke evaluation results and answered questions.Continue aspirin 81 mg daily  for secondary stroke prevention and maintain strict control of hypertension with blood pressure goal below 130/90, diabetes with hemoglobin A1c goal below 6.5% and lipids with LDL cholesterol goal below 70 mg/dL. I also advised the patient to eat a healthy diet with plenty of whole grains, cereals, fruits and vegetables, exercise regularly and maintain ideal body weight .referral for polysomnogram for evaluation for obstructive sleep apnea.  He was advised to use his cane at all times complemented on quitting smoking cigarettes and marijuana.  Followup in the future with my nurse practitioner in 3 months or call earlier if necessary.   Greater than 50% time during this 45-minute consultation visit was spent on counseling and coordination of care of his lacunar stroke and discussion about secondary stroke prevention and answering questions. Delia Heady, MD Note: This document was prepared with digital dictation and possible smart phrase technology. Any transcriptional errors that result from this process are unintentional.

## 2023-07-20 ENCOUNTER — Encounter: Payer: Self-pay | Admitting: Physical Medicine & Rehabilitation

## 2023-07-27 ENCOUNTER — Encounter: Payer: Self-pay | Admitting: Physical Medicine & Rehabilitation

## 2023-07-27 ENCOUNTER — Encounter: Payer: Medicare HMO | Attending: Physical Medicine & Rehabilitation | Admitting: Physical Medicine & Rehabilitation

## 2023-07-27 VITALS — BP 144/83 | HR 77 | Ht 65.0 in | Wt 209.0 lb

## 2023-07-27 DIAGNOSIS — I6350 Cerebral infarction due to unspecified occlusion or stenosis of unspecified cerebral artery: Secondary | ICD-10-CM | POA: Diagnosis not present

## 2023-07-27 NOTE — Progress Notes (Signed)
Subjective:    Patient ID: Bradley Morrison, male    DOB: [birth year], 47 y.o.   MRN: [id number]  HPI Pain Inventory Av 47 y.o. 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 He was readmitted to [facility] on 05/28/23 with right facial droop and worsening of dysarthria.  CT head/neck was negative for LVO or high-grade stenosis.  UDS was positive for cannabinoids.  MRI brain showed multifocal acute ischemia ventral pons.  Dr. Jerrell Belfast feels that patient with acute and subacute pontine infarct 2 weeks prior to admission with expansion now involving the right pons as well and also small vessel etiology.  He recommended DAPT x 3 weeks followed by aspirin alone.     He did have worsening of symptoms on 08/11 with MRI showing abnormal diffusion more intense on the left with increased T2 intensity but no new areas of involvement.  Neurology felt that it was not uncommon for small vessel stroke to expand and get worse prior to starting to get better and to continue current management.  Swallow evaluation showed mild oral dysphagia and D2, thin liquids with chin tuck recommended. Therapy was working with patient who was limited by left-sided weakness with balance deficits and he required min to mod assist with ADLs.  CIR was recommended due to functional decline.    Admit date: 06/02/2023 Discharge date: 06/10/2023  Cleared by Neuro to drive Finished with [facility] therapy PT No longer needing walker  Still using straight cane does not feel that he can come off of this anytime soon.  He did have a mild limp due to his CP which affected both lower extremities.  He did not have to wear any type of brace on his ankles prior to his stroke. He is independent with all self-care and mobility denies any pain   average Pain 0 Pain Right Now 0 My pain is dull  In the last 24 hours, has pain interfered with the following? General activity 0 Relation with others  0 Enjoyment of life 0 What TIME of day is your pain at its worst? varies Sleep (in general) Fair  Pain is worse with: walking Pain improves with: rest Relief from Meds: 0  History reviewed. No pertinent family history. Social History   Socioeconomic History   Marital status: Legally Separated    Spouse name: Not on file   Number of children: Not on file   Years of education: Not on file   Highest education level: Not on file  Occupational History   Not on file  Tobacco Use   Smoking status: Every Day    Current packs/day: 1.00    Types: Cigarettes   Smokeless tobacco: Never  Vaping Use   Vaping status: Never Used  Substance and Sexual Activity   Alcohol use: No   Drug use: No   Sexual activity: Not on file  Other Topics Concern   Not on file  Social History Narrative   Not on file   Social Determinants of Health   Financial Resource Strain: Low Risk  (05/19/2023)   Received from [facility]   Overall Financial Resource Strain (CARDIA)    Difficulty of Paying Living Expenses: Not hard at all  Food Insecurity: No Food Insecurity (05/28/2023)   Hunger Vital Sign    Worried About Running Out of Food in the Last Year: Never true    Ran Out of Food in the Last Year:  Never true  Transportation Needs: No Transportation Needs (05/28/2023)   PRAPARE - Administrator, Civil Service (Medical): No    Lack of Transportation (Non-Medical): No  Physical Activity: Not on file  Stress: Not on file  Social Connections: Not on file   History reviewed. No pertinent surgical history. History reviewed. No pertinent surgical history. Past Medical History:  Diagnosis Date   Anxiety    CP (cerebral palsy) (HCC)    Hypertension    Stroke (HCC) 05/18/2023   Ht 5\' 5"  (1.651 m)   Wt 209 lb (94.8 kg)   BMI 34.78 kg/m   Opioid Risk Score:   Fall Risk Score:  `1  Depression screen [facility] 2/9     06/25/2023   10:23 AM  Depression screen PHQ 2/9  Decreased Interest 0   Down, Depressed, Hopeless 0  PHQ - 2 Score 0  Altered sleeping 3  Tired, decreased energy 2  Change in appetite 0  Feeling bad or failure about yourself  0  Trouble concentrating 0  Moving slowly or fidgety/restless 2  Suicidal thoughts 0  PHQ-9 Score 7  Difficult doing work/chores Not difficult at all      Review of Systems  Musculoskeletal:  Positive for gait problem.  All other systems reviewed and are negative.     Objective:   Physical Exam Vitals reviewed.  Constitutional:      Appearance: He is obese.  HENT:     Head: Normocephalic and atraumatic.  Eyes:     Extraocular Movements: Extraocular movements intact.     Conjunctiva/sclera: Conjunctivae normal.     Pupils: Pupils are equal, round, and reactive to light.  Musculoskeletal:     Right lower leg: No edema.     Left lower leg: No edema.  Skin:    General: Skin is warm and dry.  Neurological:     Mental Status: He is alert and oriented to person, place, and time.     Cranial Nerves: No dysarthria or facial asymmetry.     Sensory: Sensation is intact.     Motor: Abnormal muscle tone present.     Coordination: Coordination abnormal. Impaired rapid alternating movements.     Gait: Gait abnormal.     Comments: Mild foot drop on the left side he also tends to weight-bear on the lateral side of his feet bilaterally. Tone is mildly increased in the ankle plantar flexors. He has mildly decreased finger thumb opposition left upper extremity compared to the right side but much improved compared to his hospitalization.  Psychiatric:        Mood and Affect: Mood normal.        Behavior: Behavior normal.           Assessment & Plan:  1.  Ventral pontine infarct mild left hemiparesis largely improved.  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  He has left-sided mild foot drop but this can be corrected with cueing.  We discussed that he is still in the period of fairly rapid improvement poststroke about 2  months post.  Will send him to outpatient therapy at [facility].  He has been cleared to drive by neurology.  I will see him back in 6 weeks to follow-up on his progress

## 2023-07-31 ENCOUNTER — Emergency Department
Admission: EM | Admit: 2023-07-31 | Discharge: 2023-07-31 | Disposition: A | Discharge status: Home / Self Care | Payer: Medicare HMO | Attending: Emergency Medicine | Admitting: Emergency Medicine

## 2023-07-31 ENCOUNTER — Other Ambulatory Visit: Payer: Self-pay

## 2023-07-31 DIAGNOSIS — T7840XA Allergy, unspecified, initial encounter: Secondary | ICD-10-CM | POA: Insufficient documentation

## 2023-07-31 DIAGNOSIS — I10 Essential (primary) hypertension: Secondary | ICD-10-CM | POA: Insufficient documentation

## 2023-07-31 MED ORDER — CETIRIZINE HCL 10 MG PO TABS: 10.0000 mg | ORAL_TABLET | Freq: Two times a day (BID) | ORAL | 2 refills | Status: AC | PRN

## 2023-07-31 MED ORDER — PREDNISONE 50 MG PO TABS: ORAL_TABLET | ORAL | 0 refills | Status: DC

## 2023-07-31 MED ORDER — PREDNISONE 20 MG PO TABS
60.0000 mg | ORAL_TABLET | Freq: Once | ORAL | Status: AC
  Administered 2023-07-31: 60 mg via ORAL
  Filled 2023-07-31: qty 3

## 2023-07-31 MED ORDER — METHYLPREDNISOLONE SODIUM SUCC 125 MG IJ SOLR
125.0000 mg | Freq: Once | INTRAMUSCULAR | Status: AC
  Administered 2023-07-31: 125 mg via INTRAVENOUS
  Filled 2023-07-31: qty 2

## 2023-07-31 MED ORDER — FAMOTIDINE IN NACL 20-0.9 MG/50ML-% IV SOLN
20.0000 mg | Freq: Once | INTRAVENOUS | Status: AC
  Administered 2023-07-31: 20 mg via INTRAVENOUS
  Filled 2023-07-31: qty 50

## 2023-07-31 MED ORDER — EPINEPHRINE 0.3 MG/0.3ML IJ SOAJ: 0.3000 mg | INTRAMUSCULAR | 0 refills | Status: AC | PRN

## 2023-07-31 NOTE — Discharge Instructions (Addendum)
Take prednisone for the full 4 days as prescribed.  Take cetirizine once in the morning and once at night for the next 2 days, and then only as needed for allergic reactions.  Avoid red meat for now as well as the new detergent that may have caused your allergic reaction.   If you experience any severe allergic reactions especially if it causes difficulty breathing or closing of her throat, use the EpiPen that I have prescribed for you, in cases where you are unsure call 911 and seek their advice.  Thank you for choosing [place] for your health care today!  Please see your primary doctor this week for a follow up appointment.   If you have any new, worsening, or unexpected symptoms call your doctor right away or come back to the emergency department for reevaluation.  It was my pleasure to care for you today.   Daneil Dan Modesto Charon, MD

## 2023-07-31 NOTE — ED Notes (Signed)
Pt reports he took Benadryl last night around 9pm 50mg .

## 2023-07-31 NOTE — ED Provider Notes (Signed)
[facility] Provider Note    Event Date/Time   First MD Initiated Contact with Patient 07/31/23 [phone]     (approximate)   History   Allergic Reaction   HPI  Bradley Morrison is a 47 y.o. male   Past medical history of stroke, cerebral palsy, anxiety and hypertension presents emergency department with allergic reaction.  He ate red meat which he thinks he is allergic to, about 2 days ago and developed some itching after he took Benadryl this resolved.  He had a new detergent that he used today and ate some more red meat last evening which resulted in recurrence of whole body itching hives and lower lip tingling/swelling and the sensation his throat was closing.  He took a Benadryl prior to coming to the emergency department starting to feel better.  No GI symptoms.  No other exposures aside from above.   External Medical Documents Reviewed: August discharge summary from hospitalization for stroke      Physical Exam   Triage Vital Signs: ED Triage Vitals  Encounter Vitals Group     BP 07/31/23 0044 (!) 142/95     Systolic BP Percentile --      Diastolic BP Percentile --      Pulse Rate 07/31/23 0044 79     Resp 07/31/23 0044 20     Temp 07/31/23 0044 97.9 F (36.6 C)     Temp Source 07/31/23 0044 Oral     SpO2 07/31/23 0044 96 %     Weight 07/31/23 0046 209 lb (94.8 kg)     Height 07/31/23 0046 5\' 6"  (1.676 m)     Head Circumference --      Peak Flow --      Pain Score 07/31/23 0046 0     Pain Loc --      Pain Education --      Exclude from Growth Chart --     Most recent vital signs: Vitals:   07/31/23 0044  BP: (!) 142/95  Pulse: 79  Resp: 20  Temp: 97.9 F (36.6 C)  SpO2: 96%    General: Awake, no distress.  CV:  Good peripheral perfusion.  Resp:  Normal effort.  Abd:  No distention.  Other:  Awake alert comfortable appearing no acute distress hypertensive otherwise vital signs normal.  Phonation is normal, airway intact, no  respiratory distress no wheezing no tenderness to palpation of the abdomen no rashes noted, and posterior oropharynx appears normal without any swelling, no intraoral swelling or lip swelling   ED Results / Procedures / Treatments   Labs (all labs ordered are listed, but only abnormal results are displayed) Labs Reviewed - No data to display  PROCEDURES:  Critical Care performed: No  Procedures   MEDICATIONS ORDERED IN ED: Medications  predniSONE (DELTASONE) tablet 60 mg (has no administration in time range)  methylPREDNISolone sodium succinate (SOLU-MEDROL) 125 mg/2 mL injection 125 mg (125 mg Intravenous Given 07/31/23 0104)  famotidine (PEPCID) IVPB 20 mg premix (0 mg Intravenous Stopped 07/31/23 0141)    IMPRESSION / MDM / ASSESSMENT AND PLAN / ED COURSE  I reviewed the triage vital signs and the nursing notes.                                Patient's presentation is most consistent with acute presentation with potential threat to life or bodily function.  Differential diagnosis includes,  but is not limited to, allergic reaction, considered but less likely anaphylaxis or angioedema airway compromise   The patient is on the cardiac monitor to evaluate for evidence of arrhythmia and/or significant heart rate changes.  MDM:    Mostly resolved allergic reaction, no evidence of anaphylaxis at this time, had been given Solu-Medrol, took antihistamine at home.  Looks well now.  Will give him a course of steroids, antihistamine, avoid new exposures red meat detergent that may have caused his allergic reaction.  Discharge.       FINAL CLINICAL IMPRESSION(S) / ED DIAGNOSES   Final diagnoses:  Allergic reaction, initial encounter     Rx / DC Orders   ED Discharge Orders          Ordered    EPINEPHrine 0.3 mg/0.3 mL IJ SOAJ injection  As needed        07/31/23 0441    cetirizine (ZYRTEC ALLERGY) 10 MG tablet  2 times daily PRN        07/31/23 0441    predniSONE  (DELTASONE) 50 MG tablet        07/31/23 0441             Note:  This document was prepared using Dragon voice recognition software and may include unintentional dictation errors.    Pilar Jarvis, MD 07/31/23 [phone]

## 2023-07-31 NOTE — ED Triage Notes (Signed)
Pt reports about 2 days ago he broke up on hives,and lip swelling. Pt reports he took Benadryl at home. Pt reports today he is starting to have a rash, pt reports bottom lip continue to be swollen and new symptom feels like his throat is closing. Pt talks in complete sentences in triage no respiratory distress noted

## 2023-08-02 ENCOUNTER — Telehealth: Payer: Self-pay | Admitting: Specialist

## 2023-08-02 NOTE — Telephone Encounter (Signed)
Patient of Dr. Greggory Brandy of [facility] called.  Has a question on referral. Please return call. Shirlean Mylar, MHA, OT/L [phone]

## 2023-08-03 NOTE — Telephone Encounter (Signed)
Notified they need to speak with [name] about clearance for surgery.

## 2023-08-09 ENCOUNTER — Institutional Professional Consult (permissible substitution): Payer: Medicare HMO | Admitting: Neurology

## 2023-08-11 ENCOUNTER — Telehealth: Payer: Self-pay | Admitting: Neurology

## 2023-08-11 NOTE — Telephone Encounter (Signed)
I spoke with patient tonight.  I explained to him that I received an urgent medical clearance for him for dental procedure.  I reviewed Dr. Marlis Edelson notes, patient confirmed the last time he had a stroke as far as he knows is May 28, 2023.  In summary, patient had recent strokes, last admission May 28, 2023.  I explained to patient that we recommend no elective procedures for 3 months after last acute event due to increased risk of stroke within this timeframe.  We recommend if the procedure is elective that they wait 3 months due to increased stroke risk in the first 3 months post stroke.  But if procedure is urgently needed, such as in the case of infection, severe pain or other significant consequence of not having the dental work completed immediately then patient can have the procedure done as long as he is aware that within the 3 months the stroke risk is increased as compared to after 3 months.  There will always be an increased stroke risk during procedures even after the 39-month..  However the risk is greater in the 3 months right after event especially if aspirin has to be discontinued.  Patient was unclear how severe his condition was, he says he has tooth pain, he did not know if he had abscess or infection or any concerning finding that would significantly put his health at risk or whether this was just an elective procedure.  Patient asked that we let dentist know and dentist will determine the urgency.  I wrote a brief explanation on the medical clearance.  In summary we recommend that patient waits 3 months after an acute cerebrovascular accident for any elective procedures due to increased risk of stroke, but if the reason is urgent (delaying the procedure will put the patient at significant risk) then patient must understand the risk versus benefits and can decide to have the procedure completed.  Patient understands that no matter what timeframe after acute cerebrovascular accident there is  always an increased risk of stroke but this risk is higher in the first 3 months right after and higher also if aspirin is discontinued even temporarily.  These risks versus benefits were discussed with patient.  You can attach this telephone note to the medical clearance in case my handwriting is not legible.  Dr. Naomie Dean, [facility]

## 2023-08-12 NOTE — Telephone Encounter (Addendum)
Faxed over surgical clearance this am to dentist  office and placed signed packet on Dr Teresa Coombs  desk (work in [place] this am )  Per request of Dr Lucia Gaskins

## 2023-08-25 ENCOUNTER — Institutional Professional Consult (permissible substitution): Payer: Medicare HMO | Admitting: Neurology

## 2023-08-26 ENCOUNTER — Telehealth: Payer: Self-pay | Admitting: Adult Health

## 2023-08-26 ENCOUNTER — Encounter: Payer: Self-pay | Admitting: Adult Health

## 2023-08-26 ENCOUNTER — Telehealth: Payer: Self-pay | Admitting: Neurology

## 2023-08-26 NOTE — Telephone Encounter (Signed)
Unable to reach pt over the phone, no VM box. Sent mychart msg informing pt of need to reschedule 10/11/23 appt - NP out

## 2023-08-26 NOTE — Telephone Encounter (Signed)
Pt rescheduled appt on 09/21/23 at 2:15 pm

## 2023-08-26 NOTE — Telephone Encounter (Signed)
error 

## 2023-09-01 ENCOUNTER — Ambulatory Visit: Payer: Medicare HMO | Admitting: Neurology

## 2023-09-07 ENCOUNTER — Encounter: Payer: Medicare HMO | Attending: Physical Medicine & Rehabilitation | Admitting: Physical Medicine & Rehabilitation

## 2023-09-07 DIAGNOSIS — I6350 Cerebral infarction due to unspecified occlusion or stenosis of unspecified cerebral artery: Secondary | ICD-10-CM | POA: Insufficient documentation

## 2023-09-21 ENCOUNTER — Ambulatory Visit: Payer: Medicare HMO

## 2023-09-21 ENCOUNTER — Ambulatory Visit (INDEPENDENT_AMBULATORY_CARE_PROVIDER_SITE_OTHER): Payer: Medicare HMO | Admitting: Adult Health

## 2023-09-21 ENCOUNTER — Encounter: Payer: Self-pay | Admitting: Adult Health

## 2023-09-21 VITALS — BP 145/89 | HR 83 | Ht 66.0 in | Wt 219.0 lb

## 2023-09-21 DIAGNOSIS — I6381 Other cerebral infarction due to occlusion or stenosis of small artery: Secondary | ICD-10-CM | POA: Diagnosis not present

## 2023-09-21 DIAGNOSIS — G4733 Obstructive sleep apnea (adult) (pediatric): Secondary | ICD-10-CM

## 2023-09-21 NOTE — Progress Notes (Signed)
[facility] [address] [place]. [place] [address] [phone]       OFFICE FOLLOW UP NOTE  Bradley Morrison Date of Birth:  December 13, 1975 Medical Record Number:  [id number]   Primary neurologist: Dr. Pearlean Brownie Reason for visit: Stroke follow-up  HPI:    Update 09/21/2023 JM: Patient returns for stroke follow-up.   Patient's main concern today is in regards to continued fatigue. He will nap daily despite getting good sleep at night. He was previously diagnosed with sleep apnea and placed on CPAP therapy but has not been able to use recently as machine not working appropriately, reports he received his machine over 6 years ago.  He was previously referred to [facility] sleep clinic but was denied as he was previously followed by pulmonology and recommended follow up with their office.  He was seen once by [facility] in [place] in 12/2021, noted adequate compliance and optimal residual AHI, noted prior PSG in 2015 showed AHI 7/hr, REM/supine AHI 66/hr but unable to view sleep study via epic.  He is concerned that he has not been able to use his CPAP machine and risk of increased stroke with untreated apnea.   In regards to his stroke, he reports residual LLE weakness, plans on restarting PT tomorrow. Feels like leg wants to turn outwards while walking which is painful, ambulates with a cane outdoors, does not use indoors, no recent falls.  Denies any residual stroke deficits.  Denies new stroke/TIA symptoms.  Remains on aspirin and Lipitor.  Routinely follows with PCP for stroke risk factor management.    History provided for reference purposes only Consult visit 07/08/2023 Dr. Pearlean Brownie: Bradley Morrison is a 47 year old male with past medical history of diabetes, hypertension, anxiety, cerebral palsy who is seen today for initial office consultation visit for stroke.  History is obtained from him and his mother as well as review of electronic medical records as well as [facility]  records in care everywhere.  I personally reviewed pertinent available imaging films lab work and hospital evaluation.  He was  initially admitted to [facility] in [place] on 05/19/2023 with sudden onset of dysarthria facial droop and mild left hemibody sensory loss and weakness.  He presented outside time window for thrombolysis.  MRI scan of the brain confirmed right pontine lacunar infarct.  Echocardiogram showed no significant cardiac source of embolism.  EKG and telemetry monitoring showed sinus rhythm.  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  LDL cholesterol was elevated and then 10 mg percent.  Patient was started on aspirin and Plavix and discharged home with outpatient therapy however before therapy could be started his symptoms got worse and he was admitted to [facility] on 05/28/2023 with increasing left-sided deficits.  Mri scan this time showed bilateral paramedian pontine lacunar infarcts and it was felt to be extension of his recent stroke.  LDL cholesterol was 64 mg percent and hemoglobin A1c was 6.5.  Hypercoagulable panel labs were negative and urine drug screen was positive for marijuana.  Patient is currently getting outpatient physical and Occupational Therapy and has finished speech therapy.  He is able to walk with the cane now though he still feels left leg is slightly weak and he cannot trust it.  He has had no falls or injuries.  If states that he is learned his lesson and is turning his life  around.  He has quit smoking cigarettes and marijuana as well as alcohol.  He still has some occasional word finding difficulties but speech is mostly improved.  He has some baseline walking difficulties due to cerebral palsy and a small left leg.  He is tolerating aspirin well without bruising or bleeding Lipitor without any side effects.  States his sugars under  better control plans to have regular follow-up visits with his primary care physician.     ROS:   14 system review of systems is positive for those listed in HPI and all other systems negative  PMH:  Past Medical History:  Diagnosis Date   Anxiety    CP (cerebral palsy) (HCC)    Hypertension    Stroke (HCC) 05/18/2023    Social History:  Social History   Socioeconomic History   Marital status: Legally Separated    Spouse name: Not on file   Number of children: Not on file   Years of education: Not on file   Highest education level: Not on file  Occupational History   Not on file  Tobacco Use   Smoking status: Every Day    Current packs/day: 1.00    Types: Cigarettes   Smokeless tobacco: Never  Vaping Use   Vaping status: Never Used  Substance and Sexual Activity   Alcohol use: No   Drug use: No   Sexual activity: Not on file  Other Topics Concern   Not on file  Social History Narrative   Not on file   Social Determinants of Health   Financial Resource Strain: Low Risk  (05/19/2023)   Received from [facility]   Overall Financial Resource Strain (CARDIA)    Difficulty of Paying Living Expenses: Not hard at all  Food Insecurity: No Food Insecurity (05/28/2023)   Hunger Vital Sign    Worried About Running Out of Food in the Last Year: Never true    Ran Out of Food in the Last Year: Never true  Transportation Needs: No Transportation Needs (05/28/2023)   PRAPARE - Administrator, Civil Service (Medical): No    Lack of Transportation (Non-Medical): No  Physical Activity: Not on file  Stress: Not on file  Social Connections: Not on file  Intimate Partner Violence: Not At Risk (05/28/2023)   Humiliation, Afraid, Rape, and Kick questionnaire    Fear of Current or Ex-Partner: No    Emotionally Abused: No    Physically Abused: No    Sexually Abused: No    Medications:   Current Outpatient Medications on File Prior to Visit  Medication Sig Dispense  Refill   acetaminophen (TYLENOL) 325 MG tablet Take 1-2 tablets (325-650 mg total) by mouth every 4 (four) hours as needed for mild pain.     amLODipine (NORVASC) 10 MG tablet Take 10 mg by mouth daily.     aspirin EC 81 MG tablet Take 1 tablet (81 mg total) by mouth daily. Swallow whole. 150 tablet 0   cetirizine (ZYRTEC ALLERGY) 10 MG tablet Take 1 tablet (10 mg total) by mouth 2 (two) times daily as needed for allergies. 30 tablet 2   clopidogrel (PLAVIX) 75 MG tablet Take 1 tablet (75 mg total) by mouth daily. 8 tablet 0   dapagliflozin propanediol (FARXIGA) 10 MG TABS tablet Take 1 tablet (10 mg total) by mouth daily. 30 tablet 0   docusate sodium (COLACE) 100 MG capsule Take 2 capsules (200 mg total) by mouth 2 (two) times daily.  300 capsule 0   empagliflozin (JARDIANCE) 10 MG TABS tablet Take 1 tablet by mouth daily.     EPINEPHrine 0.3 mg/0.3 mL IJ SOAJ injection Inject 0.3 mg into the muscle as needed. 1 each 0   gabapentin (NEURONTIN) 300 MG capsule Take 1 capsule (300 mg total) by mouth 3 (three) times daily. 90 capsule 0   gabapentin (NEURONTIN) 300 MG capsule Take by mouth.     magnesium oxide (MAG-OX) 400 MG tablet Take 0.5 tablets (200 mg total) by mouth at bedtime. 30 tablet 0   metFORMIN (GLUCOPHAGE) 500 MG tablet Take 1 tablet (500 mg total) by mouth daily with breakfast. 30 tablet 0   polyethylene glycol powder (GLYCOLAX/MIRALAX) 17 GM/SCOOP powder Take 17 g by mouth daily. 476 g 0   predniSONE (DELTASONE) 50 MG tablet Take 1 tablet daily for the next 4 days 4 tablet 0   Tiotropium Bromide-Olodaterol (STIOLTO RESPIMAT) 2.5-2.5 MCG/ACT AERS Inhale 2 puffs into the lungs daily at 6 (six) AM. 4 g 0   traZODone (DESYREL) 50 MG tablet Take 0.5-1 tablets (25-50 mg total) by mouth at bedtime as needed for sleep. 30 tablet 0   trolamine salicylate (ASPERCREME) 10 % cream Apply topically 2 (two) times daily as needed for muscle pain. To left hip 85 g 0   atorvastatin (LIPITOR) 80 MG  tablet Take 1 tablet (80 mg total) by mouth daily. 30 tablet 0   No current facility-administered medications on file prior to visit.    Allergies:   Allergies  Allergen Reactions   Zestril [Lisinopril] Swelling    Lip swelling    Physical Exam Today's Vitals   09/21/23 1404  BP: (!) 145/89  Pulse: 83  Weight: 219 lb (99.3 kg)  Height: 5\' 6"  (1.676 m)   Body mass index is 35.35 kg/m.  General: well developed, well nourished very pleasant middle-aged male, seated, in no evident distress  Neurologic Exam Mental Status: Awake and fully alert.  Occasional speech hesitancy but unable to appreciate dysarthria or aphasia.  Oriented to place and time. Recent and remote memory intact. Attention span, concentration and fund of knowledge appropriate. Mood and affect appropriate.  Cranial Nerves: Pupils equal, briskly reactive to light. Extraocular movements full without nystagmus. Visual fields full to confrontation. Hearing intact. Facial sensation intact. Face, tongue, palate moves normally and symmetrically.  Motor: Normal bulk and tone. Normal strength in all tested extremity muscles except mild left hip flexor and ankle dorsiflexor weakness.  Sensory.: intact to touch , pinprick , position and vibratory sensation.  Coordination: Rapid alternating movements normal in all extremities. Finger-to-nose and heel-to-shin performed accurately bilaterally. Gait and Station: Arises from chair without difficulty. Stance is normal. Gait demonstrates slight dragging of the left leg and stiffness and uses a cane...  Unable to heel, toe and tandem walk without difficulty.         ASSESSMENT: 47 year old Caucasian male with bilateral paramedian pontine lacunar infarcts in July 2024 secondary to small vessel disease.  He is doing reasonably well with only mild residual left leg weakness.  Vascular risk factors of diabetes, hypertension, hyperlipidemia, obesity, cigarette and marijuana abuse and hx of  sleep apnea     PLAN:  Pontine stroke Restart therapy tomorrow as scheduled for residual LLE weakness and gait impairment Continue aspirin and atorvastatin for secondary stroke prevention measures managed/prescribed by PCP Continue close PCP follow-up for aggressive stroke risk factor management  Sleep apnea Previously diagnosed with sleep apnea and placed on CPAP therapy but  current CPAP machine malfunctioning and per patient report, due for new machine Previously seen once by pulmonology in 12/2021 at [facility] in [place] but patient not satisfied with care and is requesting transfer of care to our office Discussed with sleep clinic who will have our sleep physicians further review    Overall stable from stroke standpoint, can follow up as needed at this time.     I spent 30 minutes of face-to-face and non-face-to-face time with patient.  This included previsit chart review, lab review, study review, electronic health record documentation, patient education and discussion regarding above diagnoses and treatment plan and answered all other questions to patient's satisfaction  Ihor Austin, [facility]  [facility] [address] [place], [place] [address]  Phone [phone] Fax [phone] Note: This document was prepared with digital dictation and possible smart phrase technology. Any transcriptional errors that result from this process are unintentional.

## 2023-09-21 NOTE — Patient Instructions (Addendum)
Restart therapy tomorrow as scheduled for hopeful ongoing recovery   Will follow up regarding sleep apnea evaluation - will update you via MyChart   Continue aspirin 81 mg daily  and atorvastatin  for secondary stroke prevention  Continue to follow up with PCP regarding cholesterol, diabetes and blood prssure management  Maintain strict control of hypertension with blood pressure goal below 130/90, diabetes with hemoglobin A1c goal below 7.0 % and cholesterol with LDL cholesterol (bad cholesterol) goal below 70 mg/dL.   Signs of a Stroke? Follow the BEFAST method:  Balance Watch for a sudden loss of balance, trouble with coordination or vertigo Eyes Is there a sudden loss of vision in one or both eyes? Or double vision?  Face: Ask the person to smile. Does one side of the face droop or is it numb?  Arms: Ask the person to raise both arms. Does one arm drift downward? Is there weakness or numbness of a leg? Speech: Ask the person to repeat a simple phrase. Does the speech sound slurred/strange? Is the person confused ? Time: If you observe any of these signs, call 911.       Thank you for coming to see [place] at [facility]. I hope we have been able to provide you high quality care today.  You may receive a patient satisfaction survey over the next few weeks. We would appreciate your feedback and comments so that we may continue to improve ourselves and the health of our patients.

## 2023-09-22 ENCOUNTER — Ambulatory Visit: Payer: Medicare HMO | Attending: Physical Medicine & Rehabilitation

## 2023-09-22 DIAGNOSIS — I6350 Cerebral infarction due to unspecified occlusion or stenosis of unspecified cerebral artery: Secondary | ICD-10-CM | POA: Diagnosis not present

## 2023-09-22 DIAGNOSIS — R278 Other lack of coordination: Secondary | ICD-10-CM

## 2023-09-22 DIAGNOSIS — R262 Difficulty in walking, not elsewhere classified: Secondary | ICD-10-CM | POA: Diagnosis present

## 2023-09-22 DIAGNOSIS — R2681 Unsteadiness on feet: Secondary | ICD-10-CM | POA: Diagnosis present

## 2023-09-22 DIAGNOSIS — M6281 Muscle weakness (generalized): Secondary | ICD-10-CM

## 2023-09-22 DIAGNOSIS — R42 Dizziness and giddiness: Secondary | ICD-10-CM | POA: Insufficient documentation

## 2023-09-22 NOTE — Therapy (Signed)
OUTPATIENT PHYSICAL THERAPY NEURO EVALUATION   Patient Name: Bradley Morrison MRN: [id number] DOB:December 18, 1975, 47 y.o., male Today's Date: 09/22/2023   PCP: Bradley Starch, MD  REFERRING PROVIDER: Erick Colace, MD   END OF SESSION:  PT End of Session - 09/22/23 1656     Visit Number 1    Number of Visits 25    Date for PT Re-Evaluation 12/15/23    PT Start Time 1102    PT Stop Time 1145    PT Time Calculation (min) 43 min    Equipment Utilized During Treatment Gait belt    Activity Tolerance Patient tolerated treatment well    Behavior During Therapy WFL for tasks assessed/performed             Past Medical History:  Diagnosis Date   Anxiety    CP (cerebral palsy) (HCC)    Hypertension    Stroke (HCC) 05/18/2023   History reviewed. No pertinent surgical history. 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) 06/02/2023   Acute ischemic stroke (HCC) 05/28/2023   Dyslipidemia 05/28/2023   Essential hypertension 05/28/2023   Type 2 diabetes mellitus with peripheral neuropathy (HCC) 05/28/2023   Marijuana abuse 05/28/2023   CVA (cerebral vascular accident) (HCC) 05/28/2023   Obstructive sleep apnea 03/05/2020   Recurrent major depressive disorder, in partial remission (HCC) 08/25/2018   Anxiety    Obesity (BMI 30-39.9) 10/31/2014   Hypertension associated with diabetes (HCC) 05/21/2014    ONSET DATE: 05/18/2023  REFERRING DIAG:  Diagnosis  I63.50 (ICD-10-CM) - Cerebrovascular accident (CVA) of pontine structure (HCC)    THERAPY DIAG:  Difficulty in walking, not elsewhere classified  Muscle weakness (generalized)  Unsteadiness on feet  Other lack of coordination  Rationale for Evaluation and Treatment: Rehabilitation  SUBJECTIVE:                                                                                                                                                                                              SUBJECTIVE STATEMENT:  The pt is a pleasant 47 yo male referred to PT s/p CVA. Pt states he had "four strokes"  from 05/18/2023-05/27/2023 and was admitted to the hospital 2x (05/18/2023, 05/27/2023). His main concerns are LLE strength and gait impairments.. He has a hx of CP, but states he was walking fine prior to CVA, but now must use a SPC. He reports stroke has mainly affected LLE. He notes L knee hyperext during gait and  wants to work on this. Pt reports he also now has pain in LLE/foot with prolonged ambulation. Pt reports 1 fall since stroke that occurred outside when his foot "got tangled up in twigs." His balance is not very good without his cane. He is indep with ADLs but does have some trouble dressing. Pt reports some sleep difficulty since stroke. Pt had PT at [facility] for a week and then [facility] PT.  Pt accompanied by: self  PERTINENT HISTORY:   The pt is a pleasant 47 yo male referred to PT s/p CVA. Pt states he had "four strokes"  from 05/18/2023-05/27/2023 and was admitted to the hospital 2x (05/18/2023, 05/27/2023). His main concerns are LLE strength and gait impairments.. He has a hx of CP, but states he was walking fine prior to CVA, but now must use a SPC. He reports stroke has mainly affected LLE. He notes L knee hyperext during gait and wants to work on this. Pt reports he also now has pain in LLE/foot with prolonged ambulation. Pt reports 1 fall since stroke that occurred outside when his foot "got tangled up in twigs." His balance is not very good without his cane. He is indep with ADLs but does have some trouble dressing. Pt reports some sleep difficulty since stroke. Pt had PT at [facility] for a week and then [facility] PT.  PMH significant for dysphagia, CVA, tobacco use disorder (1 PDD), DMII with peripheral neuropathy, essential HTN, marijuana misuse, obstructive sleep  apnea, recurrent major depressive disorder in partial remission, anxiety, CP  PAIN:  Are you having pain? Yes: NPRS scale: not rated/10 Pain location: LLE/L foot Pain description: ache Aggravating factors: weightbearing  Relieving factors: not reported  PRECAUTIONS: Fall  RED FLAGS: Bowel or bladder incontinence: Yes: dr aware     WEIGHT BEARING RESTRICTIONS: No  FALLS: Has patient fallen in last 6 months? Yes. Number of falls 1  LIVING ENVIRONMENT: Lives with: lives with their family Lives in: Other lives at father's house, lives in camper at father's house Stairs: Yes: External: 3 steps; none no handrails but grabs onto doorframe? Has following equipment at home: Single point cane and Walker - 2 wheeled  PLOF: Independent  PATIENT GOALS:  I want to be able to walk normal or as close to normal as I can get. He wants more control of L knee.   OBJECTIVE:  Note: Objective measures were completed at Evaluation unless otherwise noted.  DIAGNOSTIC FINDINGS:   Via chart 05/27/23 Bradley Killian, MD "ED Course: Upon presentation to the emergency room, BP was 160/96 with temperature 97.5 with otherwise normal vital signs.  Labs revealed unremarkable CMP.  CBC showed leukocytosis 12.4 with neutrophilia.  UA was negative.  Urine drug screen was positive for cannabinoids. EKG as reviewed by me : EKG showed sinus rhythm with a rate of 86 with nonspecific interventricular conduction delay and inferior Q waves. Imaging: Code stroke CT revealed old no acute intracranial abnormalities.  CTA of the head and neck revealed normal CT perfusion with no emergent large vessel occlusion or high-grade stenosis of the intracranial arteries.   The patient was not considered a candidate for tPA per teleneurology consult due to recent CVA.  He will be admitted to an observation medical telemetry [facility] for evaluation and management."   05/30/23 MR BRAIN " IMPRESSION: 1. Bilateral lower pontine abnormal  diffusion, now more intense on the left side versus two days ago. Increased T2 hyperintensity with no hemorrhage or mass effect.  No new areas of involvement. Small-vessel ischemic etiology is possible. Other etiologies of symmetric brain diffusion abnormality were considered (such as toxic exposure or myelinosis), but seem unlikely given the pattern here.   2. New acute intracranial abnormality. And otherwise mild nonspecific cerebral white matter signal changes.     Electronically Signed   By: Odessa Fleming M.D.   On: 05/30/2023 04:29"  COGNITION: Overall cognitive status: Within functional limits for tasks assessed   SENSATION: WFL BLE   COORDINATION: UE WFL  LE rapid alt movement WNL, some difficulty performing testing due to LLE weakness  EDEMA:  Reports no swelling, none observed during eval   POSTURE: rounded shoulders, postural impairment with gait (see below)   LOWER EXTREMITY MMT:    Grossly 4+/5 BLE, but  LLE with greater deficits, particularly hip flexors  Some pain with testing DF bilaterally   TRANSFERS: Assistive device utilized: Single point cane  Sit to stand: SBA Stand to sit: Complete Independence    GAIT: Gait pattern/comments:  Increased supination L foot/ankle during gait, decreased gait speed, L hip hike, decreased L foot clearance/lack of heel-strike Distance walked: , Assistive device utilized: Single point cane Level of assistance: Modified independence   FUNCTIONAL TESTS:  5 times sit to stand: 12.9 sec hands-free Timed up and go (TUG): deferred  6 minute walk test: 794 ft with SPC 10 meter walk test: 0.67 m/s  with [facility]  Berg Balance Scale: deferred   PATIENT SURVEYS:  FOTO 57 (goal score 67)  TODAY'S TREATMENT:                                                                                                                              DATE: 09/22/23 Eval only    PATIENT EDUCATION: Education details: assessment/eval  findings, goals, plan Person educated: Patient Education method: Explanation Education comprehension: verbalized understanding  HOME EXERCISE PROGRAM: Initiate next visit  GOALS: Goals reviewed with patient? Yes  SHORT TERM GOALS: Target date: 11/03/2023    Patient will be independent in home exercise program to improve strength/mobility for better functional independence with ADLs. Baseline: Goal status: INITIAL   LONG TERM GOALS: Target date: 12/15/2023    Patient will increase FOTO score to equal to or greater than  67   to demonstrate statistically significant improvement in mobility and quality of life.  Baseline: 57 Goal status: INITIAL  2.  Patient (<65 years old) will complete five times sit to stand test in < 10 seconds indicating an increased LE strength and improved balance. Baseline: 12.9 sec Goal status: INITIAL  3.  Patient will increase Berg Balance score by > 6 points to demonstrate decreased fall risk during functional activities Baseline:  Goal status: INITIAL  4.  Patient will increase 10 meter walk test to >1.76m/s as to improve gait speed for better community ambulation and to reduce fall risk. Baseline: 0.67 m/s with SPC Goal status: INITIAL  5.  Patient will  reduce timed up and go to <11 seconds to reduce fall risk and demonstrate improved transfer/gait ability. Baseline:  Goal status: INITIAL  6.  Patient will increase six minute walk test distance to >1000 for progression to community ambulator and improve gait ability  Baseline: 794 ft with SPC Goal status: INITIAL   ASSESSMENT:  CLINICAL IMPRESSION: Patient is a pleasant 47 y.o. male who was seen today for physical therapy evaluation and treatment for deficits following CVA. Assessment indicates pt has the following impairments: pain, decreased BLE strength, decreased balance, decreased gait ability/gait speed, gait impairments and increased fall risk. The pt will benefit from further  skilled PT to improve these deficits in order to increase QOL and return pt to PLOF.   OBJECTIVE IMPAIRMENTS: Abnormal gait, decreased balance, decreased coordination, decreased endurance, decreased mobility, difficulty walking, decreased ROM, decreased strength, improper body mechanics, postural dysfunction, and pain.   ACTIVITY LIMITATIONS: carrying, squatting, stairs, transfers, and locomotion level  PARTICIPATION LIMITATIONS: shopping, community activity, and yard work  PERSONAL FACTORS: 3+ comorbidities: PMH significant for dysphagia, CVA, tobacco use disorder (1 PDD), DMII with peripheral neuropathy, essential HTN, marijuana misuse, obstructive sleep apnea, recurrent major depressive disorder in partial remission, anxiety, CP  are also affecting patient's functional outcome.   REHAB POTENTIAL: Good  CLINICAL DECISION MAKING: Evolving/moderate complexity  EVALUATION COMPLEXITY: Moderate  PLAN:  PT FREQUENCY: 1-2x/week  PT DURATION: 12 weeks  PLANNED INTERVENTIONS: 97164- PT Re-evaluation, 97110-Therapeutic exercises, 97530- Therapeutic activity, 97112- Neuromuscular re-education, 97535- Self Care, 40981- Manual therapy, [phone]- Gait training, [phone]- Orthotic Fit/training, [phone]- Canalith repositioning, [phone]- Splinting, Patient/Family education, Balance training, Stair training, Taping, Dry Needling, Joint mobilization, Spinal mobilization, Vestibular training, DME instructions, Cryotherapy, and Biofeedback  PLAN FOR NEXT SESSION: BERG, TUG, HEP, include STS/endurance activities    Baird Kay, PT 09/22/2023, 5:21 PM

## 2023-09-27 ENCOUNTER — Ambulatory Visit: Payer: Medicare HMO | Admitting: Physical Therapy

## 2023-09-28 ENCOUNTER — Ambulatory Visit: Payer: Medicare HMO

## 2023-09-28 DIAGNOSIS — R278 Other lack of coordination: Secondary | ICD-10-CM

## 2023-09-28 DIAGNOSIS — R262 Difficulty in walking, not elsewhere classified: Secondary | ICD-10-CM

## 2023-09-28 DIAGNOSIS — M6281 Muscle weakness (generalized): Secondary | ICD-10-CM

## 2023-09-28 DIAGNOSIS — R2681 Unsteadiness on feet: Secondary | ICD-10-CM

## 2023-09-28 NOTE — Therapy (Signed)
OUTPATIENT PHYSICAL THERAPY NEURO TREATMENT   Patient Name: BOYAN CHEEVER MRN: [id number] DOB:03-28-1976, 47 y.o., male Today's Date: 09/28/2023   PCP: Karle Starch, MD  REFERRING PROVIDER: Erick Colace, MD   END OF SESSION:  PT End of Session - 09/28/23 1645     Visit Number 2    Number of Visits 25    Date for PT Re-Evaluation 12/15/23    PT Start Time 1449    PT Stop Time 1529    PT Time Calculation (min) 40 min    Equipment Utilized During Treatment Gait belt    Activity Tolerance Patient tolerated treatment well    Behavior During Therapy WFL for tasks assessed/performed              Past Medical History:  Diagnosis Date   Anxiety    CP (cerebral palsy) (HCC)    Hypertension    Stroke (HCC) 05/18/2023   History reviewed. No pertinent surgical history. 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) 06/02/2023   Acute ischemic stroke (HCC) 05/28/2023   Dyslipidemia 05/28/2023   Essential hypertension 05/28/2023   Type 2 diabetes mellitus with peripheral neuropathy (HCC) 05/28/2023   Marijuana abuse 05/28/2023   CVA (cerebral vascular accident) (HCC) 05/28/2023   Obstructive sleep apnea 03/05/2020   Recurrent major depressive disorder, in partial remission (HCC) 08/25/2018   Anxiety    Obesity (BMI 30-39.9) 10/31/2014   Hypertension associated with diabetes (HCC) 05/21/2014    ONSET DATE: 05/18/2023  REFERRING DIAG:  Diagnosis  I63.50 (ICD-10-CM) - 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 Pt states he had "four strokes"  from 05/18/2023-05/27/2023 and was admitted to the hospital 2x (05/18/2023, 05/27/2023). His main concerns are LLE strength and gait impairments.. He has a hx of CP, but states he was walking fine prior to CVA, but now must use a SPC. He reports stroke has  mainly affected LLE. He notes L knee hyperext during gait and wants to work on this. Pt reports he also now has pain in LLE/foot with prolonged ambulation. Pt reports 1 fall since stroke that occurred outside when his foot "got tangled up in twigs." His balance is not very good without his cane. He is indep with ADLs but does have some trouble dressing. Pt reports some sleep difficulty since stroke. Pt had PT at [facility] for a week and then [facility] PT.  PMH significant for dysphagia, CVA, tobacco use disorder (1 PDD), DMII with peripheral neuropathy, essential HTN, marijuana misuse, obstructive sleep apnea, recurrent major depressive disorder in partial remission, anxiety, CP  PAIN:  Are you having pain? Yes: NPRS scale: not rated/10 Pain location: LLE/L foot Pain description: ache Aggravating factors: weightbearing  Relieving factors: not reported  PRECAUTIONS: Fall  RED FLAGS: Bowel or bladder incontinence: Yes: dr aware     WEIGHT BEARING RESTRICTIONS: No  FALLS: Has patient fallen in last 6 months? Yes. Number of falls 1  LIVING ENVIRONMENT: Lives with: lives with their family Lives in: Other lives at father's house, lives in camper at father's house Stairs: Yes: External: 3 steps; none no handrails but grabs onto doorframe? Has following equipment at home: Single point cane and Walker - 2 wheeled  PLOF: Independent  PATIENT GOALS:  I want to  be able to walk normal or as close to normal as I can get. He wants more control of L knee.   OBJECTIVE:  Note: Objective measures were completed at Evaluation unless otherwise noted.  DIAGNOSTIC FINDINGS:   Via chart 05/27/23 [name], MD "ED Course: Upon presentation to the emergency room, BP was 160/96 with temperature 97.5 with otherwise normal vital signs.  Labs revealed unremarkable CMP.  CBC showed leukocytosis 12.4 with neutrophilia.  UA was negative.  Urine drug screen was positive for cannabinoids. EKG as reviewed by me : EKG showed sinus rhythm with a rate of 86 with nonspecific interventricular conduction delay and inferior Q waves. Imaging: Code stroke CT revealed old no acute intracranial abnormalities.  CTA of the head and neck revealed normal CT perfusion with no emergent large vessel occlusion or high-grade stenosis of the intracranial arteries.   The patient was not considered a candidate for tPA per teleneurology consult due to recent CVA.  He will be admitted to an observation medical telemetry [facility] for evaluation and management."   05/30/23 MR BRAIN " IMPRESSION: 1. Bilateral lower pontine abnormal diffusion, now more intense on the left side versus two days ago. Increased T2 hyperintensity with no hemorrhage or mass effect. No new areas of involvement. Small-vessel ischemic etiology is possible. Other etiologies of symmetric brain diffusion abnormality were considered (such as toxic exposure or myelinosis), but seem unlikely given the pattern here.   2. New acute intracranial abnormality. And otherwise mild nonspecific cerebral white matter signal changes.     Electronically Signed   By: Odessa Fleming M.D.   On: 05/30/2023 04:29"  COGNITION: Overall cognitive status: Within functional limits for tasks assessed   SENSATION: WFL BLE   COORDINATION: UE WFL  LE rapid alt movement WNL, some difficulty performing testing due to LLE weakness  EDEMA:   Reports no swelling, none observed during eval   POSTURE: rounded shoulders, postural impairment with gait (see below)   LOWER EXTREMITY MMT:    Grossly 4+/5 BLE, but  LLE with greater deficits, particularly hip flexors  Some pain with testing DF bilaterally  TRANSFERS: Assistive device utilized: Single point cane  Sit to stand: SBA Stand to sit: Complete Independence    GAIT: Gait pattern/comments:  Increased supination L foot/ankle during gait, decreased gait speed, L hip hike, decreased L foot clearance/lack of heel-strike Distance walked: , Assistive device utilized: Single point cane Level of assistance: Modified independence   FUNCTIONAL TESTS:  5 times sit to stand: 12.9 sec hands-free Timed up and go (TUG): deferred  6 minute walk test: 794 ft with SPC 10 meter walk test: 0.67 m/s  with [facility]  Berg Balance Scale: deferred   PATIENT SURVEYS:  FOTO 57 (goal score 67)  TODAY'S TREATMENT:                                                                                                                              DATE: 09/28/23   NMR: TUG: 12.5 sec BERG: 53/56  Over 10 meters: Gait/dynamic balance with focus on heel-toe sequencing, completed with and without SPC. PT provides VC/demo and close CGA when pt completing with AD   SLB 2x30 sec each LE at support surface Tandem stance 2x30 sec each LE at support surface  TE: Seated DF 2x10  bilat LE Seated DF LLE only 10x to promote AROM/strength Standing hip flexion with 5# aw each LE 10x each LE difficult for LLE --repeated in seated 10x each LE STS 2x12    PATIENT EDUCATION: Education details: further assessment details, exercise technique, HEP Person educated: Patient Education method: Explanation Education comprehension: verbalized understanding  HOME EXERCISE PROGRAM:  Access Code: [email or web address] URL: [email or web address] Date: 09/28/2023 Prepared by: Temple Pacini  Exercises - Seated March  - 1-2 x daily - 5-6 x weekly - 2 sets - 10 reps - Sit to Stand  - 1-2 x daily - 5-6 x weekly - 2 sets - 12 reps - Standing Tandem Balance with Counter Support  - 1 x daily - 4-7 x weekly - 2 sets - 1 reps - 30 sec/leg hold - Standing Single Leg Stance with Counter Support  - 1 x daily - 4-7 x weekly - 2 sets - 1 reps - 30 sec/leg hold  GOALS: Goals reviewed with patient? Yes  SHORT TERM GOALS: Target date: 11/03/2023    Patient will be independent in home exercise program to improve strength/mobility for better functional independence with ADLs. Baseline: Goal status: INITIAL   LONG TERM GOALS: Target date: 12/15/2023    Patient will increase FOTO score to equal to or greater than  67   to demonstrate statistically significant improvement in mobility and quality of life.  Baseline: 57 Goal status: INITIAL  2.  Patient (<68 years old) will complete five times sit to stand test in < 10 seconds indicating an increased LE strength and improved balance. Baseline: 12.9 sec Goal status: INITIAL  3.  Patient will increase Berg Balance score by > 2 points to demonstrate decreased fall risk during functional activities Baseline: 53/56  Goal status: INITIAL/REVISED   4.  Patient will increase 10 meter walk test to >1.6m/s as to improve gait speed for better community ambulation and to reduce fall risk. Baseline: 0.67 m/s with SPC Goal status: INITIAL  5.  Patient will reduce timed up and go to <11 seconds to reduce fall risk and demonstrate improved transfer/gait ability. Baseline: 12.5 sec  Goal status: INITIAL  6.  Patient will increase six minute walk test distance to >1000 for progression to community ambulator and improve gait ability  Baseline: 794 ft with SPC Goal status: INITIAL   ASSESSMENT:  CLINICAL IMPRESSION: Further assessment completed. TUG findings indicate pt just above cut-off for increased fall risk, however, pt with good  performance on BERG (not increased fall risk per this test), but did have difficulty with SLB and tandem stance. Deficits found on testing today added to HEP that was provided to pt. The pt will benefit from further skilled PT to improve these deficits in order to increase QOL and return pt to PLOF.   OBJECTIVE IMPAIRMENTS: Abnormal gait, decreased balance, decreased coordination, decreased endurance, decreased mobility, difficulty walking, decreased ROM, decreased strength, improper body mechanics, postural dysfunction, and pain.   ACTIVITY LIMITATIONS: carrying, squatting, stairs, transfers, and locomotion level  PARTICIPATION LIMITATIONS: shopping, community activity, and yard work  PERSONAL FACTORS: 3+ comorbidities: PMH significant for dysphagia, CVA, tobacco use disorder (1 PDD), DMII with peripheral neuropathy, essential HTN, marijuana misuse, obstructive sleep apnea, recurrent major depressive disorder in partial remission, anxiety, CP  are also affecting patient's functional outcome.   REHAB POTENTIAL: Good  CLINICAL DECISION MAKING: Evolving/moderate complexity  EVALUATION COMPLEXITY: Moderate  PLAN:  PT FREQUENCY: 1-2x/week  PT DURATION: 12 weeks  PLANNED INTERVENTIONS: 97164- PT Re-evaluation, 97110-Therapeutic exercises, 97530- Therapeutic activity, 97112- Neuromuscular re-education, 97535- Self Care, 62952- Manual therapy, [phone]- Gait training, [phone]- Orthotic Fit/training, [phone]- Canalith repositioning, [phone]- Splinting, Patient/Family education, Balance training, Stair training, Taping, Dry Needling, Joint mobilization, Spinal mobilization, Vestibular training, DME instructions, Cryotherapy, and Biofeedback  PLAN FOR NEXT SESSION: SLB and tandem balance activities, STS/endurance activities    Baird Kay, PT 09/28/2023, 4:54 PM

## 2023-09-30 ENCOUNTER — Ambulatory Visit: Payer: Medicare HMO

## 2023-09-30 DIAGNOSIS — R262 Difficulty in walking, not elsewhere classified: Secondary | ICD-10-CM

## 2023-09-30 DIAGNOSIS — M6281 Muscle weakness (generalized): Secondary | ICD-10-CM

## 2023-09-30 DIAGNOSIS — R2681 Unsteadiness on feet: Secondary | ICD-10-CM

## 2023-09-30 NOTE — Therapy (Signed)
OUTPATIENT PHYSICAL THERAPY NEURO TREATMENT   Patient Name: Bradley Morrison MRN: [id number] DOB:1976/01/22, 47 y.o., male Today's Date: 09/30/2023   PCP: Karle Starch, MD  REFERRING PROVIDER: Erick Colace, MD   END OF SESSION:  PT End of Session - 09/30/23 1531     Visit Number 3    Number of Visits 25    Date for PT Re-Evaluation 12/15/23    PT Start Time 1534    PT Stop Time 1614    PT Time Calculation (min) 40 min    Equipment Utilized During Treatment Gait belt    Activity Tolerance Patient tolerated treatment well    Behavior During Therapy WFL for tasks assessed/performed              Past Medical History:  Diagnosis Date   Anxiety    CP (cerebral palsy) (HCC)    Hypertension    Stroke (HCC) 05/18/2023   History reviewed. No pertinent surgical history. 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) 06/02/2023   Acute ischemic stroke (HCC) 05/28/2023   Dyslipidemia 05/28/2023   Essential hypertension 05/28/2023   Type 2 diabetes mellitus with peripheral neuropathy (HCC) 05/28/2023   Marijuana abuse 05/28/2023   CVA (cerebral vascular accident) (HCC) 05/28/2023   Obstructive sleep apnea 03/05/2020   Recurrent major depressive disorder, in partial remission (HCC) 08/25/2018   Anxiety    Obesity (BMI 30-39.9) 10/31/2014   Hypertension associated with diabetes (HCC) 05/21/2014    ONSET DATE: 05/18/2023  REFERRING DIAG:  Diagnosis  I63.50 (ICD-10-CM) - 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 Pt reports stumbling this morning, reports toe drag over carpet. No other updates. Pt accompanied by: self  PERTINENT HISTORY:   The pt is a pleasant 47 yo male referred to PT s/p CVA. Pt states he had "four strokes"  from 05/18/2023-05/27/2023 and was admitted to the hospital 2x (05/18/2023, 05/27/2023). His main concerns are LLE strength and gait impairments.. He has a hx of CP, but states he was walking fine prior to  CVA, but now must use a SPC. He reports stroke has mainly affected LLE. He notes L knee hyperext during gait and wants to work on this. Pt reports he also now has pain in LLE/foot with prolonged ambulation. Pt reports 1 fall since stroke that occurred outside when his foot "got tangled up in twigs." His balance is not very good without his cane. He is indep with ADLs but does have some trouble dressing. Pt reports some sleep difficulty since stroke. Pt had PT at [facility] for a week and then [facility].  PMH significant for dysphagia, CVA, tobacco use disorder (1 PDD), DMII with peripheral neuropathy, essential HTN, marijuana misuse, obstructive sleep apnea, recurrent major depressive disorder in partial remission, anxiety, CP  PAIN:  Are you having pain? Yes: NPRS scale: not rated/10 Pain location: LLE/L foot Pain description: ache Aggravating factors: weightbearing  Relieving factors: not reported  PRECAUTIONS: Fall  RED FLAGS: Bowel or bladder incontinence: Yes: dr aware     WEIGHT BEARING RESTRICTIONS: No  FALLS: Has patient fallen in last 6 months? Yes. Number of falls 1  LIVING ENVIRONMENT: Lives with: lives with their family Lives in: Other lives at father's house, lives in camper at father's house Stairs: Yes: External: 3 steps; none no handrails but grabs onto doorframe? Has following equipment at home: Single point cane  and Walker - 2 wheeled  PLOF: Independent  PATIENT GOALS:  I want to be able to walk normal or as close to normal as I can get. He wants more control of L knee.   OBJECTIVE:  Note: Objective measures were completed at Evaluation unless otherwise noted.  DIAGNOSTIC FINDINGS:   Via chart 05/27/23 [name], MD "ED Course: Upon presentation to the emergency room, BP was 160/96 with temperature 97.5 with otherwise normal vital signs.  Labs revealed unremarkable CMP.  CBC showed leukocytosis 12.4 with neutrophilia.  UA was negative.  Urine drug screen was positive for cannabinoids. EKG as reviewed by me : EKG showed sinus rhythm with a rate of 86 with nonspecific interventricular conduction delay and inferior Q waves. Imaging: Code stroke CT revealed old no acute intracranial abnormalities.  CTA of the head and neck revealed normal CT perfusion with no emergent large vessel occlusion or high-grade stenosis of the intracranial arteries.   The patient was not considered a candidate for tPA per teleneurology consult due to recent CVA.  He will be admitted to an observation medical telemetry [facility] for evaluation and management."   05/30/23 MR BRAIN " IMPRESSION: 1. Bilateral lower pontine abnormal diffusion, now more intense on the left side versus two days ago. Increased T2 hyperintensity with no hemorrhage or mass effect. No new areas of involvement. Small-vessel ischemic etiology is possible. Other etiologies of symmetric brain diffusion abnormality were considered (such as toxic exposure or myelinosis), but seem unlikely given the pattern here.   2. New acute intracranial abnormality. And otherwise mild nonspecific cerebral white matter signal changes.     Electronically Signed   By: Odessa Fleming M.D.   On: 05/30/2023 04:29"  COGNITION: Overall cognitive status: Within functional limits for tasks assessed   SENSATION: WFL BLE   COORDINATION: UE WFL  LE rapid alt movement  WNL, some difficulty performing testing due to LLE weakness  EDEMA:  Reports no swelling, none observed during eval   POSTURE: rounded shoulders, postural impairment with gait (see below)   LOWER EXTREMITY MMT:    Grossly 4+/5 BLE, but  LLE with greater  deficits, particularly hip flexors  Some pain with testing DF bilaterally   TRANSFERS: Assistive device utilized: Single point cane  Sit to stand: SBA Stand to sit: Complete Independence    GAIT: Gait pattern/comments:  Increased supination L foot/ankle during gait, decreased gait speed, L hip hike, decreased L foot clearance/lack of heel-strike Distance walked: , Assistive device utilized: Single point cane Level of assistance: Modified independence   FUNCTIONAL TESTS:  5 times sit to stand: 12.9 sec hands-free Timed up and go (TUG): 12.5 sec  6 minute walk test: 794 ft with SPC 10 meter walk test: 0.67 m/s  with [facility]  Berg Balance Scale: 53/56   PATIENT SURVEYS:  FOTO 57 (goal score 67)  TODAY'S TREATMENT:                                                                                                                              DATE: 09/30/23   Gait belt donned throughout for pt safety  TE: 2.5# AW each LE: -LAQ 2x15 each LE  STS 12x, 15x  Resisted gait 12.5#-17.5# FWD/BCKWD 10x  Resisted gait 12.5# 3x facing each way- some discomfort felt LLE/ankle, no pain with second set. Rates medium   Leg press 40# 16x bilat LE --LLE only 25# 2x10 rates medium   Treadmill endurance training with additional focus on heel-toe sequencing up to 1.5 mph x 5 min with BUE support    PATIENT EDUCATION: Education details: exercise technique Person educated: Patient Education method: Explanation Education comprehension: verbalized understanding  HOME EXERCISE PROGRAM:  Access Code: [email or web address] URL: [email or web address] Date: 09/28/2023 Prepared by: Temple Pacini  Exercises - Seated March  -  1-2 x daily - 5-6 x weekly - 2 sets - 10 reps - Sit to Stand  - 1-2 x daily - 5-6 x weekly - 2 sets - 12 reps - Standing Tandem Balance with Counter Support  - 1 x daily - 4-7 x weekly - 2 sets - 1 reps - 30 sec/leg hold - Standing Single Leg Stance with Counter Support  - 1 x daily - 4-7 x weekly - 2 sets - 1 reps - 30 sec/leg hold  GOALS: Goals reviewed with patient? Yes  SHORT TERM GOALS: Target date: 11/03/2023    Patient will be independent in home exercise program to improve strength/mobility for better functional independence with ADLs. Baseline: Goal status: INITIAL   LONG TERM GOALS: Target date: 12/15/2023    Patient will increase FOTO score to equal to or greater than  67   to demonstrate statistically significant improvement in mobility and quality of life.  Baseline: 57 Goal status: INITIAL  2.  Patient (<98 years old) will complete five times sit to stand test in < 10 seconds indicating an increased LE strength and improved balance. Baseline: 12.9 sec Goal status: INITIAL  3.  Patient will increase Berg Balance score by > 2 points to demonstrate decreased fall risk during functional activities Baseline:  53/56  Goal status: INITIAL/REVISED   4.  Patient will increase 10 meter walk test to >1.15m/s as to improve gait speed for better community ambulation and to reduce fall risk. Baseline: 0.67 m/s with SPC Goal status: INITIAL  5.  Patient will reduce timed up and go to <11 seconds to reduce fall risk and demonstrate improved transfer/gait ability. Baseline: 12.5 sec  Goal status: INITIAL  6.  Patient will increase six minute walk test distance to >1000 for progression to community ambulator and improve gait ability  Baseline: 794 ft with SPC Goal status: INITIAL   ASSESSMENT:  CLINICAL IMPRESSION: Pt tolerates majority of interventions well today and able to advance LE strength training. He did report mild LLE ankle discomfort with LTL resisted gait, but  this was not present with the following set, no other reports of pain/discomfort for remainder of session. Pt still quick to fatigue. The pt will benefit from further skilled PT to improve these deficits in order to increase QOL and return pt to PLOF.   OBJECTIVE IMPAIRMENTS: Abnormal gait, decreased balance, decreased coordination, decreased endurance, decreased mobility, difficulty walking, decreased ROM, decreased strength, improper body mechanics, postural dysfunction, and pain.   ACTIVITY LIMITATIONS: carrying, squatting, stairs, transfers, and locomotion level  PARTICIPATION LIMITATIONS: shopping, community activity, and yard work  PERSONAL FACTORS: 3+ comorbidities: PMH significant for dysphagia, CVA, tobacco use disorder (1 PDD), DMII with peripheral neuropathy, essential HTN, marijuana misuse, obstructive sleep apnea, recurrent major depressive disorder in partial remission, anxiety, CP  are also affecting patient's functional outcome.   REHAB POTENTIAL: Good  CLINICAL DECISION MAKING: Evolving/moderate complexity  EVALUATION COMPLEXITY: Moderate  PLAN:  PT FREQUENCY: 1-2x/week  PT DURATION: 12 weeks  PLANNED INTERVENTIONS: 97164- PT Re-evaluation, 97110-Therapeutic exercises, 97530- Therapeutic activity, 97112- Neuromuscular re-education, 97535- Self Care, 16109- Manual therapy, [phone]- Gait training, [phone]- Orthotic Fit/training, [phone]- Canalith repositioning, [phone]- Splinting, Patient/Family education, Balance training, Stair training, Taping, Dry Needling, Joint mobilization, Spinal mobilization, Vestibular training, DME instructions, Cryotherapy, and Biofeedback  PLAN FOR NEXT SESSION: SLB and tandem balance activities, STS/endurance activities    Baird Kay, PT 09/30/2023, 5:18 PM

## 2023-10-04 ENCOUNTER — Ambulatory Visit: Payer: Medicare HMO

## 2023-10-04 DIAGNOSIS — M6281 Muscle weakness (generalized): Secondary | ICD-10-CM

## 2023-10-04 DIAGNOSIS — R2681 Unsteadiness on feet: Secondary | ICD-10-CM

## 2023-10-04 DIAGNOSIS — R262 Difficulty in walking, not elsewhere classified: Secondary | ICD-10-CM | POA: Diagnosis not present

## 2023-10-04 DIAGNOSIS — R278 Other lack of coordination: Secondary | ICD-10-CM

## 2023-10-04 NOTE — Therapy (Signed)
OUTPATIENT PHYSICAL THERAPY NEURO TREATMENT   Patient Name: Bradley Morrison MRN: [id number] DOB:Jul 20, 1976, 47 y.o., male Today's Date: 10/04/2023   PCP: Karle Starch, MD  REFERRING PROVIDER: Erick Colace, MD   END OF SESSION:  PT End of Session - 10/04/23 1019     Visit Number 4    Number of Visits 25    Date for PT Re-Evaluation 12/15/23    PT Start Time 1017    PT Stop Time 1057    PT Time Calculation (min) 40 min    Equipment Utilized During Treatment Gait belt    Activity Tolerance Patient tolerated treatment well    Behavior During Therapy WFL for tasks assessed/performed              Past Medical History:  Diagnosis Date   Anxiety    CP (cerebral palsy) (HCC)    Hypertension    Stroke (HCC) 05/18/2023   History reviewed. No pertinent surgical history. 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) 06/02/2023   Acute ischemic stroke (HCC) 05/28/2023   Dyslipidemia 05/28/2023   Essential hypertension 05/28/2023   Type 2 diabetes mellitus with peripheral neuropathy (HCC) 05/28/2023   Marijuana abuse 05/28/2023   CVA (cerebral vascular accident) (HCC) 05/28/2023   Obstructive sleep apnea 03/05/2020   Recurrent major depressive disorder, in partial remission (HCC) 08/25/2018   Anxiety    Obesity (BMI 30-39.9) 10/31/2014   Hypertension associated with diabetes (HCC) 05/21/2014    ONSET DATE: 05/18/2023  REFERRING DIAG:  Diagnosis  I63.50 (ICD-10-CM) - 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 No other updates.  Pt accompanied by: self  PERTINENT HISTORY:   The pt is a pleasant 47 yo male referred to PT s/p CVA. Pt states he had "four strokes"  from 05/18/2023-05/27/2023 and was admitted to the hospital 2x (05/18/2023, 05/27/2023). His main concerns are LLE strength and gait impairments.. He has a hx of CP, but states he was walking fine prior to  CVA, but now must use a SPC. He reports stroke has mainly affected LLE. He notes L knee hyperext during gait and wants to work on this. Pt reports he also now has pain in LLE/foot with prolonged ambulation. Pt reports 1 fall since stroke that occurred outside when his foot "got tangled up in twigs." His balance is not very good without his cane. He is indep with ADLs but does have some trouble dressing. Pt reports some sleep difficulty since stroke. Pt had PT at [facility] for a week and then [facility].  PMH significant for dysphagia, CVA, tobacco use disorder (1 PDD), DMII with peripheral neuropathy, essential HTN, marijuana misuse, obstructive sleep apnea, recurrent major depressive disorder in partial remission, anxiety, CP  PAIN:  Are you having pain? Yes: NPRS scale: not rated/10 Pain location: LLE/L foot Pain description: ache Aggravating factors: weightbearing  Relieving factors: not reported  PRECAUTIONS: Fall  RED FLAGS: Bowel or bladder incontinence: Yes: dr aware     WEIGHT BEARING RESTRICTIONS: No  FALLS: Has patient fallen in last 6 months? Yes. Number of falls 1  LIVING ENVIRONMENT: Lives with: lives with their family Lives in: Other lives at father's house, lives in camper at father's house Stairs: Yes: External: 3 steps; none no handrails but grabs onto doorframe? Has following equipment at home: Single point cane and Walker - 2  wheeled  PLOF: Independent  PATIENT GOALS:  I want to be able to walk normal or as close to normal as I can get. He wants more control of L knee.   OBJECTIVE:  Note: Objective measures were completed at Evaluation unless otherwise noted.  DIAGNOSTIC FINDINGS:   Via chart 05/27/23 [name], MD "ED Course: Upon presentation to the emergency room, BP was 160/96 with temperature 97.5 with otherwise normal vital signs.  Labs revealed unremarkable CMP.  CBC showed leukocytosis 12.4 with neutrophilia.  UA was negative.  Urine drug screen was positive for cannabinoids. EKG as reviewed by me : EKG showed sinus rhythm with a rate of 86 with nonspecific interventricular conduction delay and inferior Q waves. Imaging: Code stroke CT revealed old no acute intracranial abnormalities.  CTA of the head and neck revealed normal CT perfusion with no emergent large vessel occlusion or high-grade stenosis of the intracranial arteries.   The patient was not considered a candidate for tPA per teleneurology consult due to recent CVA.  He will be admitted to an observation medical telemetry [facility] for evaluation and management."   05/30/23 MR BRAIN " IMPRESSION: 1. Bilateral lower pontine abnormal diffusion, now more intense on the left side versus two days ago. Increased T2 hyperintensity with no hemorrhage or mass effect. No new areas of involvement. Small-vessel ischemic etiology is possible. Other etiologies of symmetric brain diffusion abnormality were considered (such as toxic exposure or myelinosis), but seem unlikely given the pattern here.   2. New acute intracranial abnormality. And otherwise mild nonspecific cerebral white matter signal changes.     Electronically Signed   By: Odessa Fleming M.D.   On: 05/30/2023 04:29"  COGNITION: Overall cognitive status: Within functional limits for tasks assessed   SENSATION: WFL BLE   COORDINATION: UE WFL  LE rapid alt movement WNL, some  difficulty performing testing due to LLE weakness  EDEMA:  Reports no swelling, none observed during eval   POSTURE: rounded shoulders, postural impairment with gait (see below)   LOWER EXTREMITY MMT:    Grossly 4+/5 BLE, but  LLE with greater  deficits, particularly hip flexors  Some pain with testing DF bilaterally   TRANSFERS: Assistive device utilized: Single point cane  Sit to stand: SBA Stand to sit: Complete Independence    GAIT: Gait pattern/comments:  Increased supination L foot/ankle during gait, decreased gait speed, L hip hike, decreased L foot clearance/lack of heel-strike Distance walked: , Assistive device utilized: Single point cane Level of assistance: Modified independence   FUNCTIONAL TESTS:  5 times sit to stand: 12.9 sec hands-free Timed up and go (TUG): 12.5 sec  6 minute walk test: 794 ft with SPC 10 meter walk test: 0.67 m/s  with SPC  Berg Balance Scale: 53/56   PATIENT SURVEYS:  FOTO 57 (goal score 67)  TODAY'S TREATMENT:                                                                                                                              DATE: 10/04/23   Gait belt donned throughout for pt safety  TE:  Treadmill endurance training with additional focus on heel-toe sequencing up to 2.1 mph x6 min with BUE support, warm-up and cool-down minutes provided  Cable machine: Stand FWD step with heel strike and 2.5# 15x each LE  Resisted gait 17.5# FWD/BCKWD 6x Resisted gait 12.5# 6x facing each way- some discomfort felt LLE/ankle, no pain with second set. Rates medium  Comments: Pt rates resisted gait medium with LLE  Leg press  --individual LE 25# 3x12 rates medium      PATIENT EDUCATION: Education details: exercise technique Person educated: Patient Education method: Explanation Education comprehension: verbalized understanding  HOME EXERCISE PROGRAM:  Access Code: [email or web address] URL:  [email or web address] Date: 09/28/2023 Prepared by: Temple Pacini  Exercises - Seated March  - 1-2 x daily - 5-6 x weekly - 2 sets - 10 reps - Sit to Stand  - 1-2 x daily - 5-6 x weekly - 2 sets - 12 reps - Standing Tandem Balance with Counter Support  - 1 x daily - 4-7 x weekly - 2 sets - 1 reps - 30 sec/leg hold - Standing Single Leg Stance with Counter Support  - 1 x daily - 4-7 x weekly - 2 sets - 1 reps - 30 sec/leg hold  GOALS: Goals reviewed with patient? Yes  SHORT TERM GOALS: Target date: 11/03/2023    Patient will be independent in home exercise program to improve strength/mobility for better functional independence with ADLs. Baseline: Goal status: INITIAL   LONG TERM GOALS: Target date: 12/15/2023    Patient will increase FOTO score to equal to or greater than  67   to demonstrate statistically significant improvement in mobility and quality of life.  Baseline: 57 Goal status: INITIAL  2.  Patient (<6 years old) will complete five times sit to stand test in < 10 seconds indicating an increased LE strength and improved balance. Baseline: 12.9 sec Goal status: INITIAL  3.  Patient will increase Berg Balance score by >  2 points to demonstrate decreased fall risk during functional activities Baseline: 53/56  Goal status: INITIAL/REVISED   4.  Patient will increase 10 meter walk test to >1.24m/s as to improve gait speed for better community ambulation and to reduce fall risk. Baseline: 0.67 m/s with SPC Goal status: INITIAL  5.  Patient will reduce timed up and go to <11 seconds to reduce fall risk and demonstrate improved transfer/gait ability. Baseline: 12.5 sec  Goal status: INITIAL  6.  Patient will increase six minute walk test distance to >1000 for progression to community ambulator and improve gait ability  Baseline: 794 ft with SPC Goal status: INITIAL   ASSESSMENT:  CLINICAL IMPRESSION: Pt with observed gait mechanics/heel strike, and  self-report of improved knee control during gait. He continues to advance therex and tolerates these interventions well without pain. The pt will benefit from further skilled PT to improve these deficits in order to increase QOL and return pt to PLOF.   OBJECTIVE IMPAIRMENTS: Abnormal gait, decreased balance, decreased coordination, decreased endurance, decreased mobility, difficulty walking, decreased ROM, decreased strength, improper body mechanics, postural dysfunction, and pain.   ACTIVITY LIMITATIONS: carrying, squatting, stairs, transfers, and locomotion level  PARTICIPATION LIMITATIONS: shopping, community activity, and yard work  PERSONAL FACTORS: 3+ comorbidities: PMH significant for dysphagia, CVA, tobacco use disorder (1 PDD), DMII with peripheral neuropathy, essential HTN, marijuana misuse, obstructive sleep apnea, recurrent major depressive disorder in partial remission, anxiety, CP  are also affecting patient's functional outcome.   REHAB POTENTIAL: Good  CLINICAL DECISION MAKING: Evolving/moderate complexity  EVALUATION COMPLEXITY: Moderate  PLAN:  PT FREQUENCY: 1-2x/week  PT DURATION: 12 weeks  PLANNED INTERVENTIONS: 97164- PT Re-evaluation, 97110-Therapeutic exercises, 97530- Therapeutic activity, 97112- Neuromuscular re-education, 97535- Self Care, 16109- Manual therapy, [phone]- Gait training, [phone]- Orthotic Fit/training, [phone]- Canalith repositioning, [phone]- Splinting, Patient/Family education, Balance training, Stair training, Taping, Dry Needling, Joint mobilization, Spinal mobilization, Vestibular training, DME instructions, Cryotherapy, and Biofeedback  PLAN FOR NEXT SESSION: SLB and tandem balance activities, STS/endurance activities    Baird Kay, PT 10/04/2023, 11:02 AM

## 2023-10-06 ENCOUNTER — Ambulatory Visit: Payer: Medicare HMO

## 2023-10-06 DIAGNOSIS — R278 Other lack of coordination: Secondary | ICD-10-CM

## 2023-10-06 DIAGNOSIS — R42 Dizziness and giddiness: Secondary | ICD-10-CM

## 2023-10-06 DIAGNOSIS — M6281 Muscle weakness (generalized): Secondary | ICD-10-CM

## 2023-10-06 DIAGNOSIS — R2681 Unsteadiness on feet: Secondary | ICD-10-CM

## 2023-10-06 DIAGNOSIS — R262 Difficulty in walking, not elsewhere classified: Secondary | ICD-10-CM

## 2023-10-06 NOTE — Therapy (Signed)
OUTPATIENT PHYSICAL THERAPY NEURO TREATMENT   Patient Name: Bradley Morrison MRN: [id number] DOB:January 26, 1976, 47 y.o., male Today's Date: 10/06/2023   PCP: Karle Starch, MD  REFERRING PROVIDER: Erick Colace, MD   END OF SESSION:  PT End of Session - 10/06/23 1211     Visit Number 5    Number of Visits 25    Date for PT Re-Evaluation 12/15/23    PT Start Time 1105    PT Stop Time 1147    PT Time Calculation (min) 42 min    Equipment Utilized During Treatment Gait belt    Activity Tolerance Patient tolerated treatment well    Behavior During Therapy WFL for tasks assessed/performed               Past Medical History:  Diagnosis Date   Anxiety    CP (cerebral palsy) (HCC)    Hypertension    Stroke (HCC) 05/18/2023   History reviewed. No pertinent surgical history. 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) 06/02/2023   Acute ischemic stroke (HCC) 05/28/2023   Dyslipidemia 05/28/2023   Essential hypertension 05/28/2023   Type 2 diabetes mellitus with peripheral neuropathy (HCC) 05/28/2023   Marijuana abuse 05/28/2023   CVA (cerebral vascular accident) (HCC) 05/28/2023   Obstructive sleep apnea 03/05/2020   Recurrent major depressive disorder, in partial remission (HCC) 08/25/2018   Anxiety    Obesity (BMI 30-39.9) 10/31/2014   Hypertension associated with diabetes (HCC) 05/21/2014    ONSET DATE: 05/18/2023  REFERRING DIAG:  Diagnosis  I63.50 (ICD-10-CM) - 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 He reports some foot pain after last appointment and  then again after activity yesterday. Reports pain is near ankle joint (pt reports he has had this since stroke). Pt has noticed knee buckling going up hill. No other updates.  Pt accompanied by: self  PERTINENT HISTORY:   The pt is a pleasant 47 yo male referred to PT s/p CVA.  Pt states he had "four strokes"  from 05/18/2023-05/27/2023 and was admitted to the hospital 2x (05/18/2023, 05/27/2023). His main concerns are LLE strength and gait impairments.. He has a hx of CP, but states he was walking fine prior to CVA, but now must use a SPC. He reports stroke has mainly affected LLE. He notes L knee hyperext during gait and wants to work on this. Pt reports he also now has pain in LLE/foot with prolonged ambulation. Pt reports 1 fall since stroke that occurred outside when his foot "got tangled up in twigs." His balance is not very good without his cane. He is indep with ADLs but does have some trouble dressing. Pt reports some sleep difficulty since stroke. Pt had PT at [facility] for a week and then [facility] PT.  PMH significant for dysphagia, CVA, tobacco use disorder (1 PDD), DMII with peripheral neuropathy, essential HTN, marijuana misuse, obstructive sleep apnea, recurrent major depressive disorder in partial remission, anxiety, CP  PAIN:  Are you having pain? Yes: NPRS scale: not rated/10 Pain location: LLE/L foot Pain description: ache Aggravating factors: weightbearing  Relieving factors: not reported  PRECAUTIONS: Fall  RED FLAGS: Bowel or bladder incontinence: Yes: dr aware     WEIGHT BEARING RESTRICTIONS: No  FALLS: Has patient fallen in last 6 months? Yes. Number of falls 1  LIVING ENVIRONMENT: Lives with: lives with  their family Lives in: Other lives at father's house, lives in camper at father's house Stairs: Yes: External: 3 steps; none no handrails but grabs onto doorframe? Has following equipment at home: Single point cane and Walker - 2 wheeled  PLOF: Independent  PATIENT GOALS:  I want to be able to walk normal or as close to normal as I can get. He wants more control of L knee.   OBJECTIVE:  Note: Objective measures were completed at Evaluation unless otherwise noted.  DIAGNOSTIC FINDINGS:   Via chart 05/27/23 [name], MD "ED Course: Upon presentation to the emergency room, BP was 160/96 with temperature 97.5 with otherwise normal vital signs.  Labs revealed unremarkable CMP.  CBC showed leukocytosis 12.4 with neutrophilia.  UA was negative.  Urine drug screen was positive for cannabinoids. EKG as reviewed by me : EKG showed sinus rhythm with a rate of 86 with nonspecific interventricular conduction delay and inferior Q waves. Imaging: Code stroke CT revealed old no acute intracranial abnormalities.  CTA of the head and neck revealed normal CT perfusion with no emergent large vessel occlusion or high-grade stenosis of the intracranial arteries.   The patient was not considered a candidate for tPA per teleneurology consult due to recent CVA.  He will be admitted to an observation medical telemetry [facility] for evaluation and management."   05/30/23 MR BRAIN " IMPRESSION: 1. Bilateral lower pontine abnormal diffusion, now more intense on the left side versus two days ago. Increased T2 hyperintensity with no hemorrhage or mass effect. No new areas of involvement. Small-vessel ischemic etiology is possible. Other etiologies of symmetric brain diffusion abnormality were considered (such as toxic exposure or myelinosis), but seem unlikely given the pattern here.   2. New acute intracranial abnormality. And otherwise mild nonspecific cerebral white matter signal changes.      Electronically Signed   By: Odessa Fleming M.D.   On: 05/30/2023 04:29"  COGNITION: Overall cognitive status: Within functional limits for tasks assessed   SENSATION: WFL BLE   COORDINATION: UE WFL  LE rapid alt movement WNL, some difficulty performing testing  due to LLE weakness  EDEMA:  Reports no swelling, none observed during eval   POSTURE: rounded shoulders, postural impairment with gait (see below)   LOWER EXTREMITY MMT:    Grossly 4+/5 BLE, but  LLE with greater deficits, particularly hip flexors  Some pain with testing DF bilaterally   TRANSFERS: Assistive device utilized: Single point cane  Sit to stand: SBA Stand to sit: Complete Independence    GAIT: Gait pattern/comments:  Increased supination L foot/ankle during gait, decreased gait speed, L hip hike, decreased L foot clearance/lack of heel-strike Distance walked: , Assistive device utilized: Single point cane Level of assistance: Modified independence   FUNCTIONAL TESTS:  5 times sit to stand: 12.9 sec hands-free Timed up and go (TUG): 12.5 sec  6 minute walk test: 794 ft with SPC 10 meter walk test: 0.67 m/s  with [facility]  Berg Balance Scale: 53/56   PATIENT SURVEYS:  FOTO 57 (goal score 67)  TODAY'S TREATMENT:                                                                                                                              DATE: 10/06/23   Gait belt donned throughout for pt safety   NMR: In // bars Half-foam (2) obstacle clearance with decreasing levels of UE support to no UE support - multiple reps completed  Orange hurdle foot clearance with full step over and LLE step over only - multiple reps Comments: challenging for LLE, cuing throughout for technique  Gait with vertical and horizontal head turns in // bars x 6 min  - slight dizziness and mild instability with intervention. This improved with seated rest/resolved.  TE:  Standing march 10x with 3 sec hold/rep each  LE Seated DF with half-foam 2x15 1-3 sec hold/rep   Treadmill gait trainer up to 1.8 mph - analysis of gait Avg step length: R and L both 0.5 meters Time on each foot R 51%, L 49% Pt completes total of 6 min - moderately challenging. L toe drag intermittent throughout  Seated dynadisc ankle inv/ev 20x each LE - challenge with eversion LLE     PATIENT EDUCATION: Education details: exercise technique Person educated: Patient Education method: Explanation Education comprehension: verbalized understanding  HOME EXERCISE PROGRAM:  Updated 10/06/23 Access Code: [email or web address] URL: [email or web address] Date: 10/06/2023 Prepared by: Temple Pacini  Exercises - Seated March  - 1-2 x daily - 5-6 x weekly - 2 sets - 10 reps - Sit to Stand  - 1-2 x daily - 5-6 x weekly - 2 sets - 12 reps - Standing Tandem Balance with Counter Support  - 1 x daily - 4-7 x weekly - 2 sets - 1 reps - 30 sec/leg hold - Standing Single Leg Stance with Counter Support  - 1 x daily - 4-7 x weekly - 2 sets - 1 reps - 30 sec/leg hold - Seated Toe Raise  - 1 x daily - 7 x  weekly - 2 sets - 15-20 reps - 2-3 sec hold/rep hold  GOALS: Goals reviewed with patient? Yes  SHORT TERM GOALS: Target date: 11/03/2023    Patient will be independent in home exercise program to improve strength/mobility for better functional independence with ADLs. Baseline: Goal status: INITIAL   LONG TERM GOALS: Target date: 12/15/2023    Patient will increase FOTO score to equal to or greater than  67   to demonstrate statistically significant improvement in mobility and quality of life.  Baseline: 57 Goal status: INITIAL  2.  Patient (<64 years old) will complete five times sit to stand test in < 10 seconds indicating an increased LE strength and improved balance. Baseline: 12.9 sec Goal status: INITIAL  3.  Patient will increase Berg Balance score by > 2 points to demonstrate decreased fall risk during functional  activities Baseline: 53/56  Goal status: INITIAL/REVISED   4.  Patient will increase 10 meter walk test to >1.51m/s as to improve gait speed for better community ambulation and to reduce fall risk. Baseline: 0.67 m/s with SPC Goal status: INITIAL  5.  Patient will reduce timed up and go to <11 seconds to reduce fall risk and demonstrate improved transfer/gait ability. Baseline: 12.5 sec  Goal status: INITIAL  6.  Patient will increase six minute walk test distance to >1000 for progression to community ambulator and improve gait ability  Baseline: 794 ft with SPC Goal status: INITIAL   ASSESSMENT:  CLINICAL IMPRESSION: Initiated obstacle clearance training with emphasis on DF and interventions to target ankle mobility. Pt initially struggled with obstacle clearance, but did show some within-session improvement. Pt also found to have balance deficit and some dizziness when trying to ambulate with head turns. Dizziness resolved with rest. Plan to practice these activities again next visit. The pt will benefit from further skilled PT to improve these deficits in order to increase QOL and return pt to PLOF.   OBJECTIVE IMPAIRMENTS: Abnormal gait, decreased balance, decreased coordination, decreased endurance, decreased mobility, difficulty walking, decreased ROM, decreased strength, improper body mechanics, postural dysfunction, and pain.   ACTIVITY LIMITATIONS: carrying, squatting, stairs, transfers, and locomotion level  PARTICIPATION LIMITATIONS: shopping, community activity, and yard work  PERSONAL FACTORS: 3+ comorbidities: PMH significant for dysphagia, CVA, tobacco use disorder (1 PDD), DMII with peripheral neuropathy, essential HTN, marijuana misuse, obstructive sleep apnea, recurrent major depressive disorder in partial remission, anxiety, CP  are also affecting patient's functional outcome.   REHAB POTENTIAL: Good  CLINICAL DECISION MAKING: Evolving/moderate  complexity  EVALUATION COMPLEXITY: Moderate  PLAN:  PT FREQUENCY: 1-2x/week  PT DURATION: 12 weeks  PLANNED INTERVENTIONS: 97164- PT Re-evaluation, 97110-Therapeutic exercises, 97530- Therapeutic activity, 97112- Neuromuscular re-education, 97535- Self Care, 16109- Manual therapy, [phone]- Gait training, [phone]- Orthotic Fit/training, [phone]- Canalith repositioning, [phone]- Splinting, Patient/Family education, Balance training, Stair training, Taping, Dry Needling, Joint mobilization, Spinal mobilization, Vestibular training, DME instructions, Cryotherapy, and Biofeedback  PLAN FOR NEXT SESSION: SLB and tandem balance activities, STS/endurance activities, gait with head turns, obstacle clearance    Baird Kay, PT 10/06/2023, 12:12 PM

## 2023-10-11 ENCOUNTER — Ambulatory Visit: Payer: Medicare HMO | Admitting: Adult Health

## 2023-10-11 ENCOUNTER — Ambulatory Visit: Payer: Medicare HMO

## 2023-10-11 DIAGNOSIS — M6281 Muscle weakness (generalized): Secondary | ICD-10-CM

## 2023-10-11 DIAGNOSIS — R262 Difficulty in walking, not elsewhere classified: Secondary | ICD-10-CM | POA: Diagnosis not present

## 2023-10-11 DIAGNOSIS — R2681 Unsteadiness on feet: Secondary | ICD-10-CM

## 2023-10-11 DIAGNOSIS — R278 Other lack of coordination: Secondary | ICD-10-CM

## 2023-10-11 NOTE — Therapy (Signed)
OUTPATIENT PHYSICAL THERAPY NEURO TREATMENT   Patient Name: Bradley Morrison MRN: [id number] DOB:22-Aug-1976, 47 y.o., male Today's Date: 10/11/2023   PCP: Karle Starch, MD  REFERRING PROVIDER: Erick Colace, MD   END OF SESSION:  PT End of Session - 10/11/23 1015     Visit Number 6    Number of Visits 25    Date for PT Re-Evaluation 12/15/23    PT Start Time 1020    PT Stop Time 1100    PT Time Calculation (min) 40 min    Equipment Utilized During Treatment Gait belt    Activity Tolerance Patient tolerated treatment well    Behavior During Therapy WFL for tasks assessed/performed               Past Medical History:  Diagnosis Date   Anxiety    CP (cerebral palsy) (HCC)    Hypertension    Stroke (HCC) 05/18/2023   History reviewed. No pertinent surgical history. 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) 06/02/2023   Acute ischemic stroke (HCC) 05/28/2023   Dyslipidemia 05/28/2023   Essential hypertension 05/28/2023   Type 2 diabetes mellitus with peripheral neuropathy (HCC) 05/28/2023   Marijuana abuse 05/28/2023   CVA (cerebral vascular accident) (HCC) 05/28/2023   Obstructive sleep apnea 03/05/2020   Recurrent major depressive disorder, in partial remission (HCC) 08/25/2018   Anxiety    Obesity (BMI 30-39.9) 10/31/2014   Hypertension associated with diabetes (HCC) 05/21/2014    ONSET DATE: 05/18/2023  REFERRING DIAG:  Diagnosis  I63.50 (ICD-10-CM) - 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 He reports is occurred when he was out with his dog, he leaned against a tree and fell over. Pt reports some soreness today on his R side, does say some on the L too. He says it was easy to get back up.  Pt accompanied by: self  PERTINENT HISTORY:   The pt is a pleasant 47 yo male referred to PT s/p CVA. Pt states he  had "four strokes"  from 05/18/2023-05/27/2023 and was admitted to the hospital 2x (05/18/2023, 05/27/2023). His main concerns are LLE strength and gait impairments.. He has a hx of CP, but states he was walking fine prior to CVA, but now must use a SPC. He reports stroke has mainly affected LLE. He notes L knee hyperext during gait and wants to work on this. Pt reports he also now has pain in LLE/foot with prolonged ambulation. Pt reports 1 fall since stroke that occurred outside when his foot "got tangled up in twigs." His balance is not very good without his cane. He is indep with ADLs but does have some trouble dressing. Pt reports some sleep difficulty since stroke. Pt had PT at [facility] for a week and then [facility].  PMH significant for dysphagia, CVA, tobacco use disorder (1 PDD), DMII with peripheral neuropathy, essential HTN, marijuana misuse, obstructive sleep apnea, recurrent major depressive disorder in partial remission, anxiety, CP  PAIN:  Are you having pain? Yes: NPRS scale: not rated/10 Pain location: LLE/L foot Pain description: ache Aggravating factors: weightbearing  Relieving factors: not reported  PRECAUTIONS: Fall  RED FLAGS: Bowel or bladder incontinence: Yes: dr aware     WEIGHT BEARING RESTRICTIONS: No  FALLS: Has patient fallen in last 6 months? Yes. Number of falls 1  LIVING ENVIRONMENT: Lives with: lives with their family Lives in: Other lives at father's house, lives in  camper at father's house Stairs: Yes: External: 3 steps; none no handrails but grabs onto doorframe? Has following equipment at home: Single point cane and Walker - 2 wheeled  PLOF: Independent  PATIENT GOALS:  I want to be able to walk normal or as close to normal as I can get. He wants more control of L knee.   OBJECTIVE:  Note: Objective measures were completed at Evaluation unless otherwise noted.  DIAGNOSTIC FINDINGS:   Via chart 05/27/23 [name], MD "ED Course: Upon presentation to the emergency room, BP was 160/96 with temperature 97.5 with otherwise normal vital signs.  Labs revealed unremarkable CMP.  CBC showed leukocytosis 12.4 with neutrophilia.  UA was negative.  Urine drug screen was positive for cannabinoids. EKG as reviewed by me : EKG showed sinus rhythm with a rate of 86 with nonspecific interventricular conduction delay and inferior Q waves. Imaging: Code stroke CT revealed old no acute intracranial abnormalities.  CTA of the head and neck revealed normal CT perfusion with no emergent large vessel occlusion or high-grade stenosis of the intracranial arteries.   The patient was not considered a candidate for tPA per teleneurology consult due to recent CVA.  He will be admitted to an observation medical telemetry [facility] for evaluation and management."   05/30/23 MR BRAIN " IMPRESSION: 1. Bilateral lower pontine abnormal diffusion, now more intense on the left side versus two days ago. Increased T2 hyperintensity with no hemorrhage or mass effect. No new areas of involvement. Small-vessel ischemic etiology is possible. Other etiologies of symmetric brain diffusion abnormality were considered (such as toxic exposure or myelinosis), but seem unlikely given the pattern here.   2. New acute intracranial abnormality. And otherwise mild nonspecific cerebral white matter signal changes.     Electronically Signed   By: Odessa Fleming M.D.   On: 05/30/2023  04:29"  COGNITION: Overall cognitive status: Within functional limits for tasks assessed   SENSATION: WFL BLE   COORDINATION: UE WFL  LE rapid alt movement WNL, some difficulty performing testing due to LLE  weakness  EDEMA:  Reports no swelling, none observed during eval   POSTURE: rounded shoulders, postural impairment with gait (see below)   LOWER EXTREMITY MMT:    Grossly 4+/5 BLE, but  LLE with greater deficits, particularly hip flexors  Some pain with testing DF bilaterally   TRANSFERS: Assistive device utilized: Single point cane  Sit to stand: SBA Stand to sit: Complete Independence    GAIT: Gait pattern/comments:  Increased supination L foot/ankle during gait, decreased gait speed, L hip hike, decreased L foot clearance/lack of heel-strike Distance walked: , Assistive device utilized: Single point cane Level of assistance: Modified independence   FUNCTIONAL TESTS:  5 times sit to stand: 12.9 sec hands-free Timed up and go (TUG): 12.5 sec  6 minute walk test: 794 ft with SPC 10 meter walk test: 0.67 m/s  with [facility]  Berg Balance Scale: 53/56   PATIENT SURVEYS:  FOTO 57 (goal score 67)  TODAY'S TREATMENT:                                                                                                                              DATE: 10/11/23   Gait belt donned throughout for pt safety  TE:  Leg press  --individual LE 25# 1x15 rates easy --second set with 40# 1x15, 1x8 each LE. Rates a bit more than a medium  Cable machine- Resisted gait 17.5#-22.5# FWD/BCKWD 5x Resisted gait 17.5# 5x facing each way- some discomfort felt LLE/ankle, no pain with second set. Rates medium  Comments: able to progress weight, rest break provided to improve ankle discomfort   NMR: gait belt donned Several minutes of the following interventions: Gait reading sticky notes with head turns (mix of vertical and horizontal) Purely vertical head turns 2x80  ft Purely horizontal head turns 2x80 ft - mild deviations in gait stability  WBOS EC on airex 2x30 sec NBOS EC on airex 2x30 sec  Tandem gait on airex beam with UE support x multiple reps LTL stepping on airex beam intermittent UE support 4x each way      PATIENT EDUCATION: Education details: exercise technique Person educated: Patient Education method: Explanation Education comprehension: verbalized understanding  HOME EXERCISE PROGRAM:  Updated 10/06/23 Access Code: [email or web address] URL: [email or web address] Date: 10/06/2023 Prepared by: Temple Pacini  Exercises - Seated March  - 1-2 x daily - 5-6 x weekly - 2 sets - 10 reps - Sit to Stand  - 1-2 x daily - 5-6 x weekly - 2 sets - 12 reps - Standing Tandem Balance with Counter Support  - 1 x daily - 4-7 x weekly - 2 sets - 1 reps - 30 sec/leg hold - Standing Single Leg Stance with Counter Support  - 1 x daily - 4-7 x weekly - 2 sets - 1 reps - 30 sec/leg hold - Seated Toe Raise  - 1 x daily - 7 x weekly - 2 sets - 15-20 reps - 2-3 sec hold/rep hold  GOALS: Goals reviewed with patient? Yes  SHORT TERM GOALS: Target date: 11/03/2023    Patient will be independent in home exercise program to improve strength/mobility for better functional independence with ADLs. Baseline: Goal status: INITIAL   LONG TERM GOALS: Target date: 12/15/2023    Patient will increase FOTO score to equal to or greater than  67   to demonstrate statistically significant improvement in mobility and quality of life.  Baseline: 57 Goal status: INITIAL  2.  Patient (<69 years old) will complete five times sit to stand test in < 10 seconds indicating an increased LE strength and improved balance. Baseline: 12.9 sec Goal status: INITIAL  3.  Patient will increase Berg Balance score by > 2 points to demonstrate decreased fall risk during functional activities Baseline: 53/56  Goal status: INITIAL/REVISED   4.  Patient will increase 10  meter walk test to >1.56m/s as to improve gait speed for better community ambulation and to reduce fall risk. Baseline: 0.67 m/s with SPC Goal status: INITIAL  5.  Patient will reduce timed up and go to <11 seconds to reduce fall risk and demonstrate improved transfer/gait ability. Baseline: 12.5 sec  Goal status: INITIAL  6.  Patient will increase six minute walk test distance to >1000 for progression to community ambulator and improve gait ability  Baseline: 794 ft with SPC Goal status: INITIAL   ASSESSMENT:  CLINICAL IMPRESSION: Pt shows progress AEB increasing level of resistance with therex and completing higher level balance activities with no greater than intermittent UE to min assist. Will continue to progress as pt able. The pt will benefit from further skilled PT to improve these deficits in order to increase QOL and return pt to PLOF.   OBJECTIVE IMPAIRMENTS: Abnormal gait, decreased balance, decreased coordination, decreased endurance, decreased mobility, difficulty walking, decreased ROM, decreased strength, improper body mechanics, postural dysfunction, and pain.   ACTIVITY LIMITATIONS: carrying, squatting, stairs, transfers, and locomotion level  PARTICIPATION LIMITATIONS: shopping, community activity, and yard work  PERSONAL FACTORS: 3+ comorbidities: PMH significant for dysphagia, CVA, tobacco use disorder (1 PDD), DMII with peripheral neuropathy, essential HTN, marijuana misuse, obstructive sleep apnea, recurrent major depressive disorder in partial remission, anxiety, CP  are also affecting patient's functional outcome.   REHAB POTENTIAL: Good  CLINICAL DECISION MAKING: Evolving/moderate complexity  EVALUATION COMPLEXITY: Moderate  PLAN:  PT FREQUENCY: 1-2x/week  PT DURATION: 12 weeks  PLANNED INTERVENTIONS: 97164- PT Re-evaluation, 97110-Therapeutic exercises, 97530- Therapeutic activity, 97112- Neuromuscular re-education, 97535- Self Care, 16109- Manual  therapy, [phone]- Gait training, [phone]- Orthotic Fit/training, [phone]- Canalith repositioning, [phone]- Splinting, Patient/Family education, Balance training, Stair training, Taping, Dry Needling, Joint mobilization, Spinal mobilization, Vestibular training, DME instructions, Cryotherapy, and Biofeedback  PLAN FOR NEXT SESSION: SLB and tandem balance activities, STS/endurance activities, gait with head turns, obstacle clearance    Baird Kay, PT 10/11/2023, 12:44 PM

## 2023-10-18 ENCOUNTER — Ambulatory Visit: Payer: Medicare HMO

## 2023-10-21 ENCOUNTER — Ambulatory Visit: Payer: 59 | Attending: Physical Medicine & Rehabilitation

## 2023-10-21 DIAGNOSIS — M6281 Muscle weakness (generalized): Secondary | ICD-10-CM | POA: Diagnosis present

## 2023-10-21 DIAGNOSIS — I6350 Cerebral infarction due to unspecified occlusion or stenosis of unspecified cerebral artery: Secondary | ICD-10-CM | POA: Diagnosis present

## 2023-10-21 DIAGNOSIS — R278 Other lack of coordination: Secondary | ICD-10-CM | POA: Insufficient documentation

## 2023-10-21 DIAGNOSIS — R42 Dizziness and giddiness: Secondary | ICD-10-CM | POA: Insufficient documentation

## 2023-10-21 DIAGNOSIS — R2681 Unsteadiness on feet: Secondary | ICD-10-CM | POA: Insufficient documentation

## 2023-10-21 DIAGNOSIS — R262 Difficulty in walking, not elsewhere classified: Secondary | ICD-10-CM | POA: Insufficient documentation

## 2023-10-21 NOTE — Therapy (Signed)
 OUTPATIENT PHYSICAL THERAPY NEURO TREATMENT   Patient Name: Bradley Morrison MRN: [id number] DOB:01-06-1976, 48 y.o., male Today's Date: 10/21/2023   PCP: Myra Wanda LABOR, MD  REFERRING PROVIDER: Carilyn Prentice BRAVO, MD   END OF SESSION:  PT End of Session - 10/21/23 1308     Visit Number 7    Number of Visits 25    Date for PT Re-Evaluation 12/15/23    PT Start Time 1313    PT Stop Time 1357    PT Time Calculation (min) 44 min    Equipment Utilized During Treatment Gait belt    Activity Tolerance Patient tolerated treatment well;No increased pain    Behavior During Therapy WFL for tasks assessed/performed               Past Medical History:  Diagnosis Date   Anxiety    CP (cerebral palsy) (HCC)    Hypertension    Stroke (HCC) 05/18/2023   History reviewed. No pertinent surgical history. 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) 06/02/2023   Acute ischemic stroke (HCC) 05/28/2023   Dyslipidemia 05/28/2023   Essential hypertension 05/28/2023   Type 2 diabetes mellitus with peripheral neuropathy (HCC) 05/28/2023   Marijuana abuse 05/28/2023   CVA (cerebral vascular accident) (HCC) 05/28/2023   Obstructive sleep apnea 03/05/2020   Recurrent major depressive disorder, in partial remission (HCC) 08/25/2018   Anxiety    Obesity (BMI 30-39.9) 10/31/2014   Hypertension associated with diabetes (HCC) 05/21/2014    ONSET DATE: 05/18/2023  REFERRING DIAG:  Diagnosis  I63.50 (ICD-10-CM) - 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 He says now for the most part he doesn't use it unless he is really tired. He reports doing some yard work over the past few days. He reports his feet did hurt after this and took a few hours to feel better. He reports one fall on uneven spot in yard. PT encourages pt to use SPC in yard/over uneven surfaces to prevent fall. Pt agreeable to this plan. Pt accompanied  by: self  PERTINENT HISTORY:   The pt is a pleasant 49 yo male referred to PT s/p CVA. Pt states he had four strokes  from 05/18/2023-05/27/2023 and was admitted to the hospital 2x (05/18/2023, 05/27/2023). His main concerns are LLE strength and gait impairments.. He has a hx of CP, but states he was walking fine prior to CVA, but now must use a SPC. He reports stroke has mainly affected LLE. He notes L knee hyperext during gait and wants to work on this. Pt reports he also now has pain in LLE/foot with prolonged ambulation. Pt reports 1 fall since stroke that occurred outside when his foot got tangled up in twigs. His balance is not very good without his cane. He is indep with ADLs but does have some trouble dressing. Pt reports some sleep difficulty since stroke. Pt had PT at [facility] for a week and then [facility].  PMH significant for dysphagia, CVA, tobacco use disorder (1 PDD), DMII with peripheral neuropathy, essential HTN, marijuana misuse, obstructive sleep apnea, recurrent major depressive disorder in partial remission, anxiety, CP  PAIN:  Are you having pain? Yes: NPRS scale: not rated/10 Pain location: LLE/L foot Pain description: ache Aggravating factors: weightbearing  Relieving factors: not reported  PRECAUTIONS: Fall  RED FLAGS: Bowel or bladder incontinence: Yes: dr aware     WEIGHT BEARING RESTRICTIONS: No  FALLS: Has patient fallen in last 6  months? Yes. Number of falls 1  LIVING ENVIRONMENT: Lives with: lives with their family Lives in: Other lives at father's house, lives in camper at father's house Stairs: Yes: External: 3 steps; none no handrails but grabs onto doorframe? Has following equipment at home: Single point cane and Walker - 2 wheeled  PLOF: Independent  PATIENT GOALS:  I want to be able to walk normal or as close to normal as I can get. He wants more control of L knee.   OBJECTIVE:  Note: Objective measures were completed at Evaluation unless otherwise noted.  DIAGNOSTIC FINDINGS:   Via chart 05/27/23 Trudy Anthony HERO, MD ED Course: Upon presentation to the emergency room, BP was 160/96 with temperature 97.5 with otherwise normal vital signs.  Labs revealed unremarkable CMP.  CBC showed leukocytosis 12.4 with neutrophilia.  UA was negative.  Urine drug screen was positive for cannabinoids. EKG as reviewed by me : EKG showed sinus rhythm with a rate of 86 with nonspecific interventricular conduction delay and inferior Q waves. Imaging: Code stroke CT revealed old no acute intracranial abnormalities.  CTA of the head and neck revealed normal CT perfusion with no emergent large vessel occlusion or high-grade stenosis of the intracranial arteries.   The patient was not considered a candidate for tPA per teleneurology consult due to recent CVA.  He will be admitted to an observation medical telemetry [facility] for evaluation and management.   05/30/23 MR BRAIN  IMPRESSION: 1. Bilateral lower pontine abnormal diffusion, now more intense on the left side versus two days ago. Increased T2 hyperintensity with no hemorrhage or mass effect. No new areas of involvement. Small-vessel ischemic etiology is possible. Other etiologies of symmetric brain diffusion abnormality were considered (such as toxic exposure or myelinosis), but seem unlikely given the pattern here.   2. New acute intracranial abnormality. And  otherwise mild nonspecific cerebral white matter signal changes.     Electronically Signed   By: VEAR Hurst M.D.   On: 05/30/2023 04:29  COGNITION: Overall cognitive status: Within functional limits for tasks assessed  SENSATION: WFL BLE   COORDINATION: UE WFL  LE rapid alt movement WNL, some difficulty performing testing due to LLE weakness  EDEMA:  Reports no swelling, none observed during eval   POSTURE: rounded shoulders, postural impairment with gait (see below)   LOWER EXTREMITY MMT:    Grossly 4+/5 BLE, but  LLE with greater deficits, particularly hip flexors  Some pain with testing DF bilaterally   TRANSFERS: Assistive device utilized: Single point cane  Sit to stand: SBA Stand to sit: Complete Independence    GAIT: Gait pattern/comments:  Increased supination L foot/ankle during gait, decreased gait speed, L hip hike, decreased L foot clearance/lack of heel-strike Distance walked: , Assistive device utilized: Single point cane Level of assistance: Modified independence   FUNCTIONAL TESTS:  5 times sit to stand: 12.9 sec hands-free Timed up and go (TUG): 12.5 sec  6 minute walk test: 794 ft with SPC 10 meter walk test: 0.67 m/s  with SPC  Berg Balance Scale: 53/56   PATIENT SURVEYS:  FOTO 57 (goal score 67)  TODAY'S TREATMENT:                                                                                                                              DATE: 10/21/23   TE:  Treadmill endurance training with additional focus on heel-toe sequencing up to 2.1 mph x5 min with BUE support, warm-up and cool-down minutes provided  -increased LLE supination throughout  Ankle circles each LE  - CW/CC 10x for each  ankle alphabet each LE x 1 round Ankle rocker board pronation and supination each LE 10x for each motion Dynadisc pronation/supination 10x each LE Dynadisc ankle clocks CW/CC 10x CW and CC each LE Comment: pt with difficult both LE,  but notably greater decrease in mobility with L ankle  RTB pronation/supination on dyandisc  10x for each each LE  Heel raise with holding ball between bilat LE to prevent excessive supination 2x15 rates medium  Leg press  --individual LE 25# 2x15, 1x10 each LE  Bathroom break x 3 min   PATIENT EDUCATION: Education details: exercise technique Person educated: Patient Education method: Explanation Education comprehension: verbalized understanding  HOME EXERCISE PROGRAM:  Updated 10/06/23 Access Code: [email or web address] URL: [email or web address] Date: 10/06/2023 Prepared by: Darryle Patten  Exercises - Seated March  - 1-2 x daily - 5-6 x weekly - 2 sets - 10 reps - Sit to Stand  - 1-2 x daily - 5-6 x weekly - 2 sets - 12 reps - Standing Tandem Balance with Counter Support  - 1 x daily - 4-7 x weekly - 2 sets - 1 reps - 30 sec/leg hold - Standing Single Leg Stance with Counter Support  - 1 x daily - 4-7 x weekly - 2 sets - 1 reps - 30 sec/leg hold - Seated Toe Raise  - 1 x daily - 7 x weekly - 2 sets - 15-20 reps - 2-3 sec  hold/rep hold  GOALS: Goals reviewed with patient? Yes  SHORT TERM GOALS: Target date: 11/03/2023    Patient will be independent in home exercise program to improve strength/mobility for better functional independence with ADLs. Baseline: Goal status: INITIAL   LONG TERM GOALS: Target date: 12/15/2023    Patient will increase FOTO score to equal to or greater than  67   to demonstrate statistically significant improvement in mobility and quality of life.  Baseline: 57 Goal status: INITIAL  2.  Patient (<57 years old) will complete five times sit to stand test in < 10 seconds indicating an increased LE strength and improved balance. Baseline: 12.9 sec Goal status: INITIAL  3.  Patient will increase Berg Balance score by > 2 points to demonstrate decreased fall risk during functional activities Baseline: 53/56  Goal status: INITIAL/REVISED    4.  Patient will increase 10 meter walk test to >1.26m/s as to improve gait speed for better community ambulation and to reduce fall risk. Baseline: 0.67 m/s with SPC Goal status: INITIAL  5.  Patient will reduce timed up and go to <11 seconds to reduce fall risk and demonstrate improved transfer/gait ability. Baseline: 12.5 sec  Goal status: INITIAL  6.  Patient will increase six minute walk test distance to >1000 for progression to community ambulator and improve gait ability  Baseline: 794 ft with SPC Goal status: INITIAL   ASSESSMENT:  CLINICAL IMPRESSION: Pt exhibits improved balance and is able to ambulate over even surfaces without SPC without difficulty. While pt shows progress, he still exhibits increased supination of L foot>R. PT has filled out form for LLE brace to be sent to physician. Additionally, pt inquired about pelvic PT during visit for incontinence following stroke. PT has also completed a referral request form to be sent to physician for Pelvic PT to address pt's concerns. The pt will benefit from further skilled PT to improve these deficits in order to increase QOL and return pt to PLOF.   OBJECTIVE IMPAIRMENTS: Abnormal gait, decreased balance, decreased coordination, decreased endurance, decreased mobility, difficulty walking, decreased ROM, decreased strength, improper body mechanics, postural dysfunction, and pain.   ACTIVITY LIMITATIONS: carrying, squatting, stairs, transfers, and locomotion level  PARTICIPATION LIMITATIONS: shopping, community activity, and yard work  PERSONAL FACTORS: 3+ comorbidities: PMH significant for dysphagia, CVA, tobacco use disorder (1 PDD), DMII with peripheral neuropathy, essential HTN, marijuana misuse, obstructive sleep apnea, recurrent major depressive disorder in partial remission, anxiety, CP  are also affecting patient's functional outcome.   REHAB POTENTIAL: Good  CLINICAL DECISION MAKING: Evolving/moderate  complexity  EVALUATION COMPLEXITY: Moderate  PLAN:  PT FREQUENCY: 1-2x/week  PT DURATION: 12 weeks  PLANNED INTERVENTIONS: 97164- PT Re-evaluation, 97110-Therapeutic exercises, 97530- Therapeutic activity, 97112- Neuromuscular re-education, 97535- Self Care, [id number]- Manual therapy, [phone]- Gait training, [phone]- Orthotic Fit/training, [phone]- Canalith repositioning, [phone]- Splinting, Patient/Family education, Balance training, Stair training, Taping, Dry Needling, Joint mobilization, Spinal mobilization, Vestibular training, DME instructions, Cryotherapy, and Biofeedback  PLAN FOR NEXT SESSION: SLB and tandem balance activities, STS/endurance activities, gait with head turns, obstacle clearance    Darryle JONELLE Patten, PT 10/21/2023, 2:11 PM

## 2023-10-26 ENCOUNTER — Ambulatory Visit: Payer: 59 | Admitting: Physical Therapy

## 2023-10-26 DIAGNOSIS — M6281 Muscle weakness (generalized): Secondary | ICD-10-CM | POA: Diagnosis not present

## 2023-10-26 DIAGNOSIS — R2681 Unsteadiness on feet: Secondary | ICD-10-CM

## 2023-10-26 DIAGNOSIS — R262 Difficulty in walking, not elsewhere classified: Secondary | ICD-10-CM

## 2023-10-26 DIAGNOSIS — R42 Dizziness and giddiness: Secondary | ICD-10-CM

## 2023-10-26 DIAGNOSIS — I6350 Cerebral infarction due to unspecified occlusion or stenosis of unspecified cerebral artery: Secondary | ICD-10-CM

## 2023-10-26 DIAGNOSIS — R278 Other lack of coordination: Secondary | ICD-10-CM

## 2023-10-26 NOTE — Therapy (Signed)
 OUTPATIENT PHYSICAL THERAPY NEURO TREATMENT   Patient Name: Bradley Morrison MRN: [id number] DOB:12/15/1975, 48 y.o., male Today's Date: 10/26/2023   PCP: Myra Wanda LABOR, MD  REFERRING PROVIDER: Carilyn Prentice BRAVO, MD   END OF SESSION:  PT End of Session - 10/26/23 1529     Visit Number 8    Number of Visits 25    Date for PT Re-Evaluation 12/15/23    PT Start Time 1532    PT Stop Time 1612    PT Time Calculation (min) 40 min    Equipment Utilized During Treatment Gait belt    Activity Tolerance Patient tolerated treatment well;No increased pain    Behavior During Therapy WFL for tasks assessed/performed               Past Medical History:  Diagnosis Date   Anxiety    CP (cerebral palsy) (HCC)    Hypertension    Stroke (HCC) 05/18/2023   No past surgical history on file. 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) 06/02/2023   Acute ischemic stroke (HCC) 05/28/2023   Dyslipidemia 05/28/2023   Essential hypertension 05/28/2023   Type 2 diabetes mellitus with peripheral neuropathy (HCC) 05/28/2023   Marijuana abuse 05/28/2023   CVA (cerebral vascular accident) (HCC) 05/28/2023   Obstructive sleep apnea 03/05/2020   Recurrent major depressive disorder, in partial remission (HCC) 08/25/2018   Anxiety    Obesity (BMI 30-39.9) 10/31/2014   Hypertension associated with diabetes (HCC) 05/21/2014    ONSET DATE: 05/18/2023  REFERRING DIAG:  Diagnosis  I63.50 (ICD-10-CM) - 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 No new falls since last week. No pain. States that he has moving around a bunch. Went to help a friend jump start a car battery.    Pt accompanied by: self  PERTINENT HISTORY:   The pt is a pleasant 48 yo male referred to PT s/p CVA. Pt states  he had four strokes  from 05/18/2023-05/27/2023 and was admitted to the hospital 2x (05/18/2023, 05/27/2023). His main concerns are LLE strength and gait impairments.. He has a hx of CP, but states he was walking fine prior to CVA, but now must use a SPC. He reports stroke has mainly affected LLE. He notes L knee hyperext during gait and wants to work on this. Pt reports he also now has pain in LLE/foot with prolonged ambulation. Pt reports 1 fall since stroke that occurred outside when his foot got tangled up in twigs. His balance is not very good without his cane. He is indep with ADLs but does have some trouble dressing. Pt reports some sleep difficulty since stroke. Pt had PT at [facility] for a week and then [facility] PT.  PMH significant for dysphagia, CVA, tobacco use disorder (1 PDD), DMII with peripheral neuropathy, essential HTN, marijuana misuse, obstructive sleep apnea, recurrent major depressive disorder in partial remission, anxiety, CP  PAIN:  Are you having pain? Yes: NPRS scale: not rated/10 Pain location: LLE/L foot Pain description: ache Aggravating factors: weightbearing  Relieving factors: not reported  PRECAUTIONS: Fall  RED FLAGS: Bowel or bladder incontinence: Yes: dr aware     WEIGHT BEARING RESTRICTIONS: No  FALLS: Has patient fallen in last 6 months? Yes. Number of falls 1  LIVING ENVIRONMENT: Lives with: lives with their family Lives in: Other  lives at father's house, lives in camper at father's house Stairs: Yes: External: 3 steps; none no handrails but grabs onto doorframe? Has following equipment at home: Single point cane and Walker - 2 wheeled  PLOF: Independent  PATIENT GOALS:  I want to be able to walk normal or as close to normal as I can get. He wants more control of L knee.   OBJECTIVE:  Note: Objective measures were completed at Evaluation unless otherwise noted.  DIAGNOSTIC FINDINGS:   Via chart 05/27/23 Trudy Anthony HERO, MD ED Course: Upon presentation to the emergency room, BP was 160/96 with temperature 97.5 with otherwise normal vital signs.  Labs revealed unremarkable CMP.  CBC showed leukocytosis 12.4 with neutrophilia.  UA was negative.  Urine drug screen was positive for cannabinoids. EKG as reviewed by me : EKG showed sinus rhythm with a rate of 86 with nonspecific interventricular conduction delay and inferior Q waves. Imaging: Code stroke CT revealed old no acute intracranial abnormalities.  CTA of the head and neck revealed normal CT perfusion with no emergent large vessel occlusion or high-grade stenosis of the intracranial arteries.   The patient was not considered a candidate for tPA per teleneurology consult due to recent CVA.  He will be admitted to an observation medical telemetry [facility] for evaluation and management.   05/30/23 MR BRAIN  IMPRESSION: 1. Bilateral lower pontine abnormal diffusion, now more intense on the left side versus two days ago. Increased T2 hyperintensity with no hemorrhage or mass effect. No new areas of involvement. Small-vessel ischemic etiology is possible. Other etiologies of symmetric brain diffusion abnormality were considered (such as toxic exposure or myelinosis), but seem unlikely given the pattern here.   2. New acute intracranial abnormality. And otherwise mild nonspecific cerebral white matter signal changes.     Electronically Signed   By: VEAR Hurst  M.D.   On: 05/30/2023 04:29  COGNITION: Overall cognitive status: Within functional limits for tasks assessed   SENSATION: WFL BLE   COORDINATION: UE WFL  LE rapid alt movement WNL, some difficulty performing testing due to  LLE weakness  EDEMA:  Reports no swelling, none observed during eval   POSTURE: rounded shoulders, postural impairment with gait (see below)   LOWER EXTREMITY MMT:    Grossly 4+/5 BLE, but  LLE with greater deficits, particularly hip flexors  Some pain with testing DF bilaterally   TRANSFERS: Assistive device utilized: Single point cane  Sit to stand: SBA Stand to sit: Complete Independence    GAIT: Gait pattern/comments:  Increased supination L foot/ankle during gait, decreased gait speed, L hip hike, decreased L foot clearance/lack of heel-strike Distance walked: , Assistive device utilized: Single point cane Level of assistance: Modified independence   FUNCTIONAL TESTS:  5 times sit to stand: 12.9 sec hands-free Timed up and go (TUG): 12.5 sec  6 minute walk test: 794 ft with SPC 10 meter walk test: 0.67 m/s  with [facility]  Berg Balance Scale: 53/56   PATIENT SURVEYS:  FOTO 57 (goal score 67)  TODAY'S TREATMENT:                                                                                                                              DATE: 10/26/23   TE: Nustep Bil UE and BLE x 5 min level 2-6.  Seated therex:  LAQ 4# AW x 12  Hip flexion 4# AW. X 12 bil  Ankle PF 4# AW x 15  Weighted gait training 351ft x 2 with 4#AW.   SLS; 15 sec bil with finger tip support x 2 bouts and then 15 sec without UE support  Tandem stance with 10 sec hold bil x 4 bil   Forward step over cane x 10 bil  Lateral step x 10 bil   CGA from PT for safety with dynamic balance training with min cues for use of hip strategy to correct lateral LOB due to increased tone in the L ankle limiting use of ankle strategy.  Pt left at end of session in  rest room with need for urination.    PATIENT EDUCATION: Education details: exercise technique Person educated: Patient Education method: Explanation Education comprehension: verbalized understanding  HOME EXERCISE PROGRAM:  Updated 10/06/23 Access Code: [email or web address] URL: [email or web address] Date: 10/06/2023 Prepared by: Darryle Patten  Exercises - Seated March  - 1-2 x daily - 5-6 x weekly - 2 sets - 10 reps - Sit to Stand  - 1-2 x daily - 5-6 x weekly - 2 sets - 12 reps - Standing Tandem Balance with Counter Support  - 1 x daily - 4-7 x weekly - 2 sets - 1 reps - 30 sec/leg hold - Standing Single Leg Stance with Counter Support  - 1 x daily - 4-7 x weekly - 2 sets - 1 reps - 30 sec/leg hold - Seated Toe Raise  - 1 x daily - 7 x weekly - 2 sets - 15-20 reps - 2-3 sec hold/rep hold  GOALS: Goals reviewed with patient? Yes  SHORT TERM GOALS: Target  date: 11/03/2023    Patient will be independent in home exercise program to improve strength/mobility for better functional independence with ADLs. Baseline: Goal status: INITIAL   LONG TERM GOALS: Target date: 12/15/2023    Patient will increase FOTO score to equal to or greater than  67   to demonstrate statistically significant improvement in mobility and quality of life.  Baseline: 57 Goal status: INITIAL  2.  Patient (<4 years old) will complete five times sit to stand test in < 10 seconds indicating an increased LE strength and improved balance. Baseline: 12.9 sec Goal status: INITIAL  3.  Patient will increase Berg Balance score by > 2 points to demonstrate decreased fall risk during functional activities Baseline: 53/56  Goal status: INITIAL/REVISED   4.  Patient will increase 10 meter walk test to >1.59m/s as to improve gait speed for better community ambulation and to reduce fall risk. Baseline: 0.67 m/s with SPC Goal status: INITIAL  5.  Patient will reduce timed up and go to <11 seconds to reduce  fall risk and demonstrate improved transfer/gait ability. Baseline: 12.5 sec  Goal status: INITIAL  6.  Patient will increase six minute walk test distance to >1000 for progression to community ambulator and improve gait ability  Baseline: 794 ft with SPC Goal status: INITIAL   ASSESSMENT:  CLINICAL IMPRESSION: Pt puts forth excellent effort towards PT interventions. Continued to tolerate gait without AD and no LOB, mild inversion noted with fatigue. Able to demonstrate improved tolerance to high level balance training including SLS, tandem stance, and obstacle navigation and to step over 2 in obstacle in floor. The pt will benefit from further skilled PT to improve these deficits in order to increase QOL and return pt to PLOF.   OBJECTIVE IMPAIRMENTS: Abnormal gait, decreased balance, decreased coordination, decreased endurance, decreased mobility, difficulty walking, decreased ROM, decreased strength, improper body mechanics, postural dysfunction, and pain.   ACTIVITY LIMITATIONS: carrying, squatting, stairs, transfers, and locomotion level  PARTICIPATION LIMITATIONS: shopping, community activity, and yard work  PERSONAL FACTORS: 3+ comorbidities: PMH significant for dysphagia, CVA, tobacco use disorder (1 PDD), DMII with peripheral neuropathy, essential HTN, marijuana misuse, obstructive sleep apnea, recurrent major depressive disorder in partial remission, anxiety, CP  are also affecting patient's functional outcome.   REHAB POTENTIAL: Good  CLINICAL DECISION MAKING: Evolving/moderate complexity  EVALUATION COMPLEXITY: Moderate  PLAN:  PT FREQUENCY: 1-2x/week  PT DURATION: 12 weeks  PLANNED INTERVENTIONS: 97164- PT Re-evaluation, 97110-Therapeutic exercises, 97530- Therapeutic activity, [id number]- Neuromuscular re-education, 97535- Self Care, [id number]- Manual therapy, [id number]- Gait training, [phone]- Orthotic Fit/training, [phone]- Canalith repositioning, [phone]- Splinting, Patient/Family  education, Balance training, Stair training, Taping, Dry Needling, Joint mobilization, Spinal mobilization, Vestibular training, DME instructions, Cryotherapy, and Biofeedback  PLAN FOR NEXT SESSION:   SLB and tandem balance activities, STS/endurance activities, gait with head turns, obstacle clearance  Re-assess HEP.   Massie FORBES Dollar, PT 10/26/2023, 3:30 PM

## 2023-10-28 ENCOUNTER — Telehealth: Payer: Self-pay

## 2023-10-28 ENCOUNTER — Ambulatory Visit: Payer: 59

## 2023-10-28 NOTE — Telephone Encounter (Signed)
 PT contacted pt via secure phone line due missed visit/no-show. PT reviewed no-show policy with pt and pt verbalized understanding. PT provided pt with next appointment date/time.  Temple Pacini PT, DPT

## 2023-11-01 ENCOUNTER — Ambulatory Visit: Payer: 59

## 2023-11-01 DIAGNOSIS — R262 Difficulty in walking, not elsewhere classified: Secondary | ICD-10-CM

## 2023-11-01 DIAGNOSIS — M6281 Muscle weakness (generalized): Secondary | ICD-10-CM | POA: Diagnosis not present

## 2023-11-01 DIAGNOSIS — R278 Other lack of coordination: Secondary | ICD-10-CM

## 2023-11-01 NOTE — Therapy (Signed)
 OUTPATIENT PHYSICAL THERAPY NEURO TREATMENT   Patient Name: Bradley Morrison MRN: [id number] DOB:June 30, 1976, 48 y.o., male Today's Date: 11/01/2023   PCP: Bradley Wanda LABOR, MD  REFERRING PROVIDER: Carilyn Prentice BRAVO, MD   END OF SESSION:  PT End of Session - 11/01/23 1015     Visit Number 9    Number of Visits 25    Date for PT Re-Evaluation 12/15/23    PT Start Time 1015    PT Stop Time 1059    PT Time Calculation (min) 44 min    Equipment Utilized During Treatment Gait belt    Activity Tolerance Patient tolerated treatment well;No increased pain    Behavior During Therapy WFL for tasks assessed/performed                Past Medical History:  Diagnosis Date   Anxiety    CP (cerebral palsy) (HCC)    Hypertension    Stroke (HCC) 05/18/2023   No past surgical history on file. 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) 06/02/2023   Acute ischemic stroke (HCC) 05/28/2023   Dyslipidemia 05/28/2023   Essential hypertension 05/28/2023   Type 2 diabetes mellitus with peripheral neuropathy (HCC) 05/28/2023   Marijuana abuse 05/28/2023   CVA (cerebral vascular accident) (HCC) 05/28/2023   Obstructive sleep apnea 03/05/2020   Recurrent major depressive disorder, in partial remission (HCC) 08/25/2018   Anxiety    Obesity (BMI 30-39.9) 10/31/2014   Hypertension associated with diabetes (HCC) 05/21/2014    ONSET DATE: 05/18/2023  REFERRING DIAG:  Diagnosis  I63.50 (ICD-10-CM) - 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    Pt accompanied by: self  PERTINENT HISTORY:   The pt is a pleasant 48 yo male referred to PT s/p CVA. Pt states he had four strokes  from 05/18/2023-05/27/2023 and was admitted to the hospital 2x (05/18/2023, 05/27/2023). His main concerns are LLE strength and gait impairments.. He has a hx of CP, but states he was walking fine prior to CVA, but  now must use a SPC. He reports stroke has mainly affected LLE. He notes L knee hyperext during gait and wants to work on this. Pt reports he also now has pain in LLE/foot with prolonged ambulation. Pt reports 1 fall since stroke that occurred outside when his foot got tangled up in twigs. His balance is not very good without his cane. He is indep with ADLs but does have some trouble dressing. Pt reports some sleep difficulty since stroke. Pt had PT at [facility] for a week and then [facility] PT.  PMH significant for dysphagia, CVA, tobacco use disorder (1 PDD), DMII with peripheral neuropathy, essential HTN, marijuana misuse, obstructive sleep apnea, recurrent major depressive disorder in partial remission, anxiety, CP  PAIN:  Are you having pain? Yes: NPRS scale: not rated/10 Pain location: LLE/L foot Pain description: ache Aggravating factors: weightbearing  Relieving factors: not reported  PRECAUTIONS: Fall  RED FLAGS: Bowel or bladder incontinence: Yes: dr aware     WEIGHT BEARING RESTRICTIONS: No  FALLS: Has patient fallen in last 6 months? Yes. Number of falls 1  LIVING ENVIRONMENT: Lives with: lives with their family Lives in: Other lives at father's house, lives in camper at father's house Stairs: Yes: External: 3 steps; none no handrails but grabs onto doorframe? Has following equipment at home: Single point cane and Walker - 2 wheeled  PLOF:  Independent  PATIENT GOALS:  I want to be able to walk normal or as close to normal as I can get. He wants more control of L knee.   OBJECTIVE:  Note: Objective measures were completed at Evaluation unless otherwise noted.  DIAGNOSTIC FINDINGS:   Via chart 05/27/23 Bradley Anthony HERO, MD ED Course: Upon presentation to the emergency room, BP was 160/96 with temperature 97.5 with otherwise normal vital signs.  Labs revealed unremarkable CMP.  CBC showed leukocytosis 12.4 with neutrophilia.  UA was negative.  Urine drug screen was positive for cannabinoids. EKG as reviewed by me : EKG showed sinus rhythm with a rate of 86 with nonspecific interventricular conduction delay and inferior Q waves. Imaging: Code stroke CT revealed old no acute intracranial abnormalities.  CTA of the head and neck revealed normal CT perfusion with no emergent large vessel occlusion or high-grade stenosis of the intracranial arteries.   The patient was not considered a candidate for tPA per teleneurology consult due to recent CVA.  He will be admitted to an observation medical telemetry [facility] for evaluation and management.   05/30/23 MR BRAIN  IMPRESSION: 1. Bilateral lower pontine abnormal diffusion, now more intense on the left side versus two days ago. Increased T2 hyperintensity with no hemorrhage or mass effect. No new areas of involvement. Small-vessel ischemic etiology is possible. Other etiologies of symmetric brain diffusion abnormality were considered (such as toxic exposure or myelinosis), but seem unlikely given the pattern here.   2. New acute intracranial abnormality. And otherwise mild nonspecific cerebral white matter signal changes.     Electronically Signed   By: VEAR Hurst M.D.   On: 05/30/2023 04:29  COGNITION: Overall cognitive status: Within functional limits for tasks assessed   SENSATION: WFL BLE   COORDINATION: UE WFL  LE rapid alt movement WNL, some difficulty performing  testing due to LLE weakness  EDEMA:  Reports no swelling, none observed during eval   POSTURE: rounded shoulders, postural impairment with gait (see below)   LOWER EXTREMITY MMT:    Grossly 4+/5 BLE, but  LLE with greater deficits, particularly  hip flexors  Some pain with testing DF bilaterally   TRANSFERS: Assistive device utilized: Single point cane  Sit to stand: SBA Stand to sit: Complete Independence    GAIT: Gait pattern/comments:  Increased supination L foot/ankle during gait, decreased gait speed, L hip hike, decreased L foot clearance/lack of heel-strike Distance walked: , Assistive device utilized: Single point cane Level of assistance: Modified independence   FUNCTIONAL TESTS:  5 times sit to stand: 12.9 sec hands-free Timed up and go (TUG): 12.5 sec  6 minute walk test: 794 ft with SPC 10 meter walk test: 0.67 m/s  with [facility]  Berg Balance Scale: 53/56   PATIENT SURVEYS:  FOTO 57 (goal score 67)  TODAY'S TREATMENT:                                                                                                                              DATE: 11/01/23     Neuro RE-ed: Bosu ball: flat side up 60 seconds Bosu ball: round side up: crane step up 10x each side ; SUE support  TE: 10x STS throw ball ; x2 sets  Seated therex:  LAQ 5# AW x 12  Hip flexion 5# AW. X 12 bil  Ankle PF 5# AW x 15  Weighted gait training 320ft x 2 with 5#AW.    CGA from PT for safety with dynamic balance training with min cues for use of hip strategy to correct lateral LOB due to increased tone in the L ankle limiting use of ankle strategy.  Pt left at end of session in rest room with need for urination.    PATIENT EDUCATION: Education details: exercise technique Person educated: Patient Education method: Explanation Education comprehension: verbalized understanding  HOME EXERCISE PROGRAM:  Updated 10/06/23 Access Code: [email or web address] URL:  [email or web address] Date: 10/06/2023 Prepared by: Darryle Patten  Exercises - Seated March  - 1-2 x daily - 5-6 x weekly - 2 sets - 10 reps - Sit to Stand  - 1-2 x daily - 5-6 x weekly - 2 sets - 12 reps - Standing Tandem Balance with Counter Support  - 1 x daily - 4-7 x weekly - 2 sets - 1 reps - 30 sec/leg hold - Standing Single Leg Stance with Counter Support  - 1 x daily - 4-7 x weekly - 2 sets - 1 reps - 30 sec/leg hold - Seated Toe Raise  - 1 x daily - 7 x weekly - 2 sets - 15-20 reps - 2-3 sec hold/rep hold  GOALS: Goals reviewed with patient? Yes  SHORT TERM GOALS: Target date: 11/03/2023    Patient will be independent in home exercise program to improve strength/mobility for better functional independence with ADLs. Baseline: Goal status: INITIAL   LONG TERM GOALS: Target date: 12/15/2023    Patient will increase FOTO score to equal to or greater than  67   to demonstrate statistically significant improvement in mobility and quality of life.  Baseline: 57 Goal status: INITIAL  2.  Patient (<95 years old) will complete five times sit to stand test in < 10 seconds indicating an increased LE strength and improved balance. Baseline: 12.9 sec Goal status: INITIAL  3.  Patient will increase Berg Balance score by > 2 points to demonstrate decreased fall risk during functional activities Baseline: 53/56  Goal status: INITIAL/REVISED   4.  Patient will increase 10 meter walk test to >1.43m/s as to improve gait speed for better community ambulation and to reduce fall risk. Baseline: 0.67 m/s with SPC Goal status: INITIAL  5.  Patient will reduce timed up and go to <11 seconds to reduce fall risk and demonstrate improved transfer/gait ability. Baseline: 12.5 sec  Goal status: INITIAL  6.  Patient will increase six minute walk test distance to >1000 for progression to community ambulator and improve gait ability  Baseline: 794 ft with SPC Goal status:  INITIAL   ASSESSMENT:  CLINICAL IMPRESSION:  Patient came on wrong day, seen by other therapist as this time. Patient highly motivated throughout session. Does continue to have supination with fatigue and knee hyperextension of LLE. Patient given form for AFO, give phone number for [facility]. The pt will benefit from further skilled PT to improve these deficits in order to increase QOL and return pt to PLOF.   OBJECTIVE IMPAIRMENTS: Abnormal gait, decreased balance, decreased coordination, decreased endurance, decreased mobility, difficulty walking, decreased ROM, decreased strength, improper body mechanics, postural dysfunction, and pain.   ACTIVITY LIMITATIONS: carrying, squatting, stairs, transfers, and locomotion level  PARTICIPATION LIMITATIONS: shopping, community activity, and yard work  PERSONAL FACTORS: 3+ comorbidities: PMH significant for dysphagia, CVA, tobacco use disorder (1 PDD), DMII with peripheral neuropathy, essential HTN, marijuana misuse, obstructive sleep apnea, recurrent major depressive disorder in partial remission, anxiety, CP  are also affecting patient's functional outcome.   REHAB POTENTIAL: Good  CLINICAL DECISION MAKING: Evolving/moderate complexity  EVALUATION COMPLEXITY: Moderate  PLAN:  PT FREQUENCY: 1-2x/week  PT DURATION: 12 weeks  PLANNED INTERVENTIONS: 97164- PT Re-evaluation, 97110-Therapeutic exercises, 97530- Therapeutic activity, 97112- Neuromuscular re-education, 97535- Self Care, 02859- Manual therapy, [phone]- Gait training, [phone]- Orthotic Fit/training, [phone]- Canalith repositioning, [phone]- Splinting, Patient/Family education, Balance training, Stair training, Taping, Dry Needling, Joint mobilization, Spinal mobilization, Vestibular training, DME instructions, Cryotherapy, and Biofeedback  PLAN FOR NEXT SESSION:   SLB and tandem balance activities, STS/endurance activities, gait with head turns, obstacle clearance  Re-assess HEP as  needed  Kenadie Royce, PT 11/01/2023, 11:05 AM

## 2023-11-03 ENCOUNTER — Encounter: Payer: Self-pay | Admitting: Emergency Medicine

## 2023-11-03 ENCOUNTER — Ambulatory Visit: Payer: 59 | Admitting: Physical Therapy

## 2023-11-03 ENCOUNTER — Other Ambulatory Visit: Payer: Self-pay

## 2023-11-03 ENCOUNTER — Emergency Department
Admission: EM | Admit: 2023-11-03 | Discharge: 2023-11-03 | Disposition: A | Discharge status: Home / Self Care | Payer: 59 | Attending: Emergency Medicine | Admitting: Emergency Medicine

## 2023-11-03 DIAGNOSIS — I10 Essential (primary) hypertension: Secondary | ICD-10-CM | POA: Diagnosis not present

## 2023-11-03 DIAGNOSIS — L509 Urticaria, unspecified: Secondary | ICD-10-CM | POA: Insufficient documentation

## 2023-11-03 DIAGNOSIS — Z8673 Personal history of transient ischemic attack (TIA), and cerebral infarction without residual deficits: Secondary | ICD-10-CM | POA: Insufficient documentation

## 2023-11-03 DIAGNOSIS — E119 Type 2 diabetes mellitus without complications: Secondary | ICD-10-CM | POA: Diagnosis not present

## 2023-11-03 DIAGNOSIS — R21 Rash and other nonspecific skin eruption: Secondary | ICD-10-CM | POA: Diagnosis present

## 2023-11-03 MED ORDER — PREDNISONE 20 MG PO TABS
60.0000 mg | ORAL_TABLET | Freq: Once | ORAL | Status: AC
  Administered 2023-11-03: 60 mg via ORAL
  Filled 2023-11-03: qty 3

## 2023-11-03 MED ORDER — DIPHENHYDRAMINE HCL 25 MG PO CAPS
50.0000 mg | ORAL_CAPSULE | Freq: Once | ORAL | Status: AC
  Administered 2023-11-03: 50 mg via ORAL
  Filled 2023-11-03: qty 2

## 2023-11-03 MED ORDER — DIPHENHYDRAMINE HCL 25 MG PO TABS: 25.0000 mg | ORAL_TABLET | Freq: Four times a day (QID) | ORAL | 0 refills | Status: AC

## 2023-11-03 MED ORDER — PREDNISONE 20 MG PO TABS: 40.0000 mg | ORAL_TABLET | Freq: Every day | ORAL | 0 refills | Status: AC

## 2023-11-03 NOTE — Discharge Instructions (Addendum)
 Your evaluation today appears to be a mild allergic reaction. It may be an allergy to meat products that can be provoked by tick bites.  If you have not been tested for this, please discuss it with your primary care doctor at your next visit.  Take prednisone  and benadryl  as prescribed to help control the symptoms.  Monitor your blood sugar over the next week while taking prednisone , and drink plenty of water to stay hydrated.

## 2023-11-03 NOTE — ED Notes (Signed)
 D/C, new RX, and reasons to return discussed with pt, pt verbalized understanding. NAD noted. Pt states he feels better. Reasons to return discussed with pt.

## 2023-11-03 NOTE — ED Triage Notes (Signed)
 Patient ambulatory to triage with steady gait, without difficulty or distress noted; pt reports he awoke with "itching and bumps"; pt reports he took his epi pen without relief; when asked what symptoms provoked his admin of his epi pen, st "I thought that's what I was supposed to do"; denies any diff breathing or swallowing or chest tightness; st itching unrelieved by epi pen

## 2023-11-03 NOTE — ED Provider Notes (Signed)
 [facility] Provider Note    Event Date/Time   First MD Initiated Contact with Patient 11/03/23 [phone]     (approximate)   History   Chief Complaint: Rash   HPI  Bradley Morrison is a 48 y.o. male with a history of anxiety, hypertension, diabetes, prior strokes who comes ED complaining of rash that he noticed this morning, itchy.  He was not having any shortness of breath or dizziness or chest pain or other acute symptoms other than rash, but he used an EpiPen  at home.  No change in symptoms.  Comes ED for evaluation.   Notes that he thinks he has a meat allergy.  He has had multiple tick bites in the past.  Last night he ate red meat for dinner.       Physical Exam   Triage Vital Signs: ED Triage Vitals  Encounter Vitals Group     BP 11/03/23 0607 (!) 180/105     Systolic BP Percentile --      Diastolic BP Percentile --      Pulse Rate 11/03/23 0607 80     Resp 11/03/23 0607 19     Temp 11/03/23 0607 97.8 F (36.6 C)     Temp Source 11/03/23 0607 Oral     SpO2 11/03/23 0607 95 %     Weight 11/03/23 0605 220 lb (99.8 kg)     Height 11/03/23 0605 5\' 6"  (1.676 m)     Head Circumference --      Peak Flow --      Pain Score 11/03/23 0605 0     Pain Loc --      Pain Education --      Exclude from Growth Chart --     Most recent vital signs: Vitals:   11/03/23 0607  BP: (!) 180/105  Pulse: 80  Resp: 19  Temp: 97.8 F (36.6 C)  SpO2: 95%    General: Awake, no distress.  CV:  Good peripheral perfusion.  Regular rate rhythm Resp:  Normal effort.  Clear to auscultation, no stridor or wheezing Abd:  No distention.  Other:  Moist oral mucosa, no oropharyngeal swelling.  Urticarial rash on extremities and back of the neck.  No petechia or purpura or bullae   ED Results / Procedures / Treatments   Labs (all labs ordered are listed, but only abnormal results are displayed) Labs Reviewed - No data to  display   EKG    RADIOLOGY    PROCEDURES:  Procedures   MEDICATIONS ORDERED IN ED: Medications  diphenhydrAMINE  (BENADRYL ) capsule 50 mg (has no administration in time range)  predniSONE  (DELTASONE ) tablet 60 mg (has no administration in time range)     IMPRESSION / MDM / ASSESSMENT AND PLAN / ED COURSE  I reviewed the triage vital signs and the nursing notes.  DDx: Contact dermatitis, allergic reaction, dyshidrotic eczema, doubt vasculitis, SJS, infection Patient's presentation is most consistent with acute presentation with potential threat to life or bodily function.  Patient presents with urticarial rash, itchy, history highly suspicious for alpha gal syndrome.  Will give Benadryl  and prednisone .  Recommend follow-up with PCP.       FINAL CLINICAL IMPRESSION(S) / ED DIAGNOSES   Final diagnoses:  Hives     Rx / DC Orders   ED Discharge Orders     None        Note:  This document was prepared using Dragon voice recognition software and may  include unintentional dictation errors.   Jacquie Maudlin, MD 11/03/23 [phone]

## 2023-11-03 NOTE — ED Notes (Signed)
 Pt contact made and myself introduced. Pt is awake, CAO, and breathing normally. Pt is in no obvious distress at this time.

## 2023-11-08 ENCOUNTER — Ambulatory Visit: Payer: 59 | Admitting: Physical Therapy

## 2023-11-08 DIAGNOSIS — R2681 Unsteadiness on feet: Secondary | ICD-10-CM

## 2023-11-08 DIAGNOSIS — R42 Dizziness and giddiness: Secondary | ICD-10-CM

## 2023-11-08 DIAGNOSIS — R262 Difficulty in walking, not elsewhere classified: Secondary | ICD-10-CM

## 2023-11-08 DIAGNOSIS — M6281 Muscle weakness (generalized): Secondary | ICD-10-CM

## 2023-11-08 DIAGNOSIS — R278 Other lack of coordination: Secondary | ICD-10-CM

## 2023-11-08 DIAGNOSIS — I6350 Cerebral infarction due to unspecified occlusion or stenosis of unspecified cerebral artery: Secondary | ICD-10-CM

## 2023-11-08 NOTE — Therapy (Signed)
OUTPATIENT PHYSICAL THERAPY NEURO TREATMENT/  PHYSICAL THERAPY PROGRESS NOTE   Dates of reporting period  09/22/2023   to   11/08/2023    Patient Name: Bradley Morrison MRN: [id number] DOB:01-29-1976, 48 y.o., male Today's Date: 11/08/2023   PCP: Karle Starch, MD  REFERRING PROVIDER: Erick Colace, MD   END OF SESSION:  PT End of Session - 11/08/23 1313     Visit Number 10    Number of Visits 25    Date for PT Re-Evaluation 12/15/23    PT Start Time 1315    PT Stop Time 1355    PT Time Calculation (min) 40 min    Equipment Utilized During Treatment Gait belt    Activity Tolerance Patient tolerated treatment well;No increased pain    Behavior During Therapy WFL for tasks assessed/performed                Past Medical History:  Diagnosis Date   Anxiety    CP (cerebral palsy) (HCC)    Hypertension    Stroke (HCC) 05/18/2023   No past surgical history on file. 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) 06/02/2023   Acute ischemic stroke (HCC) 05/28/2023   Dyslipidemia 05/28/2023   Essential hypertension 05/28/2023   Type 2 diabetes mellitus with peripheral neuropathy (HCC) 05/28/2023   Marijuana abuse 05/28/2023   CVA (cerebral vascular accident) (HCC) 05/28/2023   Obstructive sleep apnea 03/05/2020   Recurrent major depressive disorder, in partial remission (HCC) 08/25/2018   Anxiety    Obesity (BMI 30-39.9) 10/31/2014   Hypertension associated with diabetes (HCC) 05/21/2014    ONSET DATE: 05/18/2023  REFERRING DIAG:  Diagnosis  I63.50 (ICD-10-CM) - 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 Helped a friend move a bed in his trunk. Was able to help lift little.  States that the tone in the LLE with fatigue as well as stair management are  the 2 greatest limiting movements at this time.    Pt accompanied by: self  PERTINENT HISTORY:   The pt is a pleasant 48 yo male referred to PT s/p CVA. Pt states he had "four strokes"  from 05/18/2023-05/27/2023 and was admitted to the hospital 2x (05/18/2023, 05/27/2023). His main concerns are LLE strength and gait impairments.. He has a hx of CP, but states he was walking fine prior to CVA, but now must use a SPC. He reports stroke has mainly affected LLE. He notes L knee hyperext during gait and wants to work on this. Pt reports he also now has pain in LLE/foot with prolonged ambulation. Pt reports 1 fall since stroke that occurred outside when his foot "got tangled up in twigs." His balance is not very good without his cane. He is indep with ADLs but does have some trouble dressing. Pt reports some sleep difficulty since stroke. Pt had PT at [facility] for a week and then [facility] PT.  PMH significant for dysphagia, CVA, tobacco use disorder (1 PDD), DMII with peripheral neuropathy, essential HTN, marijuana misuse, obstructive sleep apnea, recurrent major depressive disorder in partial remission, anxiety, CP  PAIN:  Are you having pain? Yes: NPRS scale: not rated/10 Pain location: LLE/L foot Pain description: ache Aggravating factors: weightbearing  Relieving factors: not reported  PRECAUTIONS: Fall  RED FLAGS: Bowel or bladder incontinence: Yes: dr aware      WEIGHT BEARING RESTRICTIONS: No  FALLS: Has patient fallen in last 6 months? Yes. Number of falls 1  LIVING ENVIRONMENT: Lives with: lives with their family Lives in: Other lives at father's house, lives in camper at father's house Stairs: Yes: External: 3 steps; none no handrails but grabs onto doorframe? Has following equipment at home: Single point cane and Walker - 2 wheeled  PLOF: Independent  PATIENT GOALS:  I want to be able to walk normal or as close to normal as I can get. He wants more control of L knee.   OBJECTIVE:  Note: Objective measures were completed at Evaluation unless otherwise noted.  DIAGNOSTIC FINDINGS:   Via chart 05/27/23 [name], MD "ED Course: Upon presentation to the emergency room, BP was 160/96 with temperature 97.5 with otherwise normal vital signs.  Labs revealed unremarkable CMP.  CBC showed leukocytosis 12.4 with neutrophilia.  UA was negative.  Urine drug screen was positive for cannabinoids. EKG as reviewed by me : EKG showed sinus rhythm with a rate of 86 with nonspecific interventricular conduction delay and inferior Q waves. Imaging: Code stroke CT revealed old no acute intracranial abnormalities.  CTA of the head and neck revealed normal CT perfusion with no emergent large vessel occlusion or high-grade stenosis of the intracranial arteries.   The patient was not considered a candidate for tPA per teleneurology consult due to recent CVA.  He will be admitted to an observation medical telemetry [facility] for evaluation and management."   05/30/23 MR BRAIN " IMPRESSION: 1. Bilateral lower pontine abnormal diffusion, now more intense on the left side versus two days ago. Increased T2 hyperintensity with no hemorrhage or mass effect. No new areas of involvement. Small-vessel ischemic etiology is possible. Other etiologies of symmetric brain diffusion abnormality were considered (such as toxic exposure or myelinosis), but seem unlikely  given the pattern here.   2. New acute intracranial abnormality. And otherwise mild nonspecific cerebral white matter signal changes.     Electronically Signed   By: Odessa Fleming M.D.   On:  05/30/2023 04:29"  COGNITION: Overall cognitive status: Within functional limits for tasks assessed   SENSATION: WFL BLE   COORDINATION: UE WFL  LE rapid alt movement WNL, some difficulty performing testing due to LLE weakness  EDEMA:  Reports no swelling, none observed during eval   POSTURE: rounded shoulders, postural impairment with gait (see below)   LOWER EXTREMITY MMT:    Grossly 4+/5 BLE, but  LLE with greater deficits, particularly hip flexors  Some pain with testing DF bilaterally   TRANSFERS: Assistive device utilized: Single point cane  Sit to stand: SBA Stand to sit: Complete Independence    GAIT: Gait pattern/comments:  Increased supination L foot/ankle during gait, decreased gait speed, L hip hike, decreased L foot clearance/lack of heel-strike Distance walked: , Assistive device utilized: Single point cane Level of assistance: Modified independence   FUNCTIONAL TESTS:  5 times sit to stand: 12.9 sec hands-free Timed up and go (TUG): 12.5 sec  6 minute walk test: 794 ft with SPC 10 meter walk test: 0.67 m/s  with [facility]  Berg Balance Scale: 53/56   PATIENT SURVEYS:  FOTO 57 (goal score 67)  TODAY'S TREATMENT:                                                                                                                              DATE: 11/08/23   PT instructed pt in goal assessment for progress note. See below for details.   PATIENT EDUCATION: Education details: exercise technique Person educated: Patient Education method: Explanation Education comprehension: verbalized understanding  HOME EXERCISE PROGRAM:  Access Code: [email or web address] URL: [email or web address] Date: 11/08/2023 Prepared by: Grier Rocher  Exercises - Seated  March  - 1-2 x daily - 5-6 x weekly - 2 sets - 10 reps - Sit to Stand  - 1-2 x daily - 5-6 x weekly - 2 sets - 12 reps - Standing Tandem Balance with Counter Support  - 1 x daily - 4-7 x weekly - 2 sets - 1 reps - 30 sec/leg hold - Standing Single Leg Stance with Counter Support  - 1 x daily - 4-7 x weekly - 2 sets - 1 reps - 30 sec/leg hold - Seated Toe Raise  - 1 x daily - 7 x weekly - 2 sets - 15-20 reps - 2-3 sec hold/rep hold - Backward Walking with Counter Support  - 1 x daily - 7 x weekly - 3 sets - 10 reps - Side Stepping with Counter Support  - 1 x daily - 7 x weekly - 3 sets - 10 reps - Heel Walking with Counter Support  - 1 x daily - 7 x weekly - 3 sets - 10 reps     GOALS: Goals reviewed with patient? Yes  SHORT TERM GOALS: Target date: 11/03/2023   Patient will be independent in home exercise program to improve strength/mobility for better functional independence with ADLs. Baseline: reports preforming 1-2 times a week Goal  status: INITIAL   LONG TERM GOALS: Target date: 12/15/2023    Patient will increase FOTO score to equal to or greater than  67   to demonstrate statistically significant improvement in mobility and quality of life.  Baseline: 57 1/20: 68  Goal status: MET  2.  Patient (<75 years old) will complete five times sit to stand test in < 10 seconds indicating an increased LE strength and improved balance. Baseline: 12.9 sec 1/20: 10.35 sec  Goal status: IN PROGRESS  3.  Patient will increase Berg Balance score by > 2 points to demonstrate decreased fall risk during functional activities Baseline: 53/56  1/20: 53 Goal status: IN PROGRESS/REVISED   4.  Patient will increase 10 meter walk test to >1.74m/s as to improve gait speed for better community ambulation and to reduce fall risk. Baseline: 0.67 m/s with [facility] 1/20: 1.65m/s  Goal status: MET  5.  Patient will reduce timed up and go to <11 seconds to reduce fall risk and demonstrate improved  transfer/gait ability. Baseline: 12.5 sec  1/20: 8.95 sec normal. 11.44 with dual cog.  Goal status: IN PROGRESS  6.  Patient will increase six minute walk test distance to >1000 for progression to community ambulator and improve gait ability  Baseline: 794 ft with [facility] 1/20: 1122ft with no AD  Goal status: MET   ASSESSMENT:  CLINICAL IMPRESSION:  Patient instructed in goal assessment for progress note. Noted to have significantly improved balance compared to Eval. Increased gait speed to <1.34m/s, reduced normal Tug to 8.95sec, reduced 5xSTS, and increased 6 min walk test. Pt reports that stairs and tone in the LLE, are still greatest limiting factors. He will be getting an AFO in the near future. Patient's condition has the potential to improve in response to therapy. Maximum improvement is yet to be obtained. The anticipated improvement is attainable and reasonable in a generally predictable time.  The pt will benefit from further skilled PT to improve these deficits in order to increase QOL and return pt to PLOF.   OBJECTIVE IMPAIRMENTS: Abnormal gait, decreased balance, decreased coordination, decreased endurance, decreased mobility, difficulty walking, decreased ROM, decreased strength, improper body mechanics, postural dysfunction, and pain.   ACTIVITY LIMITATIONS: carrying, squatting, stairs, transfers, and locomotion level  PARTICIPATION LIMITATIONS: shopping, community activity, and yard work  PERSONAL FACTORS: 3+ comorbidities: PMH significant for dysphagia, CVA, tobacco use disorder (1 PDD), DMII with peripheral neuropathy, essential HTN, marijuana misuse, obstructive sleep apnea, recurrent major depressive disorder in partial remission, anxiety, CP  are also affecting patient's functional outcome.   REHAB POTENTIAL: Good  CLINICAL DECISION MAKING: Evolving/moderate complexity  EVALUATION COMPLEXITY: Moderate  PLAN:  PT FREQUENCY: 1-2x/week  PT DURATION: 12  weeks  PLANNED INTERVENTIONS: 97164- PT Re-evaluation, 97110-Therapeutic exercises, 97530- Therapeutic activity, 97112- Neuromuscular re-education, 97535- Self Care, 64403- Manual therapy, [phone]- Gait training, [phone]- Orthotic Fit/training, [phone]- Canalith repositioning, [phone]- Splinting, Patient/Family education, Balance training, Stair training, Taping, Dry Needling, Joint mobilization, Spinal mobilization, Vestibular training, DME instructions, Cryotherapy, and Biofeedback  PLAN FOR NEXT SESSION:   SLB and tandem balance activities, STS/endurance activities, gait with head turns, obstacle clearance    Golden Pop, PT 11/08/2023, 1:14 PM

## 2023-11-10 ENCOUNTER — Ambulatory Visit: Payer: 59

## 2023-11-16 ENCOUNTER — Ambulatory Visit: Payer: 59

## 2023-11-18 ENCOUNTER — Ambulatory Visit: Payer: 59

## 2023-11-23 ENCOUNTER — Ambulatory Visit: Payer: 59

## 2023-11-25 ENCOUNTER — Ambulatory Visit: Payer: 59

## 2023-11-29 ENCOUNTER — Ambulatory Visit: Payer: Medicare HMO

## 2023-12-01 ENCOUNTER — Ambulatory Visit: Payer: Medicare HMO

## 2023-12-06 ENCOUNTER — Ambulatory Visit: Payer: Medicare HMO

## 2023-12-08 ENCOUNTER — Ambulatory Visit: Payer: Medicare HMO

## 2023-12-13 ENCOUNTER — Ambulatory Visit: Payer: Medicare HMO

## 2023-12-15 ENCOUNTER — Ambulatory Visit: Payer: Medicare HMO

## 2023-12-20 ENCOUNTER — Ambulatory Visit: Payer: Medicare HMO

## 2023-12-22 ENCOUNTER — Ambulatory Visit: Payer: Medicare HMO

## 2023-12-23 ENCOUNTER — Ambulatory Visit: Attending: Physical Medicine & Rehabilitation | Admitting: Physical Therapy

## 2023-12-24 ENCOUNTER — Telehealth: Payer: Self-pay | Admitting: Physical Therapy

## 2023-12-24 NOTE — Telephone Encounter (Signed)
 Called patient 2x on 03/06 but no VMB set up and he didnt answer. Tried to call again 03/07 but still no answer and no VM. Cancelled all appointments.

## 2023-12-27 ENCOUNTER — Ambulatory Visit: Payer: Medicare HMO

## 2023-12-28 ENCOUNTER — Encounter: Admitting: Physical Therapy

## 2023-12-29 ENCOUNTER — Ambulatory Visit: Payer: Medicare HMO

## 2023-12-30 ENCOUNTER — Ambulatory Visit: Payer: Medicare HMO

## 2024-01-03 ENCOUNTER — Ambulatory Visit: Payer: Medicare HMO

## 2024-01-04 ENCOUNTER — Encounter: Admitting: Physical Therapy

## 2024-01-04 ENCOUNTER — Ambulatory Visit: Payer: Medicare HMO

## 2024-01-05 ENCOUNTER — Ambulatory Visit: Payer: Medicare HMO

## 2024-01-06 ENCOUNTER — Ambulatory Visit: Payer: 59 | Admitting: Physical Therapy

## 2024-01-06 ENCOUNTER — Ambulatory Visit: Payer: Medicare HMO

## 2024-01-10 ENCOUNTER — Ambulatory Visit: Payer: Medicare HMO

## 2024-01-12 ENCOUNTER — Ambulatory Visit: Payer: Medicare HMO

## 2024-01-13 ENCOUNTER — Encounter: Payer: 59 | Admitting: Physical Therapy

## 2024-01-17 ENCOUNTER — Ambulatory Visit: Payer: Medicare HMO

## 2024-01-19 ENCOUNTER — Ambulatory Visit: Payer: Medicare HMO

## 2024-01-20 ENCOUNTER — Encounter: Payer: 59 | Admitting: Physical Therapy

## 2024-01-24 ENCOUNTER — Ambulatory Visit: Payer: Medicare HMO

## 2024-01-26 ENCOUNTER — Ambulatory Visit: Payer: Medicare HMO

## 2024-01-27 ENCOUNTER — Encounter: Payer: 59 | Admitting: Physical Therapy

## 2024-01-31 ENCOUNTER — Ambulatory Visit: Payer: Medicare HMO

## 2024-02-01 ENCOUNTER — Encounter: Payer: 59 | Admitting: Physical Therapy

## 2024-02-02 ENCOUNTER — Ambulatory Visit: Payer: Medicare HMO

## 2024-02-03 ENCOUNTER — Encounter: Payer: 59 | Admitting: Physical Therapy

## 2024-02-07 ENCOUNTER — Ambulatory Visit: Payer: Medicare HMO

## 2024-02-09 ENCOUNTER — Ambulatory Visit: Payer: Medicare HMO

## 2024-02-09 NOTE — Telephone Encounter (Signed)
 error

## 2024-02-10 ENCOUNTER — Encounter: Payer: 59 | Admitting: Physical Therapy

## 2024-02-17 ENCOUNTER — Encounter: Payer: 59 | Admitting: Physical Therapy

## 2024-02-24 ENCOUNTER — Encounter: Payer: 59 | Admitting: Physical Therapy

## 2024-03-02 ENCOUNTER — Encounter: Payer: 59 | Admitting: Physical Therapy

## 2024-03-23 ENCOUNTER — Encounter: Payer: 59 | Admitting: Physical Therapy

## 2024-03-30 ENCOUNTER — Encounter: Payer: 59 | Admitting: Physical Therapy

## 2024-04-05 ENCOUNTER — Encounter: Payer: 59 | Admitting: Physical Therapy

## 2024-04-06 ENCOUNTER — Encounter: Payer: 59 | Admitting: Physical Therapy

## 2024-04-13 ENCOUNTER — Encounter: Payer: 59 | Admitting: Physical Therapy

## 2024-04-20 ENCOUNTER — Encounter: Payer: 59 | Admitting: Physical Therapy

## 2024-04-26 ENCOUNTER — Encounter: Payer: 59 | Admitting: Physical Therapy

## 2024-04-27 ENCOUNTER — Encounter: Payer: 59 | Admitting: Physical Therapy

## 2024-05-04 ENCOUNTER — Encounter: Payer: 59 | Admitting: Physical Therapy

## 2024-05-11 ENCOUNTER — Encounter: Payer: 59 | Admitting: Physical Therapy

## 2024-05-18 ENCOUNTER — Encounter: Payer: 59 | Admitting: Physical Therapy

## 2024-05-25 ENCOUNTER — Encounter: Payer: 59 | Admitting: Physical Therapy

## 2024-10-01 ENCOUNTER — Emergency Department
Admission: EM | Admit: 2024-10-01 | Discharge: 2024-10-01 | Disposition: A | Discharge status: Home / Self Care | Attending: Emergency Medicine | Admitting: Emergency Medicine

## 2024-10-01 ENCOUNTER — Other Ambulatory Visit: Payer: Self-pay

## 2024-10-01 DIAGNOSIS — J029 Acute pharyngitis, unspecified: Secondary | ICD-10-CM | POA: Insufficient documentation

## 2024-10-01 LAB — RESP PANEL BY RT-PCR (RSV, FLU A&B, COVID)  RVPGX2
Influenza A by PCR: NEGATIVE
Influenza B by PCR: NEGATIVE
Resp Syncytial Virus by PCR: NEGATIVE
SARS Coronavirus 2 by RT PCR: NEGATIVE

## 2024-10-01 LAB — GROUP A STREP BY PCR: Group A Strep by PCR: NOT DETECTED

## 2024-10-01 MED ORDER — AMOXICILLIN-POT CLAVULANATE 875-125 MG PO TABS
1.0000 | ORAL_TABLET | Freq: Once | ORAL | Status: AC
  Administered 2024-10-01: 1 via ORAL
  Filled 2024-10-01: qty 1

## 2024-10-01 MED ORDER — AMOXICILLIN-POT CLAVULANATE 875-125 MG PO TABS: 1.0000 | ORAL_TABLET | Freq: Two times a day (BID) | ORAL | 0 refills | Status: AC

## 2024-10-01 NOTE — ED Notes (Signed)
 See triage note  Presents with sore throat  States this started yesterday  Denies any fever  Having some increased pain with swallowing

## 2024-10-01 NOTE — ED Triage Notes (Signed)
 Pt to ED for sore throat since yesterday. No other URI symptoms.

## 2024-10-01 NOTE — ED Provider Notes (Signed)
 [place] EMERGENCY DEPARTMENT AT [facility] Provider Note   CSN: [id number] Arrival date & time: 10/01/24  9170     Patient presents with: Sore Throat   Bradley Morrison is a 48 y.o. male presents to the emergency department for evaluation of sore throat.  Yesterday he developed a sore throat.  No fevers chills cough congestion or runny nose.  He denies any nausea or vomiting.  No difficulty swallowing, has been tolerating p.o. well.     Prior to Admission medications  Medication Sig Start Date End Date Taking? Authorizing Provider  amoxicillin -clavulanate (AUGMENTIN ) 875-125 MG tablet Take 1 tablet by mouth 2 (two) times daily for 10 days. 10/01/24 10/11/24 Yes Charlene Debby BROCKS, PA-C  acetaminophen  (TYLENOL ) 325 MG tablet Take 1-2 tablets (325-650 mg total) by mouth every 4 (four) hours as needed for mild pain. 06/03/23   Love, Sharlet RAMAN, PA-C  amLODipine  (NORVASC ) 10 MG tablet Take 10 mg by mouth daily. 08/13/23   [provider]  atorvastatin  (LIPITOR ) 80 MG tablet Take 1 tablet (80 mg total) by mouth daily. 06/11/23 07/11/23  Love, Sharlet RAMAN, PA-C  cetirizine  (ZYRTEC  ALLERGY) 10 MG tablet Take 1 tablet (10 mg total) by mouth 2 (two) times daily as needed for allergies. 07/31/23 07/30/24  Cyrena Mylar, MD  clopidogrel  (PLAVIX ) 75 MG tablet Take 1 tablet (75 mg total) by mouth daily. 06/11/23   Love, Sharlet RAMAN, PA-C  dapagliflozin  propanediol (FARXIGA ) 10 MG TABS tablet Take 1 tablet (10 mg total) by mouth daily. 06/11/23   Love, Sharlet RAMAN, PA-C  diphenhydrAMINE  (BENADRYL ) 25 MG tablet Take 1 tablet (25 mg total) by mouth every 6 (six) hours for 3 days. 11/03/23 11/06/23  Viviann Pastor, MD  docusate sodium  (COLACE) 100 MG capsule Take 2 capsules (200 mg total) by mouth 2 (two) times daily. 06/11/23   Love, Sharlet RAMAN, PA-C  EPINEPHrine  0.3 mg/0.3 mL IJ SOAJ injection Inject 0.3 mg into the muscle as needed. 07/31/23   Cyrena Mylar, MD  gabapentin  (NEURONTIN ) 300 MG capsule Take 1  capsule (300 mg total) by mouth 3 (three) times daily. 06/11/23   Love, Sharlet RAMAN, PA-C  gabapentin  (NEURONTIN ) 300 MG capsule Take by mouth.    [provider]  magnesium  oxide (MAG-OX) 400 MG tablet Take 0.5 tablets (200 mg total) by mouth at bedtime. 06/11/23   Love, Sharlet RAMAN, PA-C  metFORMIN  (GLUCOPHAGE ) 500 MG tablet Take 1 tablet (500 mg total) by mouth daily with breakfast. 06/11/23   Love, Sharlet RAMAN, PA-C  polyethylene glycol powder (GLYCOLAX /MIRALAX ) 17 GM/SCOOP powder Take 17 g by mouth daily. 06/11/23   Love, Sharlet RAMAN, PA-C  Tiotropium Bromide-Olodaterol (STIOLTO RESPIMAT ) 2.5-2.5 MCG/ACT AERS Inhale 2 puffs into the lungs daily at 6 (six) AM. 06/11/23   Love, Sharlet RAMAN, PA-C  traZODone  (DESYREL ) 50 MG tablet Take 0.5-1 tablets (25-50 mg total) by mouth at bedtime as needed for sleep. 06/11/23   Love, Sharlet RAMAN, PA-C  trolamine salicylate (ASPERCREME) 10 % cream Apply topically 2 (two) times daily as needed for muscle pain. To left hip 06/11/23   Love, Sharlet RAMAN, PA-C    Allergies: Zestril [lisinopril]    Review of Systems  Updated Vital Signs BP (!) 140/98 (BP Location: Left Arm)   Pulse 100   Temp 98.3 F (36.8 C) (Oral)   Resp 20   Ht 5' 6 (1.676 m)   Wt 99.8 kg   SpO2 95%   BMI 35.51 kg/m   Physical Exam Constitutional:  Appearance: He is well-developed.  HENT:     Head: Normocephalic and atraumatic.     Mouth/Throat:     Pharynx: Uvula midline. Posterior oropharyngeal erythema present. No pharyngeal swelling, oropharyngeal exudate or uvula swelling.     Comments: Mild pharyngeal erythema bilaterally.  No signs of peritonsillar abscess, no significant swelling.  Uvula midline.  No trismus Eyes:     Conjunctiva/sclera: Conjunctivae normal.  Cardiovascular:     Rate and Rhythm: Normal rate and regular rhythm.     Heart sounds: Normal heart sounds.  Pulmonary:     Effort: Pulmonary effort is normal. No respiratory distress.  Musculoskeletal:        General:  Normal range of motion.     Cervical back: Normal range of motion.  Skin:    General: Skin is warm.     Findings: No rash.  Neurological:     General: No focal deficit present.     Mental Status: He is alert and oriented to person, place, and time.  Psychiatric:        Behavior: Behavior normal.        Thought Content: Thought content normal.     (all labs ordered are listed, but only abnormal results are displayed) Labs Reviewed  GROUP A STREP BY PCR  RESP PANEL BY RT-PCR (RSV, FLU A&B, COVID)  [id number]    EKG: None  Radiology: No results found.   Procedures   Medications Ordered in the ED  amoxicillin -clavulanate (AUGMENTIN ) 875-125 MG per tablet 1 tablet (has no administration in time range)                                    Medical Decision Making Risk Prescription drug management.   48 year old male with pharyngeal erythema, sore throat x 1 day.  No other cough or cold symptoms.  No signs of peritonsillar abscess.  His vital signs are stable, afebrile and he is tolerating p.o. well.  Based on strep score, we will treat with Augmentin  for strep.  Test are pending.  He appears well and stable for discharge.  Will discharge home with strict return precautions he understands signs symptoms return to the ER for Final diagnoses:  Pharyngitis, unspecified etiology    ED Discharge Orders          Ordered    amoxicillin -clavulanate (AUGMENTIN ) 875-125 MG tablet  2 times daily        10/01/24 0851               Charlene Debby BROCKS, PA-C 10/01/24 9144    Floy Roberts, MD 10/01/24 1025

## 2024-10-01 NOTE — Discharge Instructions (Addendum)
 Please alternate Tylenol  and ibuprofen as needed for pain.  Make sure you are drinking lots of fluids.  Take antibiotics as prescribed.  Return to the ER for any difficulty swallowing, increased swelling, fevers above 101 that are not going down with Tylenol  or ibuprofen, worsening symptoms or urgent changes in your health
